# Patient Record
Sex: Female | Born: 1960 | Race: Black or African American | Hispanic: No | Marital: Married
Health system: Midwestern US, Academic
[De-identification: ages and names within clinical notes are randomized; demographics above are authoritative.]

## PROBLEM LIST (undated history)

## (undated) DIAGNOSIS — F32A Depression, unspecified: Secondary | ICD-10-CM

## (undated) DIAGNOSIS — F329 Major depressive disorder, single episode, unspecified: Secondary | ICD-10-CM

## (undated) DIAGNOSIS — J189 Pneumonia, unspecified organism: Secondary | ICD-10-CM

## (undated) DIAGNOSIS — F319 Bipolar disorder, unspecified: Secondary | ICD-10-CM

## (undated) DIAGNOSIS — E119 Type 2 diabetes mellitus without complications: Secondary | ICD-10-CM

## (undated) DIAGNOSIS — I959 Hypotension, unspecified: Secondary | ICD-10-CM

## (undated) DIAGNOSIS — D689 Coagulation defect, unspecified: Secondary | ICD-10-CM

## (undated) DIAGNOSIS — C801 Malignant (primary) neoplasm, unspecified: Secondary | ICD-10-CM

## (undated) DIAGNOSIS — K219 Gastro-esophageal reflux disease without esophagitis: Secondary | ICD-10-CM

## (undated) DIAGNOSIS — F419 Anxiety disorder, unspecified: Secondary | ICD-10-CM

## (undated) DIAGNOSIS — R0602 Shortness of breath: Secondary | ICD-10-CM

## (undated) DIAGNOSIS — I82409 Acute embolism and thrombosis of unspecified deep veins of unspecified lower extremity: Secondary | ICD-10-CM

## (undated) DIAGNOSIS — M1990 Unspecified osteoarthritis, unspecified site: Secondary | ICD-10-CM

## (undated) DIAGNOSIS — I10 Essential (primary) hypertension: Secondary | ICD-10-CM

## (undated) HISTORY — PX: ABLATION: SHX5711

## (undated) HISTORY — PX: TUBAL LIGATION: SHX77

## (undated) HISTORY — PX: CHOLECYSTECTOMY: SHX55

## (undated) HISTORY — PX: CARPAL TUNNEL RELEASE: SHX101

## (undated) HISTORY — DX: Coagulation defect, unspecified: D68.9

---

## 2009-06-22 NOTE — ED Provider Notes (Unsigned)
PATIENT NAME                  PA #             MR #                  Valerie Thompson, Valerie Thompson               [id number]       [id number]            EMERGENCY ROOM PHYSICIAN                 ADM DATE                     Burr Medico, MD                        06/22/2009                   DATE OF BIRTH    AGE            PATIENT TYPE      RM #               1960/12/17       48             ERK                                        REASON FOR VISIT:  Shortness of breath.     HISTORY OF PRESENT ILLNESS:  The patient is a 48 year old who presents to the  emergency department with shortness of breath and a productive cough going on  for about 3 days.  Denies having nausea, vomiting, diarrhea.  No fevers or  chills.  No headaches.  No back pain.  Denies chest pain.  A little bit of  abdominal cramping that comes and goes, but none currently.  No leg pain,  swelling, or rashes.     PAST MEDICAL HISTORY:  Significant for mental illness, diabetes,  osteoarthritis, hypertension.     MEDICATIONS:  Seroquel and others; she is not sure of them.     ALLERGIES:  PENICILLIN, ASPIRIN, and, IBUPROFEN.     SOCIAL HISTORY:  The patient does smoke and does drink.     FAMILY HISTORY:  Noncontributory.     REVIEW OF SYSTEMS:    CONSITUTIONAL:  No fevers or chills.  HEENT:  No headache, no neck pain.    MUSCULOSKELETAL:  No back pain.  No leg pain, swelling, or rashes.  GU:  No burning or discomfort with urination.     PHYSICAL EXAMINATION:   VITAL SIGNS:  Temperature is 98.9, pulse is 110, respirations 18, blood  pressure 107/78, saturating 96% on room air.  GENERAL:  She is awake, alert, well-developed, well-nourished.  She is in no  obvious distress.  HEENT:  Normocephalic.  Ears, nose, and throat were clear.  NECK:  Supple.  HEART:  Slightly tachycardic.  LUNGS:  Diminished bilaterally, some wheezing throughout.  ABDOMEN:  Soft, nontender, nondistended.  Positive bowel sounds.  EXTREMITIES:  No clubbing.  No cyanosis.  No edema.    NEUROLOGIC:   She is neurologically intact.     LABORATORY DATA:  WBC 17, hemoglobin 13 and hematocrit 42, platelets are 205.   Electrolytes unremarkable.  Amylase and lipase were normal.      EMERGENCY DEPARTMENT COURSE:  Chest x-ray with increased _____ right lower  lobe.     ASSESSMENT:  A 48 year old with right lower lobe pneumonia, likely right  lower lobe infiltrate; will give her Avelox with _____.  She is feeling much  better.  I am going to go ahead and send her home with codeine, an albuterol  inhaler, and _____.  Instructed to follow up.                                 Burr Medico, MD     Ernestina Penna /[id number]  DD: 06/22/2009 21:06  DT: 06/22/2009 23:52  Job #: [id number]

## 2009-06-23 LAB — COMPREHENSIVE METABOLIC PANEL
ALT: 18 U/L (ref 10–40)
AST: 22 U/L (ref 15–37)
Albumin/Globulin Ratio: 1.2 (ref 1.1–2.2)
Albumin: 4.7 g/dL (ref 3.4–5.0)
Alkaline Phosphatase: 47 U/L
BUN: 15 mg/dL (ref 7–18)
CO2: 28 meq/L (ref 21–32)
Calcium: 10.5 mg/dL (ref 8.3–10.6)
Chloride: 99 meq/L (ref 99–110)
Creatinine: 1 mg/dL (ref 0.6–1.1)
GFR Est, African/Amer: >60
GFR, Estimated: >60 (ref >60)
Glucose: 93 mg/dL (ref 70–99)
Potassium: 4.1 meq/L (ref 3.5–5.1)
Sodium: 137 meq/L (ref 136–145)
Total Bilirubin: 0.3 mg/dL (ref 0.0–1.0)
Total Protein: 8.7 g/dL — ABNORMAL HIGH (ref 6.4–8.2)

## 2009-06-23 LAB — CBC WITH AUTO DIFFERENTIAL
Eosinophils %: 2 % (ref 0.0–5.0)
Eosinophils Absolute: 0.3 10*3 (ref 0.0–0.6)
Granulocyte Absolute Count: 10.4 10*3 — ABNORMAL HIGH (ref 1.7–7.7)
Hematocrit: 42 % (ref 36.0–48.0)
Hemoglobin: 13.6 g/dL (ref 12.0–16.0)
Lymphocytes %: 29 % (ref 25.0–40.0)
Lymphocytes Absolute: 5 10*3 (ref 1.0–5.1)
MCH: 24.6 pg — ABNORMAL LOW (ref 26–34)
MCHC: 32.5 g/dL (ref 31–36)
MCV: 75.7 fl — ABNORMAL LOW (ref 80–100)
MPV: 8.2 fl (ref 5.0–10.5)
Monocytes %: 8 % (ref 0.0–8.0)
Monocytes Absolute: 1.4 10*3 — ABNORMAL HIGH (ref 0.0–0.95)
Platelets: 205 10*3 (ref 135–450)
RBC Morphology: NORMAL
RBC: 5.55 10*6 — ABNORMAL HIGH (ref 4.0–5.2)
RDW: 15 % — ABNORMAL HIGH (ref 11.5–14.5)
Segs Relative: 61 % (ref 42.0–63.0)
WBC Morphology: NORMAL
WBC: 17.1 10*3 — ABNORMAL HIGH (ref 4.0–11.0)

## 2009-06-23 LAB — LIPASE: Lipase: 28.47 U/L (ref 5.6–51.3)

## 2009-06-23 LAB — AMYLASE: Amylase: 52 U/L (ref 25–115)

## 2009-11-29 ENCOUNTER — Inpatient Hospital Stay

## 2009-11-30 NOTE — Unmapped (Signed)
THE [facility]                                 [address]                             [place], [place] [address]       PATIENT NAME:   Kathy Camacho, Kathy Camacho                MR #:  [id number]   DATE OF BIRTH:  Jul 09, 1961                        ACCOUNT #:  [id number]   PRIMARY:        Dorian Furnace, M.D.          ROOM #:   REFERRING:      Dorian Furnace, M.D.          NURSING UNIT:   DICTATED BY:    Lynden Oxford, PA-C         FC:  D   VISIT DATE:                                       ADMIT DATE:  11/29/2009                                                     DISCHARGE DATE:                                       CLINIC NOTE     *-*-*   CHIEF COMPLAINT: Right knee pain.     HISTORY OF PRESENT ILLNESS:  The patient is a 48 year old African-American   female who comes to the orthopedic joint clinic today for evaluation of her   right knee pain.  The patient states that her pain has been occurring for   about the past year. She states that it started suddenly while she was   walking up and down the stairs repeatedly. She said initially it felt like a   tearing sensation.  She notes that the pain has been off and on over the past   year and it comes and goes and varies in intensity. The pain is located all   over the front of the knee.  At times it radiates up the knee to the hip and   occasionally down to the lower leg. She currently describes it as a pressure   like sensation, rates it a 5/10 but states she did take some medication this   morning for pain relief. She notes it is difficult to lie on her right side   due to the pain.  Her pain is worse with standing, walking and stair   climbing.  The pain is improved with rest and pain medication.  She was given  a prescription by her primary care physician for Percocet and she has also   tried Vicodin in the past both with relief. She has tried heat and warm  baths   with little relief.  She complains of numbness and tingling of bilateral feet   and arms. She notes that her right leg is weaker and occasionally feels like   it will give out on her. She notes that she also was given a sample of some   kind of liquid that she pours over her knee. She states this decreases the   pain but increases the pressure. The patient was unsure of the name of this   liquid.  She notes she has had no other treatment for her knee pain.  No   physical therapy and no surgical treatment except for one cortisone injection   about 6-7 years ago.  She states at that time she was told she had arthritis   in her knee. She had the injection.  Her pain went away until about a year   ago.  This pain feels similar to that pain in the past.  The patient states   today that she does not want another cortisone injection.     PAST MEDICAL HISTORY:     1. Hypertension.   2. Diabetes.   3. Manic depression.   4. History of DVT.     MEDICATIONS:     1. Actos.   2. Metformin.   3. Seroquel.   4. Aspirin.   5. Lantus injections.     PAST SURGICAL HISTORY:     1. Cholecystectomy.   2. Tubal ligation.     ALLERGIES:     1.  Penicillin (hives, difficulty breathing).     SOCIAL HISTORY: The patient smokes four Black and Milds a day and has been   smoking off and on for several years.  She drinks alcohol occasionally and   smokes marijuana daily.  She does not work. She is on social security   disability for her manic depression.     PHYSICAL EXAMINATION:     GENERAL: The patient is moderately obese, well-developed, appears stated age,   sitting on exam table in no acute distress.   MUSCULOSKELETAL: The patient ambulates with normal gait and balance.  On   inspection of the right knee there is no erythema, edema, cellulitis or   ecchymosis.  There is no joint effusion noted.  The patient is tender to   palpation along the lateral patella and tibial patella tendon.  She has full   range of motion of the right knee  with full extension to flexion of 120   degrees with minimal pain. She has a negative anterior drawer test, negative   Lachman's and McBurney's.  No laxity noted on varus or valgus stressing. She   has 5/5 motor strength with right knee flexion and extension and ankle   dorsiflexion and plantar flexion.  Gross sensation is intact distally and   proximally throughout the right lower extremity to light touch.  She has   palpable posterior tibial pulses.  No abnormal warmth of the skin.     X-RAYS: AP and lateral right knee films show no fractures noted.  Minimally   decreased joint space.  No effusion noted.  Inferior patella osteophytes.     ASSESSMENT:     1. Right knee patellar tendonitis.     PLAN:     1. Discussed the above with the  patient saying that we would like her to     start some formal physical therapy.   2. I offered her a steroid injection today but the patient declined.   3. Explained she should take over-the-counter NSAIDS as needed for her pain.     She explains that she did get a prescription in the past from her primary     care physician which she thinks was Naprosyn.  She states she only took one     and stopped taking them because she was afraid to be on any medication.     She states that she will call her primary care physician to see if she     should restart this medication if it is Naprosyn.   4. Recommended that she try ice and heat as needed.   5. We will have her follow-up in three months and the patient states she may     consider an injection at that time.     Answered all the patient's questions to her satisfaction and understanding.   She is in agreement with plan.     *-*-*                                             _______________________________________   ES/saw                                 _____   D:  11/29/2009 14:42                   Lynden Oxford, PA-C   T:  11/30/2009 15:13   Job #:  [id number]                                           CLINIC NOTE                                                                 PAGE    1 of   1                                                                PAGE    1 of   1

## 2009-12-13 ENCOUNTER — Inpatient Hospital Stay

## 2010-02-28 ENCOUNTER — Inpatient Hospital Stay

## 2012-06-07 NOTE — Telephone Encounter (Addendum)
The pharmacist states that the patients Hydrochlorothiazide and metformin need to be filled. She states to fax the medication refills to [phone].    Valerie Thompson)

## 2012-08-05 NOTE — Telephone Encounter (Signed)
Pt. Needs refill on bd pen needles and lorazepam 0.5mg .

## 2012-10-02 ENCOUNTER — Emergency Department (HOSPITAL_COMMUNITY)
Admission: EM | Admit: 2012-10-02 | Discharge: 2012-10-02 | Disposition: A | Discharge status: Home / Self Care | Payer: Medicaid Other | Attending: Emergency Medicine | Admitting: Emergency Medicine

## 2012-10-02 ENCOUNTER — Encounter (HOSPITAL_COMMUNITY): Payer: Self-pay | Admitting: Physical Medicine and Rehabilitation

## 2012-10-02 DIAGNOSIS — M19039 Primary osteoarthritis, unspecified wrist: Secondary | ICD-10-CM | POA: Insufficient documentation

## 2012-10-02 DIAGNOSIS — Z7982 Long term (current) use of aspirin: Secondary | ICD-10-CM | POA: Insufficient documentation

## 2012-10-02 DIAGNOSIS — Z794 Long term (current) use of insulin: Secondary | ICD-10-CM | POA: Insufficient documentation

## 2012-10-02 DIAGNOSIS — I10 Essential (primary) hypertension: Secondary | ICD-10-CM | POA: Insufficient documentation

## 2012-10-02 DIAGNOSIS — M1990 Unspecified osteoarthritis, unspecified site: Secondary | ICD-10-CM

## 2012-10-02 DIAGNOSIS — Z79899 Other long term (current) drug therapy: Secondary | ICD-10-CM | POA: Insufficient documentation

## 2012-10-02 DIAGNOSIS — E119 Type 2 diabetes mellitus without complications: Secondary | ICD-10-CM | POA: Insufficient documentation

## 2012-10-02 HISTORY — DX: Essential (primary) hypertension: I10

## 2012-10-02 HISTORY — DX: Unspecified osteoarthritis, unspecified site: M19.90

## 2012-10-02 HISTORY — DX: Type 2 diabetes mellitus without complications: E11.9

## 2012-10-02 MED ORDER — OXYCODONE-ACETAMINOPHEN 5-325 MG PO TABS
2.0000 | ORAL_TABLET | Freq: Once | ORAL | Status: AC
  Administered 2012-10-02: 2 via ORAL
  Filled 2012-10-02: qty 2

## 2012-10-02 MED ORDER — OXYCODONE-ACETAMINOPHEN 5-325 MG PO TABS: 2.0000 | ORAL_TABLET | ORAL | Status: DC | PRN

## 2012-10-02 MED ORDER — PREDNISONE 10 MG PO TABS: 20.0000 mg | ORAL_TABLET | Freq: Every day | ORAL | Status: DC

## 2012-10-02 MED ORDER — PREDNISONE 20 MG PO TABS
60.0000 mg | ORAL_TABLET | Freq: Once | ORAL | Status: AC
  Administered 2012-10-02: 60 mg via ORAL
  Filled 2012-10-02: qty 3

## 2012-10-02 NOTE — ED Provider Notes (Addendum)
History   This chart was scribed for Toy Baker, MD by Charolett Bumpers . The patient was seen in room TR09C/TR09C. Patient's care was started at 1249.   CSN: [id number] Arrival date & time 10/02/12  1229  First MD Initiated Contact with Patient 10/02/12 1249      Chief Complaint  Patient presents with  . Arm Pain    The history is provided by the patient. No language interpreter was used.   Julianna Vanwagner is a 51 y.o. female who presents to the Emergency Department complaining of right wrist pain that radiates to elbow that started 2 days ago. She reports associated swelling. She reports her symptoms are worsening. She denies any known injuries. 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    Past Medical History  Diagnosis Date  . Hypertension   . Arthritis   . Diabetes mellitus without complication     No past surgical history on file.  No family history on file.  History  Substance Use Topics  . Smoking status: Never Smoker   . Smokeless tobacco: Not on file  . Alcohol Use: No    OB History    Grav Para Term Preterm Abortions TAB SAB Ect Mult Living                  Review of Systems  Constitutional: Negative for fever and chills.  Respiratory: Negative for shortness of breath.   Gastrointestinal: Negative for nausea and vomiting.  Musculoskeletal: Positive for joint swelling and arthralgias.       Right wrist pain and swelling.  Neurological: Negative for weakness.  All other systems reviewed and are negative.    Allergies  Aspirin; Ibuprofen; and Penicillins  Home Medications   Current Outpatient Rx  Name Route Sig Dispense Refill  . ASPIRIN EC 81 MG PO TBEC Oral Take 81 mg by mouth daily.    Marland Kitchen GLIPIZIDE 5 MG PO TABS Oral Take 10 mg by mouth 2 (two) times daily before a meal.    . HYDROCHLOROTHIAZIDE 25 MG PO TABS Oral Take 25 mg by mouth daily.    . INDOMETHACIN 50 MG PO CAPS  Oral Take 50 mg by mouth 2 (two) times daily with a meal.    . INSULIN GLARGINE 100 UNIT/ML [place] SOLN Subcutaneous Inject 20 Units into the skin at bedtime.    Marland Kitchen LISINOPRIL 40 MG PO TABS Oral Take 40 mg by mouth daily.    Marland Kitchen LORAZEPAM 0.5 MG PO TABS Oral Take 0.5 mg by mouth 2 (two) times daily as needed. For nerves    . METFORMIN HCL 500 MG PO TABS Oral Take 1,000 mg by mouth 2 (two) times daily with a meal.    . PIOGLITAZONE HCL 30 MG PO TABS Oral Take 30 mg by mouth daily.      BP 186/82  Pulse 76  Temp 97.7 F (36.5 C) (Oral)  Resp 18  SpO2 95%  Physical Exam  Nursing note and vitals reviewed. Constitutional: She is oriented to person, place, and time. She appears well-developed and well-nourished.  Non-toxic appearance.  HENT:  Head: Normocephalic and atraumatic.  Eyes: Conjunctivae normal are normal. Pupils are equal, round, and reactive to light.  Neck: Normal range of motion.  Cardiovascular: Normal rate.   Pulmonary/Chest: Effort normal.  Musculoskeletal: She exhibits edema and tenderness.       Right wrist with edema and tenderness with  ROM. Skin intact. No erythema. Neurovascularly intact in right hand.   Neurological: She is alert and oriented to person, place, and time.  Skin: Skin is warm and dry.  Psychiatric: She has a normal mood and affect.    ED Course  Procedures (including critical care time)  DIAGNOSTIC STUDIES: Oxygen Saturation is 95% on room air, adequate by my interpretation.    COORDINATION OF CARE:  12:59-Discussed planned course of treatment with the patient including treatment with pain medication and prednisone, who is agreeable at this time.   13:15-Medication Orders: Oxycodone-acetaminophen (Percocet/Roxicet) 5-325 mg per tablet 2 tablet-once; Prednisone (Deltasone) tablet 60 mg-once.    Labs Reviewed - No data to display No results found.   No diagnosis found.    MDM  As above  I personally performed the services described in this  documentation, which was scribed in my presence. The recorded information has been reviewed and is accurate.       Toy Baker, MD 10/18/12 1610  Toy Baker, MD 12/11/12 [phone]

## 2012-10-02 NOTE — ED Notes (Signed)
Pt presents to department for evaluation of R arm pain. Ongoing x2 days. Pt reports swelling and increased pain. 10/10 upon arrival. Denies recent injury.

## 2013-07-29 NOTE — Telephone Encounter (Signed)
Mailed health maintaince letter to pt that she is due for mammagram and colonscopy   The letter came back we have wrong address tried to reach pt to find new address her phone keeps sayin not avalable at this time  Unable to reach pt

## 2013-09-22 ENCOUNTER — Encounter: Payer: Self-pay | Admitting: Obstetrics

## 2013-10-21 ENCOUNTER — Ambulatory Visit: Payer: Self-pay | Admitting: Advanced Practice Midwife

## 2014-03-25 ENCOUNTER — Encounter: Payer: Self-pay | Admitting: Advanced Practice Midwife

## 2014-04-21 ENCOUNTER — Ambulatory Visit: Payer: Medicaid Other | Admitting: Advanced Practice Midwife

## 2014-08-12 ENCOUNTER — Inpatient Hospital Stay (HOSPITAL_COMMUNITY)
Admission: EM | Admit: 2014-08-12 | Discharge: 2014-08-13 | DRG: 181 | Disposition: A | Discharge status: Home / Self Care | Payer: Medicaid Other | Attending: Internal Medicine | Admitting: Internal Medicine

## 2014-08-12 ENCOUNTER — Emergency Department (HOSPITAL_COMMUNITY): Payer: Medicaid Other

## 2014-08-12 ENCOUNTER — Encounter (HOSPITAL_COMMUNITY): Payer: Self-pay | Admitting: Emergency Medicine

## 2014-08-12 DIAGNOSIS — Z7982 Long term (current) use of aspirin: Secondary | ICD-10-CM

## 2014-08-12 DIAGNOSIS — D3502 Benign neoplasm of left adrenal gland: Secondary | ICD-10-CM

## 2014-08-12 DIAGNOSIS — R222 Localized swelling, mass and lump, trunk: Secondary | ICD-10-CM

## 2014-08-12 DIAGNOSIS — E119 Type 2 diabetes mellitus without complications: Secondary | ICD-10-CM | POA: Diagnosis present

## 2014-08-12 DIAGNOSIS — C342 Malignant neoplasm of middle lobe, bronchus or lung: Principal | ICD-10-CM | POA: Diagnosis present

## 2014-08-12 DIAGNOSIS — R918 Other nonspecific abnormal finding of lung field: Secondary | ICD-10-CM

## 2014-08-12 DIAGNOSIS — Z794 Long term (current) use of insulin: Secondary | ICD-10-CM

## 2014-08-12 DIAGNOSIS — M129 Arthropathy, unspecified: Secondary | ICD-10-CM | POA: Diagnosis present

## 2014-08-12 DIAGNOSIS — M542 Cervicalgia: Secondary | ICD-10-CM | POA: Diagnosis present

## 2014-08-12 DIAGNOSIS — Z6841 Body Mass Index (BMI) 40.0 and over, adult: Secondary | ICD-10-CM

## 2014-08-12 DIAGNOSIS — J449 Chronic obstructive pulmonary disease, unspecified: Secondary | ICD-10-CM | POA: Diagnosis present

## 2014-08-12 DIAGNOSIS — M549 Dorsalgia, unspecified: Secondary | ICD-10-CM | POA: Diagnosis present

## 2014-08-12 DIAGNOSIS — R911 Solitary pulmonary nodule: Secondary | ICD-10-CM | POA: Diagnosis present

## 2014-08-12 DIAGNOSIS — J4489 Other specified chronic obstructive pulmonary disease: Secondary | ICD-10-CM | POA: Diagnosis present

## 2014-08-12 DIAGNOSIS — R599 Enlarged lymph nodes, unspecified: Secondary | ICD-10-CM | POA: Diagnosis present

## 2014-08-12 DIAGNOSIS — G609 Hereditary and idiopathic neuropathy, unspecified: Secondary | ICD-10-CM | POA: Diagnosis present

## 2014-08-12 DIAGNOSIS — C3490 Malignant neoplasm of unspecified part of unspecified bronchus or lung: Secondary | ICD-10-CM | POA: Diagnosis present

## 2014-08-12 DIAGNOSIS — I10 Essential (primary) hypertension: Secondary | ICD-10-CM

## 2014-08-12 DIAGNOSIS — IMO0001 Reserved for inherently not codable concepts without codable children: Secondary | ICD-10-CM

## 2014-08-12 DIAGNOSIS — D3501 Benign neoplasm of right adrenal gland: Secondary | ICD-10-CM

## 2014-08-12 DIAGNOSIS — F17200 Nicotine dependence, unspecified, uncomplicated: Secondary | ICD-10-CM | POA: Diagnosis present

## 2014-08-12 DIAGNOSIS — E669 Obesity, unspecified: Secondary | ICD-10-CM | POA: Diagnosis present

## 2014-08-12 LAB — CBC WITH DIFFERENTIAL/PLATELET
BASOS ABS: 0 10*3/uL (ref 0.0–0.1)
Basophils Relative: 0 % (ref 0–1)
Eosinophils Absolute: 0.2 10*3/uL (ref 0.0–0.7)
Eosinophils Relative: 1 % (ref 0–5)
HEMATOCRIT: 39.8 % (ref 36.0–46.0)
Hemoglobin: 13.3 g/dL (ref 12.0–15.0)
LYMPHS PCT: 32 % (ref 12–46)
Lymphs Abs: 4.2 10*3/uL — ABNORMAL HIGH (ref 0.7–4.0)
MCH: 24.2 pg — ABNORMAL LOW (ref 26.0–34.0)
MCHC: 33.4 g/dL (ref 30.0–36.0)
MCV: 72.5 fL — ABNORMAL LOW (ref 78.0–100.0)
Monocytes Absolute: 0.6 10*3/uL (ref 0.1–1.0)
Monocytes Relative: 5 % (ref 3–12)
NEUTROS ABS: 8 10*3/uL — AB (ref 1.7–7.7)
Neutrophils Relative %: 62 % (ref 43–77)
PLATELETS: 219 10*3/uL (ref 150–400)
RBC: 5.49 MIL/uL — ABNORMAL HIGH (ref 3.87–5.11)
RDW: 14.2 % (ref 11.5–15.5)
WBC: 12.9 10*3/uL — AB (ref 4.0–10.5)

## 2014-08-12 LAB — BASIC METABOLIC PANEL
ANION GAP: 13 (ref 5–15)
BUN: 14 mg/dL (ref 6–23)
CHLORIDE: 95 meq/L — AB (ref 96–112)
CO2: 26 meq/L (ref 19–32)
Calcium: 9.5 mg/dL (ref 8.4–10.5)
Creatinine, Ser: 0.78 mg/dL (ref 0.50–1.10)
GFR calc Af Amer: >90 mL/min (ref >90)
GFR calc non Af Amer: >90 mL/min (ref >90)
Glucose, Bld: 334 mg/dL — ABNORMAL HIGH (ref 70–99)
Potassium: 3.6 mEq/L — ABNORMAL LOW (ref 3.7–5.3)
SODIUM: 134 meq/L — AB (ref 137–147)

## 2014-08-12 LAB — I-STAT TROPONIN, ED: TROPONIN I, POC: 0 ng/mL (ref 0.00–0.08)

## 2014-08-12 MED ORDER — INSULIN GLARGINE 100 UNIT/ML ~~LOC~~ SOLN
35.0000 [IU] | Freq: Every day | SUBCUTANEOUS | Status: DC
  Administered 2014-08-12: 35 [IU] via SUBCUTANEOUS
  Filled 2014-08-12 (×2): qty 0.35

## 2014-08-12 MED ORDER — LISINOPRIL 40 MG PO TABS
40.0000 mg | ORAL_TABLET | Freq: Every day | ORAL | Status: DC
  Administered 2014-08-12 – 2014-08-13 (×2): 40 mg via ORAL
  Filled 2014-08-12: qty 1
  Filled 2014-08-12: qty 2

## 2014-08-12 MED ORDER — OXYCODONE-ACETAMINOPHEN 5-325 MG PO TABS
1.0000 | ORAL_TABLET | Freq: Once | ORAL | Status: AC
  Administered 2014-08-12: 1 via ORAL
  Filled 2014-08-12: qty 1

## 2014-08-12 MED ORDER — GLIPIZIDE 10 MG PO TABS
10.0000 mg | ORAL_TABLET | Freq: Two times a day (BID) | ORAL | Status: DC
  Administered 2014-08-13 (×2): 10 mg via ORAL
  Filled 2014-08-12 (×3): qty 1

## 2014-08-12 MED ORDER — LORAZEPAM 0.5 MG PO TABS
0.5000 mg | ORAL_TABLET | Freq: Two times a day (BID) | ORAL | Status: DC | PRN
  Administered 2014-08-12: 0.5 mg via ORAL
  Filled 2014-08-12: qty 1

## 2014-08-12 MED ORDER — PIOGLITAZONE HCL 30 MG PO TABS
30.0000 mg | ORAL_TABLET | Freq: Every day | ORAL | Status: DC
  Administered 2014-08-13: 30 mg via ORAL
  Filled 2014-08-12: qty 1

## 2014-08-12 MED ORDER — INDOMETHACIN 50 MG PO CAPS
50.0000 mg | ORAL_CAPSULE | Freq: Two times a day (BID) | ORAL | Status: DC
  Administered 2014-08-13: 50 mg via ORAL
  Filled 2014-08-12 (×3): qty 1

## 2014-08-12 MED ORDER — METFORMIN HCL 500 MG PO TABS
1000.0000 mg | ORAL_TABLET | Freq: Two times a day (BID) | ORAL | Status: DC
  Administered 2014-08-13: 1000 mg via ORAL
  Filled 2014-08-12 (×4): qty 2

## 2014-08-12 MED ORDER — LORAZEPAM 1 MG PO TABS
1.0000 mg | ORAL_TABLET | Freq: Once | ORAL | Status: AC
  Administered 2014-08-12: 1 mg via ORAL
  Filled 2014-08-12: qty 1

## 2014-08-12 MED ORDER — ASPIRIN EC 81 MG PO TBEC
81.0000 mg | DELAYED_RELEASE_TABLET | Freq: Every day | ORAL | Status: DC
  Administered 2014-08-12 – 2014-08-13 (×2): 81 mg via ORAL
  Filled 2014-08-12 (×2): qty 1

## 2014-08-12 MED ORDER — GABAPENTIN 100 MG PO CAPS
100.0000 mg | ORAL_CAPSULE | Freq: Three times a day (TID) | ORAL | Status: DC
  Administered 2014-08-12 – 2014-08-13 (×3): 100 mg via ORAL
  Filled 2014-08-12 (×4): qty 1

## 2014-08-12 MED ORDER — QUETIAPINE FUMARATE 50 MG PO TABS
50.0000 mg | ORAL_TABLET | Freq: Every day | ORAL | Status: DC
  Administered 2014-08-12: 50 mg via ORAL
  Filled 2014-08-12 (×2): qty 1

## 2014-08-12 MED ORDER — HEPARIN SODIUM (PORCINE) 5000 UNIT/ML IJ SOLN
5000.0000 [IU] | Freq: Three times a day (TID) | INTRAMUSCULAR | Status: DC
  Administered 2014-08-12 – 2014-08-13 (×3): 5000 [IU] via SUBCUTANEOUS
  Filled 2014-08-12 (×6): qty 1

## 2014-08-12 MED ORDER — CYCLOBENZAPRINE HCL 10 MG PO TABS
10.0000 mg | ORAL_TABLET | Freq: Once | ORAL | Status: AC
  Administered 2014-08-12: 10 mg via ORAL
  Filled 2014-08-12: qty 1

## 2014-08-12 MED ORDER — ADULT MULTIVITAMIN W/MINERALS CH
1.0000 | ORAL_TABLET | Freq: Every day | ORAL | Status: DC
  Administered 2014-08-12 – 2014-08-13 (×2): 1 via ORAL
  Filled 2014-08-12 (×2): qty 1

## 2014-08-12 MED ORDER — HYDROCHLOROTHIAZIDE 25 MG PO TABS
25.0000 mg | ORAL_TABLET | Freq: Every day | ORAL | Status: DC
  Administered 2014-08-12 – 2014-08-13 (×2): 25 mg via ORAL
  Filled 2014-08-12 (×2): qty 1

## 2014-08-12 NOTE — H&P (Signed)
[facility] History and Physical  Sabrina Adkins PYK:[id number] DOB: 1961/10/12 DOA: 08/12/2014  Referring physician: EDP PCP: Default, Provider, MD   Chief Complaint: Back pain   HPI: Sabrina Adkins is a 52 y.o. female who presents to the ED for musculoskeletal complaints including back, neck and chest pain.  While here she had a routine CXR which demonstrated a mass in her chest.  Follow up CT demonstrates what appears to be bronchogenic carcinoma with lymph node spread at least.  Patient has a smoking history.  Review of Systems: Systems reviewed.  As above, otherwise negative  Past Medical History  Diagnosis Date  . Hypertension   . Arthritis   . Diabetes mellitus without complication    History reviewed. No pertinent past surgical history. Social History:  reports that she has never smoked. She does not have any smokeless tobacco history on file. She reports that she does not drink alcohol or use illicit drugs.  Allergies  Allergen Reactions  . Aspirin Hives, Shortness Of Breath, Itching and Rash    Okay to tolerate coated aspirin-MUST BE E.C.  . Ibuprofen Hives, Shortness Of Breath, Itching and Rash  . Penicillins Anaphylaxis and Hives    THROAT CLOSES  . Iodine Hives, Itching and Rash  . Latex Hives, Itching and Rash    No family history on file.   Prior to Admission medications   Medication Sig Start Date End Date Taking? Authorizing Provider  aspirin EC 81 MG tablet Take 81 mg by mouth daily.   Yes Historical Provider, MD  Aspirin-Salicylamide-Caffeine (BC HEADACHE POWDER PO) Take 1 Package by mouth daily as needed (for pain).   Yes Historical Provider, MD  gabapentin (NEURONTIN) 100 MG capsule Take 100 mg by mouth 3 (three) times daily.   Yes Historical Provider, MD  glipiZIDE (GLUCOTROL) 5 MG tablet Take 10 mg by mouth 2 (two) times daily before a meal.   Yes Historical Provider, MD  hydrochlorothiazide (HYDRODIURIL) 25 MG tablet Take 25 mg by mouth daily.    Yes Historical Provider, MD  indomethacin (INDOCIN) 50 MG capsule Take 50 mg by mouth 2 (two) times daily with a meal.   Yes Historical Provider, MD  insulin glargine (LANTUS) 100 UNIT/ML injection Inject 35 Units into the skin at bedtime.    Yes Historical Provider, MD  lisinopril (PRINIVIL,ZESTRIL) 40 MG tablet Take 40 mg by mouth daily.   Yes Historical Provider, MD  LORazepam (ATIVAN) 0.5 MG tablet Take 0.5 mg by mouth 2 (two) times daily as needed. For nerves   Yes Historical Provider, MD  metFORMIN (GLUCOPHAGE) 500 MG tablet Take 1,000 mg by mouth 2 (two) times daily with a meal.   Yes Historical Provider, MD  Multiple Vitamin (MULTIVITAMIN WITH MINERALS) TABS tablet Take 1 tablet by mouth daily.   Yes Historical Provider, MD  pioglitazone (ACTOS) 30 MG tablet Take 30 mg by mouth daily.   Yes Historical Provider, MD  QUEtiapine (SEROQUEL) 50 MG tablet Take 50 mg by mouth at bedtime.   Yes Historical Provider, MD   Physical Exam: Filed Vitals:   08/12/14 1924  BP: 206/101  Pulse:   Temp:   Resp: 21    BP 206/101  Pulse 65  Temp(Src) 98.9 F (37.2 C) (Oral)  Resp 21  SpO2 99%  General Appearance:    Alert, oriented, no distress, appears stated age  Head:    Normocephalic, atraumatic  Eyes:    PERRL, EOMI, sclera non-icteric  Nose:   Nares without drainage or epistaxis. Mucosa, turbinates normal  Throat:   Moist mucous membranes. Oropharynx without erythema or exudate.  Neck:   Supple. No carotid bruits.  No thyromegaly.  No lymphadenopathy.   Back:     No CVA tenderness, no spinal tenderness  Lungs:     Clear to auscultation bilaterally, without wheezes, rhonchi or rales  Chest wall:    No tenderness to palpitation  Heart:    Regular rate and rhythm without murmurs, gallops, rubs  Abdomen:     Soft, non-tender, nondistended, normal bowel sounds, no organomegaly  Genitalia:    deferred  Rectal:    deferred  Extremities:   No clubbing, cyanosis or edema.  Pulses:   2+  and symmetric all extremities  Skin:   Skin color, texture, turgor normal, no rashes or lesions  Lymph nodes:   Cervical, supraclavicular, and axillary nodes normal  Neurologic:   CNII-XII intact. Normal strength, sensation and reflexes      throughout    Labs on Admission:  Basic Metabolic Panel:  Recent Labs Lab 08/12/14 1524  NA 134*  K 3.6*  CL 95*  CO2 26  GLUCOSE 334*  BUN 14  CREATININE 0.78  CALCIUM 9.5   Liver Function Tests: No results found for this basename: AST, ALT, ALKPHOS, BILITOT, PROT, ALBUMIN,  in the last 168 hours No results found for this basename: LIPASE, AMYLASE,  in the last 168 hours No results found for this basename: AMMONIA,  in the last 168 hours CBC:  Recent Labs Lab 08/12/14 1524  WBC 12.9*  NEUTROABS 8.0*  HGB 13.3  HCT 39.8  MCV 72.5*  PLT 219   Cardiac Enzymes: No results found for this basename: CKTOTAL, CKMB, CKMBINDEX, TROPONINI,  in the last 168 hours  BNP (last 3 results) No results found for this basename: PROBNP,  in the last 8760 hours CBG: No results found for this basename: GLUCAP,  in the last 168 hours  Radiological Exams on Admission: Dg Chest 2 View  08/12/2014   CLINICAL DATA:  Chest pain  EXAM: CHEST  2 VIEW  COMPARISON:  None.  FINDINGS: Cardiac shadow is within normal limits. The lungs are clear bilaterally. In the right mid lung projecting in the right middle lobe there is a 2.4 cm rounded nodule identified. No focal infiltrate or sizable effusion is seen. No definitive hilar adenopathy is seen. No bony abnormality is noted.  IMPRESSION: 2.4 cm nodule in the right middle lobe. CT of the chest is recommended for further evaluation.  These results were called by telephone at the time of interpretation on 08/12/2014 at 5:00 pm to [facility], [place], who verbally acknowledged these results.   Electronically Signed   By: Inez Catalina M.D.   On: 08/12/2014 17:03   Ct Chest Wo Contrast  08/12/2014   CLINICAL DATA:  Neck  and back pain.  Pulmonary nodule.  EXAM: CT CHEST WITHOUT CONTRAST  TECHNIQUE: Multidetector CT imaging of the chest was performed following the standard protocol without IV contrast.  COMPARISON:  None.  FINDINGS: THORACIC INLET/BODY WALL:  No acute abnormality.  MEDIASTINUM:  Normal heart size. No pericardial effusion. There is atherosclerosis of the aorta great vessels but no acute vascular findings. Proximal LAD atherosclerotic calcification. Subcarinal lymphadenopathy measuring 24 mm in diameter. Prevascular lymphadenopathy measuring 26 mm short axis. No visible supraclavicular adenopathy.  LUNG WINDOWS:  2.2 cm pulmonary nodule in the right middle lobe with lobulated and spiculated margins.  The mass has broad contact with the mildly deformed minor fissure. More inferiorly within the right middle lobe is ground-glass ill-defined nodule measuring 13 mm. There is 16 mm ground-glass nodule in the apical right upper lobe2 mm nodule in the superior segment right upper lobe, along the major fissure.  No acute findings such as pneumonia, edema, effusion, or pneumothorax.  UPPER ABDOMEN:  2.7 cm low-density nodule in the right adrenal gland consistent with adenoma. Two left adrenal nodules, also low-density, measuring up to 19 mm.  OSSEOUS:  No acute fracture.  No suspicious lytic or blastic lesions.  IMPRESSION: 1. 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 The nodule has broad contact with the minor fissure. There is also a 6 mm ground-glass nodule also in the right middle lobe. 2. 16 mm ground-glass nodule in the right upper lobe, suspicious for low-grade adenocarcinoma given #1. 3. 4 mm nodule along the upper right major fissure. 4. Atherosclerosis, including the coronary arteries. 5. Bilateral adrenal adenomas.   Electronically Signed   By: Jorje Guild M.D.   On: 08/12/2014 18:38    EKG: Independently  reviewed.  Assessment/Plan Principal Problem:   Pulmonary mass Active Problems:   Pulmonary nodule   1. 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   Code Status: Full Code  Family Communication: Spoke with daughter on phone, number is in chart. Disposition Plan: Admit to inpatient   Time spent: 70 min  GARDNER, JARED M. [facility] Pager [phone]  If 7AM-7PM, please contact the day team taking care of the patient [email or web address] Password [facility] 08/12/2014, 8:24 PM

## 2014-08-12 NOTE — ED Notes (Signed)
Patient consumed a diabetic meal bag.

## 2014-08-12 NOTE — ED Provider Notes (Signed)
CSN: [id number]     Arrival date & time 08/12/14  1420 History   First MD Initiated Contact with Patient 08/12/14 1500     Chief Complaint  Patient presents with  . Neck Pain  . Back Pain  . Chest Pain     (Consider location/radiation/quality/duration/timing/severity/associated sxs/prior Treatment) HPI  Sabrina Adkins is a(n) 53 y.o. female who presents with cc of back and neck pain. The patient has a history of chronic upper back and neck pain as well as a history of a "pinched nerve" in her neck. She states that she frequently has upper back and neck pain however last night it became acutely worse, she complains of pain in the occiput, and upper trapezius. She describes it as severe, tight, nonradiating. She did not take anything at home for the pain. She states she did take Tylenol on a week ago it was bothering her and it did not help. She states that normally she can go to bed and her pain will resolve however it has been constant for the past 12 hours. Denies fevers, chills, myalgias, arthralgias. Denies DOE, SOB, chest tightness or pressure, radiation to left arm, jaw or back, or diaphoresis. Denies dysuria, flank pain, suprapubic pain, frequency, urgency, or hematuria. Denies headaches, light headedness, weakness, visual disturbances. Denies abdominal pain, nausea, vomiting, diarrhea or constipation.     Past Medical History  Diagnosis Date  . Hypertension   . Arthritis   . Diabetes mellitus without complication    History reviewed. No pertinent past surgical history. No family history on file. History  Substance Use Topics  . Smoking status: Never Smoker   . Smokeless tobacco: Not on file  . Alcohol Use: No   OB History   Grav Para Term Preterm Abortions TAB SAB Ect Mult Living                 Review of Systems  Ten systems reviewed and are negative for acute change, except as noted in the HPI.    Allergies  Aspirin; Ibuprofen; Penicillins; Iodine; and  Latex  Home Medications   Prior to Admission medications   Medication Sig Start Date End Date Taking? Authorizing Provider  aspirin EC 81 MG tablet Take 81 mg by mouth daily.   Yes Historical Provider, MD  Aspirin-Salicylamide-Caffeine (BC HEADACHE POWDER PO) Take 1 Package by mouth daily as needed (for pain).   Yes Historical Provider, MD  gabapentin (NEURONTIN) 100 MG capsule Take 100 mg by mouth 3 (three) times daily.   Yes Historical Provider, MD  glipiZIDE (GLUCOTROL) 5 MG tablet Take 10 mg by mouth 2 (two) times daily before a meal.   Yes Historical Provider, MD  hydrochlorothiazide (HYDRODIURIL) 25 MG tablet Take 25 mg by mouth daily.   Yes Historical Provider, MD  indomethacin (INDOCIN) 50 MG capsule Take 50 mg by mouth 2 (two) times daily with a meal.   Yes Historical Provider, MD  insulin glargine (LANTUS) 100 UNIT/ML injection Inject 35 Units into the skin at bedtime.    Yes Historical Provider, MD  lisinopril (PRINIVIL,ZESTRIL) 40 MG tablet Take 40 mg by mouth daily.   Yes Historical Provider, MD  LORazepam (ATIVAN) 0.5 MG tablet Take 0.5 mg by mouth 2 (two) times daily as needed. For nerves   Yes Historical Provider, MD  metFORMIN (GLUCOPHAGE) 500 MG tablet Take 1,000 mg by mouth 2 (two) times daily with a meal.   Yes Historical Provider, MD  Multiple Vitamin (MULTIVITAMIN WITH MINERALS) TABS tablet  Take 1 tablet by mouth daily.   Yes Historical Provider, MD  pioglitazone (ACTOS) 30 MG tablet Take 30 mg by mouth daily.   Yes Historical Provider, MD  QUEtiapine (SEROQUEL) 50 MG tablet Take 50 mg by mouth at bedtime.   Yes Historical Provider, MD   BP 173/89  Pulse 75  Temp(Src) 98.9 F (37.2 C) (Oral)  Resp 15  SpO2 100% Physical Exam  Nursing note and vitals reviewed. Constitutional: She is oriented to person, place, and time. She appears well-developed and well-nourished. No distress.  HENT:  Head: Normocephalic and atraumatic.  Eyes: Conjunctivae are normal. No scleral  icterus.  Neck: Normal range of motion.  Cardiovascular: Normal rate, regular rhythm and normal heart sounds.  Exam reveals no gallop and no friction rub.   No murmur heard. Pulmonary/Chest: Effort normal and breath sounds normal. No respiratory distress. She exhibits tenderness.  Tenderness to palpation along the precordium. Chest anterior rounding of the shoulders. Patient is supporting extremely large pendulous breasts with the last on her thighs when sitting.  Abdominal: Soft. Bowel sounds are normal. She exhibits no distension and no mass. There is no tenderness. There is no guarding.  Musculoskeletal:  Reproducible pain with palpation of the suboccipital muscles and trapezius muscles. No upper extremity weakness.  Neurological: She is alert and oriented to person, place, and time.  Skin: Skin is warm and dry. She is not diaphoretic.  Psychiatric: Her behavior is normal.    ED Course  Procedures (including critical care time) Labs Review Labs Reviewed  CBC WITH DIFFERENTIAL - Abnormal; Notable for the following:    WBC 12.9 (*)    RBC 5.49 (*)    MCV 72.5 (*)    MCH 24.2 (*)    Neutro Abs 8.0 (*)    Lymphs Abs 4.2 (*)    All other components within normal limits  BASIC METABOLIC PANEL - Abnormal; Notable for the following:    Sodium 134 (*)    Potassium 3.6 (*)    Chloride 95 (*)    Glucose, Bld 334 (*)    All other components within normal limits  I-STAT TROPOININ, ED    Imaging Review Dg Chest 2 View  08/12/2014   CLINICAL DATA:  Chest pain  EXAM: CHEST  2 VIEW  COMPARISON:  None.  FINDINGS: Cardiac shadow is within normal limits. The lungs are clear bilaterally. In the right mid lung projecting in the right middle lobe there is a 2.4 cm rounded nodule identified. No focal infiltrate or sizable effusion is seen. No definitive hilar adenopathy is seen. No bony abnormality is noted.  IMPRESSION: 2.4 cm nodule in the right middle lobe. CT of the chest is recommended for  further evaluation.  These results were called by telephone at the time of interpretation on 08/12/2014 at 5:00 pm to [facility], [place], who verbally acknowledged these results.   Electronically Signed   By: Inez Catalina M.D.   On: 08/12/2014 17:03   Ct Chest Wo Contrast  08/12/2014   CLINICAL DATA:  Neck and back pain.  Pulmonary nodule.  EXAM: CT CHEST WITHOUT CONTRAST  TECHNIQUE: Multidetector CT imaging of the chest was performed following the standard protocol without IV contrast.  COMPARISON:  None.  FINDINGS: THORACIC INLET/BODY WALL:  No acute abnormality.  MEDIASTINUM:  Normal heart size. No pericardial effusion. There is atherosclerosis of the aorta great vessels but no acute vascular findings. Proximal LAD atherosclerotic calcification. Subcarinal lymphadenopathy measuring 24 mm in diameter. Prevascular  lymphadenopathy measuring 26 mm short axis. No visible supraclavicular adenopathy.  LUNG WINDOWS:  2.2 cm pulmonary nodule in the right middle lobe with lobulated and spiculated margins. The mass has broad contact with the mildly deformed minor fissure. More inferiorly within the right middle lobe is ground-glass ill-defined nodule measuring 13 mm. There is 16 mm ground-glass nodule in the apical right upper lobe2 mm nodule in the superior segment right upper lobe, along the major fissure.  No acute findings such as pneumonia, edema, effusion, or pneumothorax.  UPPER ABDOMEN:  2.7 cm low-density nodule in the right adrenal gland consistent with adenoma. Two left adrenal nodules, also low-density, measuring up to 19 mm.  OSSEOUS:  No acute fracture.  No suspicious lytic or blastic lesions.  IMPRESSION: 1. 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 The nodule has broad contact with the minor fissure. There is also a 6 mm ground-glass nodule also in the right middle lobe. 2. 16 mm ground-glass nodule in the right upper lobe, suspicious  for low-grade adenocarcinoma given #1. 3. 4 mm nodule along the upper right major fissure. 4. Atherosclerosis, including the coronary arteries. 5. Bilateral adrenal adenomas.   Electronically Signed   By: Jorje Guild M.D.   On: 08/12/2014 18:38     EKG Interpretation   Date/Time:  Wednesday August 12 2014 15:32:35 EDT Ventricular Rate:  80 PR Interval:  146 QRS Duration: 88 QT Interval:  392 QTC Calculation: 452 R Axis:   86 Text Interpretation:  Normal sinus rhythm Normal ECG No old tracing to  compare Confirmed by [facility]  MD, Nunzio Cory [phone]) on 08/12/2014 4:21:58 PM      MDM   Final diagnoses:  Pulmonary nodule  Opacity of lung on imaging study  Adrenal adenoma, left  Adrenal adenoma, right   Patient with what appears to be musculoskeletal pain. This is likely secondary to the weight of her breasts. She has a long-standing history of back and neck pain. Patient denies any current chest pain noted she does endorse muscular chest pain. Cardiac workup ordered at triage. Plan to give the patient 1 mg Ativan for muscle relaxation as well as 1 oral Percocet and reevaluate.   7:13 PM BP 173/89  Pulse 75  Temp(Src) 98.9 F (37.2 C) (Oral)  Resp 15  SpO2 100% Patient has a 2.4 cm mass in the R middle lobe. Radiology requests CT chest. Denies fevers, chills, fatigue, unexplained weight loss. She does admit to nightly sweats. Is a pack a day smoker for many years but is unable to specify.    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  with surrounding ground glass opacity . She also has apparent BL adenomas of the adrenal glands. I have discussed the findigs with the patient who opts for admission and further work up.     I have spoken with Dr. Alcario Drought who will admit the patient for further work up.  I doubt that the patient's neck and back pain are  related to findings on the scan and feel it is musculoskeletal in nature.  I personally reviewed the imaging tests through PACS system. I have reviewed and interpreted Lab values. I reviewed available ER/hospitalization records through the EMR   Patient / Family / Caregiver informed of clinical course, understand medical decision-making process, and agree with plan.  Margarita Mail, PA-C 08/15/14 2215

## 2014-08-12 NOTE — ED Provider Notes (Signed)
Medical screening examination/treatment/procedure(s) were performed by non-physician practitioner and as supervising physician I was immediately available for consultation/collaboration.  Anes Rigel, MD 08/12/14 1640 

## 2014-08-12 NOTE — ED Provider Notes (Signed)
CSN: [id number]     Arrival date & time 08/12/14  1420 History  This chart was scribed for non-physician practitioner, Renold Genta, PA-C, working with Ernestina Patches, MD by Ladene Artist, ED Scribe. This patient was seen in room TR05C/TR05C and the patient's care was started at 3:15 PM.   The history is provided by the patient. No language interpreter was used.   3:24 PM MSE initiated  HPI Comments: Sabrina Adkins is a 53 y.o. female, with a h/o arthritis, who presents to the Emergency Department complaining of constant upper back pain, neck pain, chest pain. She reports associated dizziness. She denies SOB. Pt reports h/o pinched nerve. Pain is exertional and improved with resting. She reports h/o irregular heart rate and family h/o cardiac complications. Taking indomethacin with no relief.   Given pt is having chest pain, will need further evaluation on main side. Will get labs, ECG, chest xray. Pain medication ordered.   Although pt's chest pain is most likely caused by cervical radiculopathy, she does have several risk factors for CAD including diabetes, htn, family hx of disease, elevated cholesterol.   Will move to the main side.     Renold Genta, PA-C 08/12/14 1527

## 2014-08-12 NOTE — ED Notes (Signed)
Pt presents to department for evaluation of upper back pain radiating to neck. Pt states chronic issues with same, reports pinched nerve. 10/10 pain at the time, increases with movement. Pt is alert and oriented x4.

## 2014-08-12 NOTE — ED Notes (Signed)
Chaplain at bedside

## 2014-08-12 NOTE — ED Notes (Signed)
Report given to Ronny Bacon, RN Unit [address].

## 2014-08-12 NOTE — ED Notes (Addendum)
Patients daughter contact number [phone], she lives in [place]. Admitting physician spoke with the daughter to update her on patients status.

## 2014-08-12 NOTE — ED Notes (Signed)
Pt monitored by pulse ox, bp cuff, and 5-lead. 

## 2014-08-12 NOTE — Progress Notes (Signed)
Chaplain provided emotional support to patient who received an untimely diagnosis from [facility] here at [facility]. She is already emotionally distraught from family situations visiting here from [place]. Chaplain prayed with the patient who will undergo exploratory surgery tomorrow.  Sabrina Adkins

## 2014-08-12 NOTE — ED Notes (Signed)
Patient is upset and crying. She has no family locally but she spoke with her son in [place]. Chaplain called.

## 2014-08-13 ENCOUNTER — Encounter (HOSPITAL_COMMUNITY): Payer: Self-pay | Admitting: Adult Health

## 2014-08-13 DIAGNOSIS — E119 Type 2 diabetes mellitus without complications: Secondary | ICD-10-CM

## 2014-08-13 DIAGNOSIS — R911 Solitary pulmonary nodule: Secondary | ICD-10-CM

## 2014-08-13 DIAGNOSIS — D3500 Benign neoplasm of unspecified adrenal gland: Secondary | ICD-10-CM | POA: Insufficient documentation

## 2014-08-13 DIAGNOSIS — J449 Chronic obstructive pulmonary disease, unspecified: Secondary | ICD-10-CM

## 2014-08-13 DIAGNOSIS — Z794 Long term (current) use of insulin: Secondary | ICD-10-CM

## 2014-08-13 LAB — GLUCOSE, CAPILLARY
GLUCOSE-CAPILLARY: 224 mg/dL — AB (ref 70–99)
Glucose-Capillary: 241 mg/dL — ABNORMAL HIGH (ref 70–99)
Glucose-Capillary: 239 mg/dL — ABNORMAL HIGH (ref 70–99)

## 2014-08-13 LAB — HEMOGLOBIN A1C
Hgb A1c MFr Bld: 9.7 % — ABNORMAL HIGH (ref <5.7)
Mean Plasma Glucose: 232 mg/dL — ABNORMAL HIGH (ref <117)

## 2014-08-13 MED ORDER — INSULIN ASPART 100 UNIT/ML ~~LOC~~ SOLN
0.0000 [IU] | Freq: Three times a day (TID) | SUBCUTANEOUS | Status: DC
  Administered 2014-08-13 (×2): 3 [IU] via SUBCUTANEOUS

## 2014-08-13 MED ORDER — AMLODIPINE BESYLATE 5 MG PO TABS
5.0000 mg | ORAL_TABLET | Freq: Every day | ORAL | Status: DC
  Administered 2014-08-13: 5 mg via ORAL
  Filled 2014-08-13: qty 1

## 2014-08-13 MED ORDER — PIOGLITAZONE HCL 30 MG PO TABS: 30.0000 mg | ORAL_TABLET | Freq: Every day | ORAL | Status: AC

## 2014-08-13 MED ORDER — GABAPENTIN 100 MG PO CAPS: 100.0000 mg | ORAL_CAPSULE | Freq: Three times a day (TID) | ORAL | Status: AC

## 2014-08-13 MED ORDER — LISINOPRIL 40 MG PO TABS: 40.0000 mg | ORAL_TABLET | Freq: Every day | ORAL | Status: DC

## 2014-08-13 MED ORDER — GLIPIZIDE 5 MG PO TABS: 10.0000 mg | ORAL_TABLET | Freq: Two times a day (BID) | ORAL | Status: AC

## 2014-08-13 MED ORDER — HYDROCODONE-ACETAMINOPHEN 5-325 MG PO TABS: 1.0000 | ORAL_TABLET | Freq: Four times a day (QID) | ORAL | Status: DC | PRN

## 2014-08-13 MED ORDER — METFORMIN HCL 500 MG PO TABS: 1000.0000 mg | ORAL_TABLET | Freq: Two times a day (BID) | ORAL | Status: AC

## 2014-08-13 MED ORDER — INSULIN GLARGINE 100 UNIT/ML ~~LOC~~ SOLN: 35.0000 [IU] | Freq: Every day | SUBCUTANEOUS | Status: AC

## 2014-08-13 MED ORDER — LORAZEPAM 0.5 MG PO TABS: 0.5000 mg | ORAL_TABLET | Freq: Two times a day (BID) | ORAL | Status: AC | PRN

## 2014-08-13 MED ORDER — HYDROCODONE-ACETAMINOPHEN 5-325 MG PO TABS
1.0000 | ORAL_TABLET | Freq: Four times a day (QID) | ORAL | Status: DC | PRN
  Administered 2014-08-13 (×3): 2 via ORAL
  Filled 2014-08-13 (×3): qty 2

## 2014-08-13 MED ORDER — AMLODIPINE BESYLATE 5 MG PO TABS: 5.0000 mg | ORAL_TABLET | Freq: Every day | ORAL | Status: DC

## 2014-08-13 MED ORDER — HYDROCHLOROTHIAZIDE 25 MG PO TABS: 25.0000 mg | ORAL_TABLET | Freq: Every day | ORAL | Status: DC

## 2014-08-13 MED ORDER — QUETIAPINE FUMARATE 50 MG PO TABS: 50.0000 mg | ORAL_TABLET | Freq: Every day | ORAL | Status: AC

## 2014-08-13 NOTE — Discharge Summary (Addendum)
Physician Discharge Summary  Sabrina Adkins LGX:[id number] DOB: [birth year] DOA: 08/12/2014  PCP: Default, Provider, MD  Admit date: 08/12/2014 Discharge date: 08/13/2014  Time spent:>35 minutes  Recommendations for Outpatient Follow-up:  F/u with dr. Lamonte Sakai on 08/14/14 at 12..00 F/u with PCP in 1 week  Discharge Diagnoses:  Principal Problem:   Pulmonary mass Active Problems:   Pulmonary nodule   Discharge Condition:  Stable   Diet recommendation:  DM Filed Weights   08/12/14 2200  Weight: 116.3 kg (256 lb 6.3 oz)    History of present illness:  53 y/o female with PMH of IDDM, HTN, DJD, COPD, smoker presented with generalized musculoskeletal complaints including back, neck and chest pain, cough found to have lung mass    Hospital Course: 1. Lung mass, suspected lung CA; CT: right middle lobe bronchogenic carcinoma with ipsilateral and subcarinal lymphadenopathy, 6 mm ground-glass nodule also in the right middle lobe. 16 mm ground-glass odule in the right upper lobe, suspicious for low-grade adenocarcinoma  -consulted pulmonology evaluation for bronch/biopsy; Pulmonology recommended outpatient navigational bronch on 08/14/14 with Dr. Lamonte Sakai;  -defer further management of lung mass, metastatic work up to pulmonology' appreciate the assistance   2. IDDM; uncontrolled; no recent HA1c  -resume insulin, regimen;  check a1c pend; Patient reports non compliance; d/w patient recommended to f/u with PCP in 1 week to f/u on ha1c; and adjustment in management as needed  3. HTN, uncontrolled on admission  -resume home meds; added norvasc; titarte outpatient as needed  4. COPD; no wheezing on exam;  stop smoking  5. Peripheral neuropathy, neck pain; cont gabapentin; no exertional symptoms; no acute chest pain;  outpatient follow up     Procedures:  none (i.e. Studies not automatically included, echos, thoracentesis, etc; not x-rays)  Consultations:  Pulmonology   Discharge Exam: Filed  Vitals:   08/13/14 1712  BP: 184/89  Pulse:   Temp:   Resp:     General: alert Cardiovascular: s1,s2 rrr Respiratory: CTA BL  Discharge Instructions  Discharge Instructions   Diet - low sodium heart healthy    Complete by:  As directed      Discharge instructions    Complete by:  As directed   F/u with Dr. Lamonte Sakai on 08/14/14 at 11.45 at  [address]    [place] [address]     Increase activity slowly    Complete by:  As directed             Medication List         amLODipine 5 MG tablet  Commonly known as:  NORVASC  Take 1 tablet (5 mg total) by mouth daily.     aspirin EC 81 MG tablet  Take 81 mg by mouth daily.     BC HEADACHE POWDER PO  Take 1 Package by mouth daily as needed (for pain).     gabapentin 100 MG capsule  Commonly known as:  NEURONTIN  Take 1 capsule (100 mg total) by mouth 3 (three) times daily.     glipiZIDE 5 MG tablet  Commonly known as:  GLUCOTROL  Take 2 tablets (10 mg total) by mouth 2 (two) times daily before a meal.     hydrochlorothiazide 25 MG tablet  Commonly known as:  HYDRODIURIL  Take 1 tablet (25 mg total) by mouth daily.     HYDROcodone-acetaminophen 5-325 MG per tablet  Commonly known as:  NORCO/VICODIN  Take 1-2 tablets by mouth every 6 (six) hours as needed  for moderate pain.     indomethacin 50 MG capsule  Commonly known as:  INDOCIN  Take 50 mg by mouth 2 (two) times daily with a meal.     insulin glargine 100 UNIT/ML injection  Commonly known as:  LANTUS  Inject 0.35 mLs (35 Units total) into the skin at bedtime.     lisinopril 40 MG tablet  Commonly known as:  PRINIVIL,ZESTRIL  Take 1 tablet (40 mg total) by mouth daily.     LORazepam 0.5 MG tablet  Commonly known as:  ATIVAN  Take 1 tablet (0.5 mg total) by mouth 2 (two) times daily as needed. For nerves     metFORMIN 500 MG tablet  Commonly known as:  GLUCOPHAGE  Take 2 tablets (1,000 mg total) by mouth 2 (two) times daily with a meal.      multivitamin with minerals Tabs tablet  Take 1 tablet by mouth daily.     pioglitazone 30 MG tablet  Commonly known as:  ACTOS  Take 1 tablet (30 mg total) by mouth daily.     QUEtiapine 50 MG tablet  Commonly known as:  SEROQUEL  Take 1 tablet (50 mg total) by mouth at bedtime.       Allergies  Allergen Reactions  . Aspirin Hives, Shortness Of Breath, Itching and Rash    Okay to tolerate coated aspirin-MUST BE E.C.  . Ibuprofen Hives, Shortness Of Breath, Itching and Rash  . Penicillins Anaphylaxis and Hives    THROAT CLOSES  . Iodine Hives, Itching and Rash  . Latex Hives, Itching and Rash       Follow-up Information   Follow up with Collene Gobble., MD On 08/14/2014. (12:00 noon)    Specialty:  Pulmonary Disease   Contact information:   [address] [address] [phone]       Follow up with [facility]    . Schedule an appointment as soon as possible for a visit in 1 week.   Contact information:   [place] [address] [phone]       The results of significant diagnostics from this hospitalization (including imaging, microbiology, ancillary and laboratory) are listed below for reference.    Significant Diagnostic Studies: Dg Chest 2 View  08/12/2014   CLINICAL DATA:  Chest pain  EXAM: CHEST  2 VIEW  COMPARISON:  None.  FINDINGS: Cardiac shadow is within normal limits. The lungs are clear bilaterally. In the right mid lung projecting in the right middle lobe there is a 2.4 cm rounded nodule identified. No focal infiltrate or sizable effusion is seen. No definitive hilar adenopathy is seen. No bony abnormality is noted.  IMPRESSION: 2.4 cm nodule in the right middle lobe. CT of the chest is recommended for further evaluation.  These results were called by telephone at the time of interpretation on 08/12/2014 at 5:00 pm to [facility], [place], who verbally acknowledged these results.   Electronically  Signed   By: Inez Catalina M.D.   On: 08/12/2014 17:03   Ct Chest Wo Contrast  08/12/2014   CLINICAL DATA:  Neck and back pain.  Pulmonary nodule.  EXAM: CT CHEST WITHOUT CONTRAST  TECHNIQUE: Multidetector CT imaging of the chest was performed following the standard protocol without IV contrast.  COMPARISON:  None.  FINDINGS: THORACIC INLET/BODY WALL:  No acute abnormality.  MEDIASTINUM:  Normal heart size. No pericardial effusion. There is atherosclerosis of the aorta great vessels  but no acute vascular findings. Proximal LAD atherosclerotic calcification. Subcarinal lymphadenopathy measuring 24 mm in diameter. Prevascular lymphadenopathy measuring 26 mm short axis. No visible supraclavicular adenopathy.  LUNG WINDOWS:  2.2 cm pulmonary nodule in the right middle lobe with lobulated and spiculated margins. The mass has broad contact with the mildly deformed minor fissure. More inferiorly within the right middle lobe is ground-glass ill-defined nodule measuring 13 mm. There is 16 mm ground-glass nodule in the apical right upper lobe2 mm nodule in the superior segment right upper lobe, along the major fissure.  No acute findings such as pneumonia, edema, effusion, or pneumothorax.  UPPER ABDOMEN:  2.7 cm low-density nodule in the right adrenal gland consistent with adenoma. Two left adrenal nodules, also low-density, measuring up to 19 mm.  OSSEOUS:  No acute fracture.  No suspicious lytic or blastic lesions.  IMPRESSION: 1. 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 The nodule has broad contact with the minor fissure. There is also a 6 mm ground-glass nodule also in the right middle lobe. 2. 16 mm ground-glass nodule in the right upper lobe, suspicious for low-grade adenocarcinoma given #1. 3. 4 mm nodule along the upper right major fissure. 4. Atherosclerosis, including the coronary arteries. 5. Bilateral adrenal adenomas.   Electronically  Signed   By: Jorje Guild M.D.   On: 08/12/2014 18:38    Microbiology: No results found for this or any previous visit (from the past 240 hour(s)).   Labs: Basic Metabolic Panel:  Recent Labs Lab 08/12/14 1524  NA 134*  K 3.6*  CL 95*  CO2 26  GLUCOSE 334*  BUN 14  CREATININE 0.78  CALCIUM 9.5   Liver Function Tests: No results found for this basename: AST, ALT, ALKPHOS, BILITOT, PROT, ALBUMIN,  in the last 168 hours No results found for this basename: LIPASE, AMYLASE,  in the last 168 hours No results found for this basename: AMMONIA,  in the last 168 hours CBC:  Recent Labs Lab 08/12/14 1524  WBC 12.9*  NEUTROABS 8.0*  HGB 13.3  HCT 39.8  MCV 72.5*  PLT 219   Cardiac Enzymes: No results found for this basename: CKTOTAL, CKMB, CKMBINDEX, TROPONINI,  in the last 168 hours BNP: BNP (last 3 results) No results found for this basename: PROBNP,  in the last 8760 hours CBG:  Recent Labs Lab 08/13/14 0819 08/13/14 1133  GLUCAP 241* 239*       Signed:  Rowe Clack N  [facility] 08/13/2014, 5:39 PM

## 2014-08-13 NOTE — Consult Note (Addendum)
Name: Sabrina Adkins MRN: [id number] DOB: [birth year]    ADMISSION DATE:  08/12/2014 CONSULTATION DATE:  08/13/14  REFERRING MD :  Daleen Bo  PRIMARY SERVICE:  [facility]   CHIEF COMPLAINT:  Lung mass   BRIEF PATIENT DESCRIPTION: 53yo female active smoker with hx DM, HTN, COPD initially presented 9/2 with cough as well as back, neck and chest pain.  Workup revealed RML 2.2cm nodule as well as mult smaller nodules.  PCCM consulted.   SIGNIFICANT EVENTS / STUDIES:  CT chest 9/2>>> 2.2cm RML nodule, additional 57mm RML nodule, 29mm RUL nodule  LINES / TUBES:   CULTURES:   ANTIBIOTICS:   HISTORY OF PRESENT ILLNESS:  53yo female active smoker with hx DM, HTN, COPD initially presented 9/2 with cough as well as back, neck and chest pain.  Workup revealed RML 2.2cm nodule as well as mult smaller nodules.  PCCM consulted.   Denies cough, hemoptysis, weight loss, night sweats, fevers/chills, SOB.  15 pack year smoking hx.  Worked as [employer]arry manager.   PAST MEDICAL HISTORY :  Past Medical History  Diagnosis Date  . Hypertension   . Arthritis   . Diabetes mellitus without complication    History reviewed. No pertinent past surgical history. Prior to Admission medications   Medication Sig Start Date End Date Taking? Authorizing Provider  aspirin EC 81 MG tablet Take 81 mg by mouth daily.   Yes Historical Provider, MD  Aspirin-Salicylamide-Caffeine (BC HEADACHE POWDER PO) Take 1 Package by mouth daily as needed (for pain).   Yes Historical Provider, MD  gabapentin (NEURONTIN) 100 MG capsule Take 100 mg by mouth 3 (three) times daily.   Yes Historical Provider, MD  glipiZIDE (GLUCOTROL) 5 MG tablet Take 10 mg by mouth 2 (two) times daily before a meal.   Yes Historical Provider, MD  hydrochlorothiazide (HYDRODIURIL) 25 MG tablet Take 25 mg by mouth daily.   Yes Historical Provider, MD  indomethacin (INDOCIN) 50 MG capsule Take 50 mg by mouth 2 (two) times daily with a meal.   Yes Historical Provider, MD    insulin glargine (LANTUS) 100 UNIT/ML injection Inject 35 Units into the skin at bedtime.    Yes Historical Provider, MD  lisinopril (PRINIVIL,ZESTRIL) 40 MG tablet Take 40 mg by mouth daily.   Yes Historical Provider, MD  LORazepam (ATIVAN) 0.5 MG tablet Take 0.5 mg by mouth 2 (two) times daily as needed. For nerves   Yes Historical Provider, MD  metFORMIN (GLUCOPHAGE) 500 MG tablet Take 1,000 mg by mouth 2 (two) times daily with a meal.   Yes Historical Provider, MD  Multiple Vitamin (MULTIVITAMIN WITH MINERALS) TABS tablet Take 1 tablet by mouth daily.   Yes Historical Provider, MD  pioglitazone (ACTOS) 30 MG tablet Take 30 mg by mouth daily.   Yes Historical Provider, MD  QUEtiapine (SEROQUEL) 50 MG tablet Take 50 mg by mouth at bedtime.   Yes Historical Provider, MD   Allergies  Allergen Reactions  . Aspirin Hives, Shortness Of Breath, Itching and Rash    Okay to tolerate coated aspirin-MUST BE E.C.  . Ibuprofen Hives, Shortness Of Breath, Itching and Rash  . Penicillins Anaphylaxis and Hives    THROAT CLOSES  . Iodine Hives, Itching and Rash  . Latex Hives, Itching and Rash    FAMILY HISTORY:  No family history on file. SOCIAL HISTORY:  reports that she has been smoking.  She does not have any smokeless tobacco history on file. She reports that she does not drink  alcohol or use illicit drugs.  REVIEW OF SYSTEMS:   As per HPI - All other systems reviewed and were neg.   SUBJECTIVE:   VITAL SIGNS: Temp:  [98 F (36.7 C)-98.2 F (36.8 C)] 98.2 F (36.8 C) (09/03 1349) Pulse Rate:  [65-84] 73 (09/03 1349) Resp:  [10-28] 18 (09/03 1349) BP: (150-206)/(68-101) 184/89 mmHg (09/03 1349) SpO2:  [95 %-100 %] 100 % (09/03 1349) Weight:  [256 lb 6.3 oz (116.3 kg)] 256 lb 6.3 oz (116.3 kg) (09/02 2200)  PHYSICAL EXAMINATION: General:  Pleasant female, NAD  Neuro:  Awake, alert, appropriate, MAE HEENT:  Mm moist, no JVD, no LA Cardiovascular:  s1s2 distant Lungs:  resps even  non labored, diminished, otherwise clear  Abdomen:  Obese, soft, +bs Musculoskeletal:  Warm and dry, no sig edema     Recent Labs Lab 08/12/14 1524  NA 134*  K 3.6*  CL 95*  CO2 26  BUN 14  CREATININE 0.78  GLUCOSE 334*    Recent Labs Lab 08/12/14 1524  HGB 13.3  HCT 39.8  WBC 12.9*  PLT 219   Dg Chest 2 View  08/12/2014   CLINICAL DATA:  Chest pain  EXAM: CHEST  2 VIEW  COMPARISON:  None.  FINDINGS: Cardiac shadow is within normal limits. The lungs are clear bilaterally. In the right mid lung projecting in the right middle lobe there is a 2.4 cm rounded nodule identified. No focal infiltrate or sizable effusion is seen. No definitive hilar adenopathy is seen. No bony abnormality is noted.  IMPRESSION: 2.4 cm nodule in the right middle lobe. CT of the chest is recommended for further evaluation.  These results were called by telephone at the time of interpretation on 08/12/2014 at 5:00 pm to [facility], [place], who verbally acknowledged these results.   Electronically Signed   By: Inez Catalina M.D.   On: 08/12/2014 17:03   Ct Chest Wo Contrast  08/12/2014   CLINICAL DATA:  Neck and back pain.  Pulmonary nodule.  EXAM: CT CHEST WITHOUT CONTRAST  TECHNIQUE: Multidetector CT imaging of the chest was performed following the standard protocol without IV contrast.  COMPARISON:  None.  FINDINGS: THORACIC INLET/BODY WALL:  No acute abnormality.  MEDIASTINUM:  Normal heart size. No pericardial effusion. There is atherosclerosis of the aorta great vessels but no acute vascular findings. Proximal LAD atherosclerotic calcification. Subcarinal lymphadenopathy measuring 24 mm in diameter. Prevascular lymphadenopathy measuring 26 mm short axis. No visible supraclavicular adenopathy.  LUNG WINDOWS:  2.2 cm pulmonary nodule in the right middle lobe with lobulated and spiculated margins. The mass has broad contact with the mildly deformed minor fissure. More inferiorly within the right middle lobe is  ground-glass ill-defined nodule measuring 13 mm. There is 16 mm ground-glass nodule in the apical right upper lobe2 mm nodule in the superior segment right upper lobe, along the major fissure.  No acute findings such as pneumonia, edema, effusion, or pneumothorax.  UPPER ABDOMEN:  2.7 cm low-density nodule in the right adrenal gland consistent with adenoma. Two left adrenal nodules, also low-density, measuring up to 19 mm.  OSSEOUS:  No acute fracture.  No suspicious lytic or blastic lesions.  IMPRESSION: 1. 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 The nodule has broad contact with the minor fissure. There is also a 6 mm ground-glass nodule also in the right middle lobe. 2. 16 mm ground-glass nodule in the right upper lobe, suspicious for low-grade adenocarcinoma given #  1. 3. 4 mm nodule along the upper right major fissure. 4. Atherosclerosis, including the coronary arteries. 5. Bilateral adrenal adenomas.   Electronically Signed   By: Jorje Guild M.D.   On: 08/12/2014 18:38    ASSESSMENT / PLAN:  RML pulmonary nodule - concerning for primary lung ca.  Also mult smaller nodules.  Hx COPD   PLAN -  Suspect needs ENB v needle bx - although likely not quite peripheral enough for needle bx  Have d/w Dr Lamonte Sakai, interventional plan is outpt Navigational bronch Can have this as outpt, our office will arrange this  [place] would be Bx path--> favorable then resection  Will need pet likely Cont w/u for further mets ie CT head, etc per primary  Smoking cessation  PRN BD Obtain coags  Nickolas Madrid, NP 08/13/2014  3:36 PM Pager: [phone] or [phone]  *Care during the described time interval was provided by me and/or other providers on the critical care team. I have reviewed this patient's available data, including medical history, events of note, physical examination and test results as part of my  evaluation.   Lavon Paganini. Titus Mould, MD, [place] Pgr: [facility]

## 2014-08-13 NOTE — Progress Notes (Signed)
Admitted with pulmonary mass. Patient has history of DM.  Current orders are Lantus 35 units every HS, Metformin, Actos, and Glucotrol.  Recommend adding Novolog MODERATE correction scale TID & HS while in the hospital.  Will continue to follow.  Harvel Ricks RN BSN CDE

## 2014-08-13 NOTE — Discharge Instructions (Signed)
Follow up with Dr. Lamonte Sakai on 08/14/14 at 11.45 at  [address] [place] [address]

## 2014-08-13 NOTE — Progress Notes (Signed)
[facility] PROGRESS NOTE  Sabrina Adkins OJJ:[id number] DOB: 1961-01-29 DOA: 08/12/2014 PCP: Default, Provider, MD  Assessment/Plan: 53 y/o female with PMH of IDDM, HTN, DJD, COPD, smoker presented with generalized musculoskeletal complaints including back, neck and chest pain, cough found to have lung mass  1. Lung mass, suspected lung CA; CT: right middle lobe bronchogenic carcinoma with ipsilateral and subcarinal lymphadenopathy, 6 mm ground-glass nodule also in the right middle lobe. 16 mm ground-glass odule in the right upper lobe, suspicious for low-grade adenocarcinoma  -consulted pulmonology evaluation for bronch/biopsy; need work up for mets   2. IDDM; uncontrolled; no recent HA1c -resume insulin+ISS; check a1c 3. HTN, uncontrolled on admission  -resume home meds; titrate as needed  4. COPD; no wheezing on exam; cont bronchodilators, stop smoking    Code Status: full Family Communication: d/w patient (indicate person spoken with, relationship, and if by phone, the number) Disposition Plan: home pend clinical improvement    Consultants:  pccm  Procedures:  none  Antibiotics:  none (indicate start date, and stop date if known)  HPI/Subjective: alert  Objective: Filed Vitals:   08/13/14 0529  BP: 150/68  Pulse: 70  Temp: 98 F (36.7 C)  Resp: 17    Intake/Output Summary (Last 24 hours) at 08/13/14 1116 Last data filed at 08/13/14 0900  Gross per 24 hour  Intake    720 ml  Output      0 ml  Net    720 ml   Filed Weights   08/12/14 2200  Weight: 116.3 kg (256 lb 6.3 oz)    Exam:   General:  alert  Cardiovascular: s1,s2 rrr  Respiratory: CTA BL  Abdomen: soft, nt,nd   Musculoskeletal: no LE edema   Data Reviewed: Basic Metabolic Panel:  Recent Labs Lab 08/12/14 1524  NA 134*  K 3.6*  CL 95*  CO2 26  GLUCOSE 334*  BUN 14  CREATININE 0.78  CALCIUM 9.5   Liver Function Tests: No results found for this basename: AST, ALT,  ALKPHOS, BILITOT, PROT, ALBUMIN,  in the last 168 hours No results found for this basename: LIPASE, AMYLASE,  in the last 168 hours No results found for this basename: AMMONIA,  in the last 168 hours CBC:  Recent Labs Lab 08/12/14 1524  WBC 12.9*  NEUTROABS 8.0*  HGB 13.3  HCT 39.8  MCV 72.5*  PLT 219   Cardiac Enzymes: No results found for this basename: CKTOTAL, CKMB, CKMBINDEX, TROPONINI,  in the last 168 hours BNP (last 3 results) No results found for this basename: PROBNP,  in the last 8760 hours CBG:  Recent Labs Lab 08/13/14 0819  GLUCAP 241*    No results found for this or any previous visit (from the past 240 hour(s)).   Studies: Dg Chest 2 View  08/12/2014   CLINICAL DATA:  Chest pain  EXAM: CHEST  2 VIEW  COMPARISON:  None.  FINDINGS: Cardiac shadow is within normal limits. The lungs are clear bilaterally. In the right mid lung projecting in the right middle lobe there is a 2.4 cm rounded nodule identified. No focal infiltrate or sizable effusion is seen. No definitive hilar adenopathy is seen. No bony abnormality is noted.  IMPRESSION: 2.4 cm nodule in the right middle lobe. CT of the chest is recommended for further evaluation.  These results were called by telephone at the time of interpretation on 08/12/2014 at 5:00 pm to [facility], [place], who verbally acknowledged these results.   Electronically Signed  By: Inez Catalina M.D.   On: 08/12/2014 17:03   Ct Chest Wo Contrast  08/12/2014   CLINICAL DATA:  Neck and back pain.  Pulmonary nodule.  EXAM: CT CHEST WITHOUT CONTRAST  TECHNIQUE: Multidetector CT imaging of the chest was performed following the standard protocol without IV contrast.  COMPARISON:  None.  FINDINGS: THORACIC INLET/BODY WALL:  No acute abnormality.  MEDIASTINUM:  Normal heart size. No pericardial effusion. There is atherosclerosis of the aorta great vessels but no acute vascular findings. Proximal LAD atherosclerotic calcification. Subcarinal  lymphadenopathy measuring 24 mm in diameter. Prevascular lymphadenopathy measuring 26 mm short axis. No visible supraclavicular adenopathy.  LUNG WINDOWS:  2.2 cm pulmonary nodule in the right middle lobe with lobulated and spiculated margins. The mass has broad contact with the mildly deformed minor fissure. More inferiorly within the right middle lobe is ground-glass ill-defined nodule measuring 13 mm. There is 16 mm ground-glass nodule in the apical right upper lobe2 mm nodule in the superior segment right upper lobe, along the major fissure.  No acute findings such as pneumonia, edema, effusion, or pneumothorax.  UPPER ABDOMEN:  2.7 cm low-density nodule in the right adrenal gland consistent with adenoma. Two left adrenal nodules, also low-density, measuring up to 19 mm.  OSSEOUS:  No acute fracture.  No suspicious lytic or blastic lesions.  IMPRESSION: 1. 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 The nodule has broad contact with the minor fissure. There is also a 6 mm ground-glass nodule also in the right middle lobe. 2. 16 mm ground-glass nodule in the right upper lobe, suspicious for low-grade adenocarcinoma given #1. 3. 4 mm nodule along the upper right major fissure. 4. Atherosclerosis, including the coronary arteries. 5. Bilateral adrenal adenomas.   Electronically Signed   By: Jorje Guild M.D.   On: 08/12/2014 18:38    Scheduled Meds: . aspirin EC  81 mg Oral Daily  . gabapentin  100 mg Oral TID  . glipiZIDE  10 mg Oral BID AC  . heparin  5,000 Units Subcutaneous 3 times per day  . hydrochlorothiazide  25 mg Oral Daily  . indomethacin  50 mg Oral BID WC  . insulin glargine  35 Units Subcutaneous QHS  . lisinopril  40 mg Oral Daily  . metFORMIN  1,000 mg Oral BID WC  . multivitamin with minerals  1 tablet Oral Daily  . pioglitazone  30 mg Oral Daily  . QUEtiapine  50 mg Oral QHS   Continuous Infusions:    Principal Problem:   Pulmonary mass Active Problems:   Pulmonary nodule    Time spent: >35 minutes     Kinnie Feil  [facility] Pager [phone]. If 7PM-7AM, please contact night-coverage at [email or web address], password [facility] 08/13/2014, 11:16 AM  LOS: 1 day

## 2014-08-13 NOTE — Progress Notes (Signed)
Pt. Discharge to home.  Discharge instructions given to patient. No question verbalized.  Provided bus ticket.

## 2014-08-14 ENCOUNTER — Encounter: Payer: Self-pay | Admitting: Emergency Medicine

## 2014-08-14 ENCOUNTER — Ambulatory Visit (INDEPENDENT_AMBULATORY_CARE_PROVIDER_SITE_OTHER): Payer: Medicaid Other | Admitting: Emergency Medicine

## 2014-08-14 VITALS — BP 134/76 | HR 90 | Ht 63.5 in | Wt 258.0 lb

## 2014-08-14 DIAGNOSIS — R911 Solitary pulmonary nodule: Secondary | ICD-10-CM

## 2014-08-14 NOTE — Assessment & Plan Note (Signed)
Lobulate RML nodule in a smoker, also 2 other distinct GG nodules and a large station 7 node. Agree that we should bx. Will screen her for anesthesia with spirometry. Plan for ENB + EBUS.

## 2014-08-14 NOTE — Progress Notes (Signed)
Subjective:    Patient ID: Sabrina Adkins, female    DOB: 1961/02/07, 53 y.o.   MRN: [id number]  HPI 53 yo smoker (15 pk-yrs), hx HTN, DM. She was seen in the ED on 08/12/14 for neck pain. A CXR showed a RML nodule. CT scan confirmed this and 2 other GG nodules. She presents today to discuss bx.  She denies cough or dyspnea.    Review of Systems  Constitutional: Negative for fever and unexpected weight change.  HENT: Negative for congestion, dental problem, ear pain, nosebleeds, postnasal drip, rhinorrhea, sinus pressure, sneezing, sore throat and trouble swallowing.   Eyes: Negative for redness and itching.  Respiratory: Negative for cough, chest tightness, shortness of breath and wheezing.   Cardiovascular: Negative for palpitations and leg swelling.  Gastrointestinal: Negative for nausea and vomiting.  Genitourinary: Negative for dysuria.  Musculoskeletal: Positive for back pain. Negative for joint swelling.  Skin: Negative for rash.  Neurological: Positive for headaches.  Hematological: Does not bruise/bleed easily.  Psychiatric/Behavioral: Negative for dysphoric mood. The patient is not nervous/anxious.     Past Medical History  Diagnosis Date  . Hypertension   . Arthritis   . Diabetes mellitus without complication      Family History  Problem Relation Age of Onset  . Heart disease Father   . Clotting disorder Mother   . Rheum arthritis Mother      History   Social History  . Marital Status: Single    Spouse Name: N/A    Number of Children: N/A  . Years of Education: N/A   Occupational History  . Not on file.   Social History Main Topics  . Smoking status: Current Every Day Smoker -- 0.50 packs/day for 30 years    Types: Cigarettes  . Smokeless tobacco: Never Used  . Alcohol Use: No  . Drug Use: No  . Sexual Activity: Not on file   Other Topics Concern  . Not on file   Social History Narrative  . No narrative on file  Has worked in a SNF,  janitorial No known TB exposure > PPD has been negative 2011 or 12 There is a mold exposure at home.   Allergies  Allergen Reactions  . Aspirin Hives, Shortness Of Breath, Itching and Rash    Okay to tolerate coated aspirin-MUST BE E.C.  . Ibuprofen Hives, Shortness Of Breath, Itching and Rash  . Penicillins Anaphylaxis and Hives    THROAT CLOSES  . Orange Juice [Orange Oil]     hives  . Iodine Hives, Itching and Rash  . Latex Hives, Itching and Rash     Outpatient Prescriptions Prior to Visit  Medication Sig Dispense Refill  . amLODipine (NORVASC) 5 MG tablet Take 1 tablet (5 mg total) by mouth daily.  30 tablet  1  . aspirin EC 81 MG tablet Take 81 mg by mouth daily.      . Aspirin-Salicylamide-Caffeine (BC HEADACHE POWDER PO) Take 1 Package by mouth daily as needed (for pain).      Marland Kitchen gabapentin (NEURONTIN) 100 MG capsule Take 1 capsule (100 mg total) by mouth 3 (three) times daily.  90 capsule  0  . glipiZIDE (GLUCOTROL) 5 MG tablet Take 2 tablets (10 mg total) by mouth 2 (two) times daily before a meal.  60 tablet  1  . hydrochlorothiazide (HYDRODIURIL) 25 MG tablet Take 1 tablet (25 mg total) by mouth daily.  30 tablet  1  . HYDROcodone-acetaminophen (NORCO/VICODIN) 5-325 MG per  tablet Take 1-2 tablets by mouth every 6 (six) hours as needed for moderate pain.  30 tablet  0  . indomethacin (INDOCIN) 50 MG capsule Take 50 mg by mouth 2 (two) times daily with a meal.      . insulin glargine (LANTUS) 100 UNIT/ML injection Inject 0.35 mLs (35 Units total) into the skin at bedtime.  10 mL  11  . lisinopril (PRINIVIL,ZESTRIL) 40 MG tablet Take 1 tablet (40 mg total) by mouth daily.  30 tablet  1  . LORazepam (ATIVAN) 0.5 MG tablet Take 1 tablet (0.5 mg total) by mouth 2 (two) times daily as needed. For nerves  30 tablet  0  . metFORMIN (GLUCOPHAGE) 500 MG tablet Take 2 tablets (1,000 mg total) by mouth 2 (two) times daily with a meal.  60 tablet  0  . Multiple Vitamin (MULTIVITAMIN WITH  MINERALS) TABS tablet Take 1 tablet by mouth daily.      . pioglitazone (ACTOS) 30 MG tablet Take 1 tablet (30 mg total) by mouth daily.  30 tablet  0  . QUEtiapine (SEROQUEL) 50 MG tablet Take 1 tablet (50 mg total) by mouth at bedtime.  30 tablet  0   No facility-administered medications prior to visit.       Objective:   Physical Exam  Filed Vitals:   08/14/14 1140  BP: 134/76  Pulse: 90  Height: 5' 3.5" (1.613 m)  Weight: 258 lb (117.028 kg)  SpO2: 98%   Gen: Pleasant, obese, in no distress,  normal affect  ENT: No lesions,  mouth clear,  oropharynx clear, no postnasal drip  Neck: No JVD, no TMG, no carotid bruits  Lungs: No use of accessory muscles, clear without rales or rhonchi  Cardiovascular: RRR, heart sounds normal, no murmur or gallops, no peripheral edema  Musculoskeletal: No deformities, no cyanosis or clubbing  Neuro: alert, non focal  Skin: Warm, no lesions or rashes   CT 08/12/14 --  COMPARISON: None.  FINDINGS:  THORACIC INLET/BODY WALL:  No acute abnormality.  MEDIASTINUM:  Normal heart size. No pericardial effusion. There is atherosclerosis  of the aorta great vessels but no acute vascular findings. Proximal  LAD atherosclerotic calcification. Subcarinal lymphadenopathy  measuring 24 mm in diameter. Prevascular lymphadenopathy measuring  26 mm short axis. No visible supraclavicular adenopathy.  LUNG WINDOWS:  2.2 cm pulmonary nodule in the right middle lobe with lobulated and  spiculated margins. The mass has broad contact with the mildly  deformed minor fissure. More inferiorly within the right middle lobe  is ground-glass ill-defined nodule measuring 13 mm. There is 16 mm  ground-glass nodule in the apical right upper lobe2 mm nodule in the  superior segment right upper lobe, along the major fissure.  No acute findings such as pneumonia, edema, effusion, or  pneumothorax.  UPPER ABDOMEN:  2.7 cm low-density nodule in the right adrenal gland  consistent with  adenoma. Two left adrenal nodules, also low-density, measuring up to  19 mm.  OSSEOUS:  No acute fracture. No suspicious lytic or blastic lesions.  IMPRESSION:  1. 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 The nodule has  broad contact with the minor fissure. There is also a 6 mm  ground-glass nodule also in the right middle lobe.  2. 16 mm ground-glass nodule in the right upper lobe, suspicious for  low-grade adenocarcinoma given #1.  3. 4 mm nodule along the upper right major fissure.  4. Atherosclerosis, including  the coronary arteries.  5. Bilateral adrenal adenomas.        Assessment & Plan:  Pulmonary nodule Lobulate RML nodule in a smoker, also 2 other distinct GG nodules and a large station 7 node. Agree that we should bx. Will screen her for anesthesia with spirometry. Plan for ENB + EBUS.

## 2014-08-14 NOTE — Patient Instructions (Signed)
We will arrange for a bronchoscopy with ultrasound and electromagnetic navigation to perform biopsies on your lung We will perform spirometry today to check lung function Work on stopping or decreasing your cigarettes Follow with Dr Lamonte Sakai in 1 month

## 2014-08-16 NOTE — ED Provider Notes (Signed)
Medical screening examination/treatment/procedure(s) were performed by non-physician practitioner and as supervising physician I was immediately available for consultation/collaboration.   EKG Interpretation   Date/Time:  Wednesday August 12 2014 15:32:35 EDT Ventricular Rate:  80 PR Interval:  146 QRS Duration: 88 QT Interval:  392 QTC Calculation: 452 R Axis:   86 Text Interpretation:  Normal sinus rhythm Normal ECG No old tracing to  compare Confirmed by [facility]  MD, Nunzio Cory [phone]) on 08/12/2014 4:21:58 PM        Francine Graven, DO 08/16/14 1625

## 2014-08-24 NOTE — Pre-Procedure Instructions (Signed)
Jasmyn Picha  08/24/2014   Your procedure is scheduled on:  Wednesday, September 16th  Report to [facility] Admitting at 700 AM.  Call this number if you have problems the morning of surgery: [phone]   Remember:   Do not eat food or drink liquids after midnight.   Take these medicines the morning of surgery with A SIP OF WATER: norvasc, neurontin, ativan if needed, hydrocodone if needed   Do not wear jewelry, make-up or nail polish.  Do not wear lotions, powders, or perfumes. You may wear deodorant.  Do not shave 48 hours prior to surgery. Men may shave face and neck.  Do not bring valuables to the hospital.  [facility] is not responsible for any belongings or valuables.               Contacts, dentures or bridgework may not be worn into surgery.  Leave suitcase in the car. After surgery it may be brought to your room.  For patients admitted to the hospital, discharge time is determined by your treatment team.               Patients discharged the day of surgery will not be allowed to drive home.  Please read over the following fact sheets that you were given: Pain Booklet, Coughing and Deep Breathing and Surgical Site Infection Prevention [place] - Preparing for Surgery  Before surgery, you can play an important role.  Because skin is not sterile, your skin needs to be as free of germs as possible.  You can reduce the number of germs on you skin by washing with CHG (chlorahexidine gluconate) soap before surgery.  CHG is an antiseptic cleaner which kills germs and bonds with the skin to continue killing germs even after washing.  Please DO NOT use if you have an allergy to CHG or antibacterial soaps.  If your skin becomes reddened/irritated stop using the CHG and inform your nurse when you arrive at Short Stay.  Do not shave (including legs and underarms) for at least 48 hours prior to the first CHG shower.  You may shave your face.  Please follow these  instructions carefully:   1.  Shower with CHG Soap the night before surgery and the morning of Surgery.  2.  If you choose to wash your hair, wash your hair first as usual with your normal shampoo.  3.  After you shampoo, rinse your hair and body thoroughly to remove the shampoo.  4.  Use CHG as you would any other liquid soap.  You can apply CHG directly to the skin and wash gently with scrungie or a clean washcloth.  5.  Apply the CHG Soap to your body ONLY FROM THE NECK DOWN.  Do not use on open wounds or open sores.  Avoid contact with your eyes, ears, mouth and genitals (private parts).  Wash genitals (private parts) with your normal soap.  6.  Wash thoroughly, paying special attention to the area where your surgery will be performed.  7.  Thoroughly rinse your body with warm water from the neck down.  8.  DO NOT shower/wash with your normal soap after using and rinsing off the CHG Soap.  9.  Pat yourself dry with a clean towel.            10.  Wear clean pajamas.            11.  Place clean sheets on your bed the night  of your first shower and do not sleep with pets.  Day of Surgery  Do not apply any lotions/deoderants the morning of surgery.  Please wear clean clothes to the hospital/surgery center.

## 2014-08-25 ENCOUNTER — Telehealth: Payer: Self-pay | Admitting: Emergency Medicine

## 2014-08-25 ENCOUNTER — Encounter (HOSPITAL_COMMUNITY)
Admission: RE | Admit: 2014-08-25 | Discharge: 2014-08-25 | Disposition: A | Discharge status: Home / Self Care | Payer: Medicaid Other | Source: Ambulatory Visit | Attending: Emergency Medicine | Admitting: Emergency Medicine

## 2014-08-25 ENCOUNTER — Encounter (HOSPITAL_COMMUNITY): Payer: Self-pay | Admitting: Vascular Surgery

## 2014-08-25 ENCOUNTER — Encounter (HOSPITAL_COMMUNITY): Payer: Self-pay

## 2014-08-25 DIAGNOSIS — Z01812 Encounter for preprocedural laboratory examination: Secondary | ICD-10-CM | POA: Diagnosis present

## 2014-08-25 DIAGNOSIS — R911 Solitary pulmonary nodule: Secondary | ICD-10-CM | POA: Insufficient documentation

## 2014-08-25 HISTORY — DX: Anxiety disorder, unspecified: F41.9

## 2014-08-25 LAB — HEPATIC FUNCTION PANEL
ALT: 11 U/L (ref 0–35)
AST: 16 U/L (ref 0–37)
Albumin: 3.6 g/dL (ref 3.5–5.2)
Alkaline Phosphatase: 39 U/L (ref 39–117)
Bilirubin, Direct: <0.2 mg/dL (ref 0.0–0.3)
Total Bilirubin: 0.3 mg/dL (ref 0.3–1.2)
Total Protein: 7.3 g/dL (ref 6.0–8.3)

## 2014-08-25 LAB — CBC
HEMATOCRIT: 38.4 % (ref 36.0–46.0)
Hemoglobin: 12.7 g/dL (ref 12.0–15.0)
MCH: 23.7 pg — ABNORMAL LOW (ref 26.0–34.0)
MCHC: 33.1 g/dL (ref 30.0–36.0)
MCV: 71.6 fL — AB (ref 78.0–100.0)
Platelets: 194 10*3/uL (ref 150–400)
RBC: 5.36 MIL/uL — ABNORMAL HIGH (ref 3.87–5.11)
RDW: 14.1 % (ref 11.5–15.5)
WBC: 11 10*3/uL — ABNORMAL HIGH (ref 4.0–10.5)

## 2014-08-25 LAB — BASIC METABOLIC PANEL WITH GFR
Anion gap: 17 — ABNORMAL HIGH (ref 5–15)
BUN: 16 mg/dL (ref 6–23)
CO2: 22 meq/L (ref 19–32)
Calcium: 9.1 mg/dL (ref 8.4–10.5)
Chloride: 98 meq/L (ref 96–112)
Creatinine, Ser: 0.62 mg/dL (ref 0.50–1.10)
GFR calc Af Amer: >90 mL/min
GFR calc non Af Amer: >90 mL/min
Glucose, Bld: 227 mg/dL — ABNORMAL HIGH (ref 70–99)
Potassium: 3.6 meq/L — ABNORMAL LOW (ref 3.7–5.3)
Sodium: 137 meq/L (ref 137–147)

## 2014-08-25 LAB — APTT: aPTT: >200 s (ref 24–37)

## 2014-08-25 LAB — PROTIME-INR
INR: 2.4 — ABNORMAL HIGH (ref 0.00–1.49)
Prothrombin Time: 26.2 s — ABNORMAL HIGH (ref 11.6–15.2)

## 2014-08-25 NOTE — Telephone Encounter (Signed)
Called spoke with Ebony Hail in short stay and made aware per leslie she spoke with Suanne Marker in [place] earlier and made them aware this is cancelled. Nothing further needed

## 2014-08-25 NOTE — Progress Notes (Signed)
Anesthesia Chart Review:  Patient is a 53 year old female scheduled for video bronchoscopy with endobronchial navigation tomorrow by Dr. Lamonte Sakai. PAT was on 08/25/14.  She was admitted to [facility] on 08/12/14 with finding of RUL lung lesion with ipsilateral and subcarinal lymphadenopathy when she presented to the ED with back and neck pain.  Other history includes HTN, DM2, nephrolithiasis, anxiety, arthritis, smoking. BMI is consistent with morbid obesity. No PCP is listed.   EKG on 08/12/14 showed NSR.  Meds include Ativan, MVI, Seroquel, amlodipine, ASA 81 mg, Neurontin, glipizide, HCTZ, indomethacin, Lantus, lisinopril, metformin, pioglitazone.    PFTs on 08/14/14 showed: FVC 2.49 (91%), FEV1 1.89 (84%), FEF 25-75% 1.52 (54%).   08/12/14 CXR showed: 2.4 cm RML nodule.  Preoperative labs noted.  PT/INR 26.2/2.40. PTT > 200.  Her PAT already called Dr. Agustina Caroli office regarding abnormal labs.  I also sent him a high priority staff message.  It's unclear if results are due to lab error or note. I added a HFP which was WNL. I also entered a STAT PT/PTT for tomorrow (in hopes to evaluate if results were due to lab error); however, patient's PAT RN tells me that Dr. Agustina Caroli office called and said that he is planning to reschedule her procedure.    George Hugh [facility] Short Stay Center/Anesthesiology Phone [phone] 08/25/2014 3:47 PM

## 2014-08-25 NOTE — Telephone Encounter (Signed)
Per RB needs to have pt stop ASA, BC powers, ETOH  Needs to be seen this wk in the office if possible  I notified Tye Maryland and Suanne Marker in the OR  Pt scheduled to see RB on Friday to discuss further

## 2014-08-25 NOTE — Progress Notes (Signed)
PAT    Labs called to Dr Sudie Bailey office. ptt>200

## 2014-08-25 NOTE — Telephone Encounter (Signed)
Spoke with Sabrina Adkins  She states that the pt's labs were abn and needs RB to review before surgery tomorrow  PTT >200  Please advise thanks!

## 2014-08-26 ENCOUNTER — Ambulatory Visit (HOSPITAL_COMMUNITY): Admission: RE | Admit: 2014-08-26 | Payer: Medicaid Other | Source: Ambulatory Visit | Admitting: Emergency Medicine

## 2014-08-26 ENCOUNTER — Encounter (HOSPITAL_COMMUNITY): Admission: RE | Payer: Self-pay | Source: Ambulatory Visit

## 2014-08-26 SURGERY — VIDEO BRONCHOSCOPY WITH ENDOBRONCHIAL NAVIGATION
Anesthesia: General

## 2014-08-28 ENCOUNTER — Ambulatory Visit (INDEPENDENT_AMBULATORY_CARE_PROVIDER_SITE_OTHER): Payer: Medicaid Other | Admitting: Emergency Medicine

## 2014-08-28 ENCOUNTER — Telehealth: Payer: Self-pay | Admitting: Emergency Medicine

## 2014-08-28 ENCOUNTER — Encounter: Payer: Self-pay | Admitting: Emergency Medicine

## 2014-08-28 VITALS — BP 154/88 | HR 83 | Ht 65.0 in | Wt 259.0 lb

## 2014-08-28 DIAGNOSIS — R911 Solitary pulmonary nodule: Secondary | ICD-10-CM

## 2014-08-28 DIAGNOSIS — D66 Hereditary factor VIII deficiency: Secondary | ICD-10-CM | POA: Insufficient documentation

## 2014-08-28 NOTE — Telephone Encounter (Signed)
Pt decided not to leave a message.  Holly D Pryor ° °

## 2014-08-28 NOTE — Patient Instructions (Signed)
We will arrange for you to see Hematology to assess and explain your blood thinning labs.  We will schedule your bronchoscopy after you have seen hematology.  Follow with Dr Lamonte Sakai in 1 month

## 2014-08-28 NOTE — Assessment & Plan Note (Addendum)
Etiology of her elevated PT and PTT is unclear. She has a family hx coagulopathy but not bleeding. 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 I don';t see any meds that would cause this.  - I believe she needs to see hematology for w/u before she is safe to have a procedure. Will schedule and perform bx's as soon as I get recs on how to do so safely, ? With FFP, etc.  - rov 1

## 2014-08-28 NOTE — Assessment & Plan Note (Signed)
Her CT is suspicious for primary lung cancer. She needs navigational bronchoscopy and EBUS to stage her nodes. Schedule this soon as I get the recommendations from hematology regarding her elevated PT and PTT

## 2014-08-28 NOTE — Progress Notes (Signed)
Subjective:    Patient ID: Sabrina Adkins, female    DOB: 1961-04-07, 53 y.o.   MRN: [id number]  HPI 53 yo smoker (15 pk-yrs), hx HTN, DM. She was seen in the ED on 08/12/14 for neck pain. A CXR showed a RML nodule. CT scan confirmed this and 2 other GG nodules. She presents today to discuss bx.  She denies cough or dyspnea.   ROV 08/28/14 -- follow visit for pulmonary nodules. We had planned to perform navigational bronchoscopy and EBUS, but this was postponed because her PT and PTT were elevated. She has not had any medications that would cause this. Her LFTs are normal. She tells me that her mother has a clotting disorder, but she believes this was a hypercoag state.    Review of Systems  Constitutional: Negative for fever and unexpected weight change.  HENT: Negative for congestion, dental problem, ear pain, nosebleeds, postnasal drip, rhinorrhea, sinus pressure, sneezing, sore throat and trouble swallowing.   Eyes: Negative for redness and itching.  Respiratory: Negative for cough, chest tightness, shortness of breath and wheezing.   Cardiovascular: Negative for palpitations and leg swelling.  Gastrointestinal: Negative for nausea and vomiting.  Genitourinary: Negative for dysuria.  Musculoskeletal: Positive for back pain. Negative for joint swelling.  Skin: Negative for rash.  Neurological: Positive for headaches.  Hematological: Does not bruise/bleed easily.  Psychiatric/Behavioral: Negative for dysphoric mood. The patient is not nervous/anxious.       Objective:   Physical Exam  Filed Vitals:   08/28/14 1501  BP: 154/88  Pulse: 83  Height: 5\' 5"  (1.651 m)  Weight: 259 lb (117.482 kg)  SpO2: 98%   Gen: Pleasant, obese, in no distress,  normal affect  ENT: No lesions,  mouth clear,  oropharynx clear, no postnasal drip  Neck: No JVD, no TMG, no carotid bruits  Lungs: No use of accessory muscles, clear without rales or rhonchi  Cardiovascular: RRR, heart sounds normal,  no murmur or gallops, no peripheral edema  Musculoskeletal: No deformities, no cyanosis or clubbing  Neuro: alert, non focal  Skin: Warm, no lesions or rashes   CT 08/12/14 --  COMPARISON: None.  FINDINGS:  THORACIC INLET/BODY WALL:  No acute abnormality.  MEDIASTINUM:  Normal heart size. No pericardial effusion. There is atherosclerosis  of the aorta great vessels but no acute vascular findings. Proximal  LAD atherosclerotic calcification. Subcarinal lymphadenopathy  measuring 24 mm in diameter. Prevascular lymphadenopathy measuring  26 mm short axis. No visible supraclavicular adenopathy.  LUNG WINDOWS:  2.2 cm pulmonary nodule in the right middle lobe with lobulated and  spiculated margins. The mass has broad contact with the mildly  deformed minor fissure. More inferiorly within the right middle lobe  is ground-glass ill-defined nodule measuring 13 mm. There is 16 mm  ground-glass nodule in the apical right upper lobe2 mm nodule in the  superior segment right upper lobe, along the major fissure.  No acute findings such as pneumonia, edema, effusion, or  pneumothorax.  UPPER ABDOMEN:  2.7 cm low-density nodule in the right adrenal gland consistent with  adenoma. Two left adrenal nodules, also low-density, measuring up to  19 mm.  OSSEOUS:  No acute fracture. No suspicious lytic or blastic lesions.  IMPRESSION:  1. 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 The nodule has  broad contact with the minor fissure. There is also a 6 mm  ground-glass nodule also in the right middle lobe.  2.  16 mm ground-glass nodule in the right upper lobe, suspicious for  low-grade adenocarcinoma given #1.  3. 4 mm nodule along the upper right major fissure.  4. Atherosclerosis, including the coronary arteries.  5. Bilateral adrenal adenomas.        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 She has a family hx coagulopathy but not bleeding. 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 I don';t see any meds that would cause this.  - I believe she needs to see hematology for w/u before she is safe to have a procedure. Will schedule and perform bx's as soon as I get recs on how to do so safely, ? With FFP, etc.  - rov 1  Pulmonary nodule Her CT is suspicious for primary lung cancer. She needs navigational bronchoscopy and EBUS to stage her nodes. Schedule this soon as I get the recommendations from hematology regarding her elevated PT and PTT

## 2014-08-31 ENCOUNTER — Telehealth: Payer: Self-pay | Admitting: Hematology and Oncology

## 2014-08-31 NOTE — Telephone Encounter (Signed)
LEFT MESSAGE FOR PATIENT AND GAVE NP APPT FOR 09/23 @ 12:30 W/DR. Buckhorn. CONTACT INFORMATION LEFT FOR PATIENT TO RETURN CALL TO CONFIRM MESSAGE/APPT.

## 2014-09-02 ENCOUNTER — Ambulatory Visit: Payer: Medicaid Other

## 2014-09-02 ENCOUNTER — Ambulatory Visit (HOSPITAL_BASED_OUTPATIENT_CLINIC_OR_DEPARTMENT_OTHER): Payer: Medicaid Other

## 2014-09-02 ENCOUNTER — Telehealth: Payer: Self-pay | Admitting: Hematology and Oncology

## 2014-09-02 ENCOUNTER — Encounter: Payer: Self-pay | Admitting: Hematology and Oncology

## 2014-09-02 ENCOUNTER — Ambulatory Visit (HOSPITAL_BASED_OUTPATIENT_CLINIC_OR_DEPARTMENT_OTHER): Payer: Medicaid Other | Admitting: Hematology and Oncology

## 2014-09-02 VITALS — BP 176/92 | HR 73 | Temp 98.4°F | Resp 20 | Ht 65.0 in | Wt 258.5 lb

## 2014-09-02 DIAGNOSIS — D689 Coagulation defect, unspecified: Secondary | ICD-10-CM

## 2014-09-02 DIAGNOSIS — R918 Other nonspecific abnormal finding of lung field: Secondary | ICD-10-CM

## 2014-09-02 DIAGNOSIS — F17200 Nicotine dependence, unspecified, uncomplicated: Secondary | ICD-10-CM

## 2014-09-02 DIAGNOSIS — Z720 Tobacco use: Secondary | ICD-10-CM

## 2014-09-02 DIAGNOSIS — R222 Localized swelling, mass and lump, trunk: Secondary | ICD-10-CM

## 2014-09-02 HISTORY — DX: Coagulation defect, unspecified: D68.9

## 2014-09-02 LAB — CBC WITH DIFFERENTIAL/PLATELET
BASO%: 0.6 % (ref 0.0–2.0)
Basophils Absolute: 0.1 10*3/uL (ref 0.0–0.1)
EOS ABS: 0.2 10*3/uL (ref 0.0–0.5)
EOS%: 1.4 % (ref 0.0–7.0)
HEMATOCRIT: 41.6 % (ref 34.8–46.6)
HGB: 12.8 g/dL (ref 11.6–15.9)
LYMPH%: 28.1 % (ref 14.0–49.7)
MCH: 22.9 pg — ABNORMAL LOW (ref 25.1–34.0)
MCHC: 30.8 g/dL — AB (ref 31.5–36.0)
MCV: 74.4 fL — ABNORMAL LOW (ref 79.5–101.0)
MONO#: 0.6 10*3/uL (ref 0.1–0.9)
MONO%: 5.2 % (ref 0.0–14.0)
NEUT%: 64.7 % (ref 38.4–76.8)
NEUTROS ABS: 7.8 10*3/uL — AB (ref 1.5–6.5)
Platelets: 225 10*3/uL (ref 145–400)
RBC: 5.59 10*6/uL — ABNORMAL HIGH (ref 3.70–5.45)
RDW: 14.4 % (ref 11.2–14.5)
WBC: 12.1 10*3/uL — AB (ref 3.9–10.3)
lymph#: 3.4 10*3/uL — ABNORMAL HIGH (ref 0.9–3.3)

## 2014-09-02 NOTE — Assessment & Plan Note (Signed)
Clinical appearance is highly suspicious for lung cancer. Biopsies on hold until the causes of coagulopathy can be determined.

## 2014-09-02 NOTE — Progress Notes (Signed)
[facility] CONSULT NOTE  Patient Care Team: Provider Default, MD as PCP - General  CHIEF COMPLAINTS/PURPOSE OF CONSULTATION:  Lung mass, coagulopathy  HISTORY OF PRESENTING ILLNESS:  Sabrina Adkins 53 y.o. female is here because of abnormal coagulation study. The patient had chronic neck pain and had imaging study performed which showed incidental finding of abnormal lung window. CT scan of chest dated 08/12/2014 come from lung mass and CT guided biopsy was recommended. On the pre-operative admission testing, coagulation profile was abnormal and decision was made to not pursue biopsy until the cause of coagulopathy is determined. The patient numerous surgeries in the past and never had excessive bleeding. She did have history of menorrhagia requiring D&C. She had 4 natural childbirth. The patient denies any focal transfusions in the past. Recently, she have vaginal spotting after sexual intercourse. She has easy bruising. The patient denies any recent signs or symptoms of bleeding such as spontaneous epistaxis, hematuria or hematochezia. She denies recent hemoptysis. She denies any chest symptoms. The patient take aspirin to prevent heart disease. Interestingly, many years ago, she had spontaneous right lower extremity DVT, thought to be provoked by immobilization. She was on warfarin for almost 3 years. She was suspected to be a thrombophilia as her mother also have diagnosis of blood clots in the past. MEDICAL HISTORY:  Past Medical History  Diagnosis Date  . Hypertension   . Arthritis   . Diabetes mellitus without complication   . Anxiety   . Chronic kidney disease     stones  . Clotting disorder     right leg DVT  . Coagulopathy 09/02/2014    SURGICAL HISTORY: Past Surgical History  Procedure Laterality Date  . Cholecystectomy    . Tubal ligation    . Carpal tunnel release      SOCIAL HISTORY: History   Social History  . Marital Status: Single   Spouse Name: N/A    Number of Children: N/A  . Years of Education: N/A   Occupational History  . Not on file.   Social History Main Topics  . Smoking status: Current Every Day Smoker -- 0.50 packs/day for 30 years    Types: Cigarettes  . Smokeless tobacco: Never Used  . Alcohol Use: Yes     Comment: occ  . Drug Use: Yes    Special: Marijuana  . Sexual Activity: Not on file   Other Topics Concern  . Not on file   Social History Narrative  . No narrative on file    FAMILY HISTORY: Family History  Problem Relation Age of Onset  . Heart disease Father   . Clotting disorder Mother   . Rheum arthritis Mother     ALLERGIES:  is allergic to aspirin; ibuprofen; penicillins; orange juice; iodine; and latex.  MEDICATIONS:  Current Outpatient Prescriptions  Medication Sig Dispense Refill  . amLODipine (NORVASC) 5 MG tablet Take 1 tablet (5 mg total) by mouth daily.  30 tablet  1  . aspirin EC 81 MG tablet Take 81 mg by mouth daily.      . Aspirin-Salicylamide-Caffeine (BC HEADACHE POWDER PO) Take 1 Package by mouth daily as needed (for pain).      . Cinnamon 500 MG TABS Take 2 tablets by mouth daily.      Marland Kitchen gabapentin (NEURONTIN) 100 MG capsule Take 1 capsule (100 mg total) by mouth 3 (three) times daily.  90 capsule  0  . glipiZIDE (GLUCOTROL) 5 MG tablet Take 2 tablets (10  mg total) by mouth 2 (two) times daily before a meal.  60 tablet  1  . hydrochlorothiazide (HYDRODIURIL) 25 MG tablet Take 1 tablet (25 mg total) by mouth daily.  30 tablet  1  . HYDROcodone-acetaminophen (NORCO/VICODIN) 5-325 MG per tablet Take 1-2 tablets by mouth every 6 (six) hours as needed for moderate pain.  30 tablet  0  . indomethacin (INDOCIN) 50 MG capsule Take 50 mg by mouth 2 (two) times daily with a meal.      . insulin glargine (LANTUS) 100 UNIT/ML injection Inject 0.35 mLs (35 Units total) into the skin at bedtime.  10 mL  11  . lisinopril (PRINIVIL,ZESTRIL) 40 MG tablet Take 1 tablet (40 mg  total) by mouth daily.  30 tablet  1  . LORazepam (ATIVAN) 0.5 MG tablet Take 1 tablet (0.5 mg total) by mouth 2 (two) times daily as needed. For nerves  30 tablet  0  . metFORMIN (GLUCOPHAGE) 500 MG tablet Take 2 tablets (1,000 mg total) by mouth 2 (two) times daily with a meal.  60 tablet  0  . Multiple Vitamin (MULTIVITAMIN WITH MINERALS) TABS tablet Take 1 tablet by mouth daily.      . pioglitazone (ACTOS) 30 MG tablet Take 1 tablet (30 mg total) by mouth daily.  30 tablet  0  . QUEtiapine (SEROQUEL) 50 MG tablet Take 1 tablet (50 mg total) by mouth at bedtime.  30 tablet  0   No current facility-administered medications for this visit.    REVIEW OF SYSTEMS:   Constitutional: Denies fevers, chills or abnormal night sweats Eyes: Denies blurriness of vision, double vision or watery eyes Ears, nose, mouth, throat, and face: Denies mucositis or sore throat Respiratory: Denies cough, dyspnea or wheezes Cardiovascular: Denies palpitation, chest discomfort or lower extremity swelling Gastrointestinal:  Denies nausea, heartburn or change in bowel habits Skin: Denies abnormal skin rashes Lymphatics: Denies new lymphadenopathy  Neurological:Denies numbness, tingling or new weaknesses Behavioral/Psych: Mood is stable, no new changes  All other systems were reviewed with the patient and are negative.  PHYSICAL EXAMINATION: ECOG PERFORMANCE STATUS: 0 - Asymptomatic  Filed Vitals:   09/02/14 1309  BP: 176/92  Pulse: 73  Temp: 98.4 F (36.9 C)  Resp: 20   Filed Weights   09/02/14 1309  Weight: 258 lb 8 oz (117.255 kg)    GENERAL:alert, no distress and comfortable. She is morbidly obese SKIN: skin color, texture, turgor are normal, no rashes or significant lesions EYES: normal, conjunctiva are pink and non-injected, sclera clear OROPHARYNX:no exudate, no erythema and lips, buccal mucosa, and tongue normal  NECK: supple, thyroid normal size, non-tender, without nodularity LYMPH:  no  palpable lymphadenopathy in the cervical, axillary or inguinal LUNGS: clear to auscultation and percussion with normal breathing effort HEART: regular rate & rhythm and no murmurs and no lower extremity edema ABDOMEN:abdomen soft, non-tender and normal bowel sounds Musculoskeletal:no cyanosis of digits and no clubbing  PSYCH: alert & oriented x 3 with fluent speech NEURO: no focal motor/sensory deficits  LABORATORY DATA:  I have reviewed the data as listed Lab Results  Component Value Date   WBC 12.1* 09/02/2014   HGB 12.8 09/02/2014   HCT 41.6 09/02/2014   MCV 74.4* 09/02/2014   PLT 225 09/02/2014    Recent Labs  08/12/14 1524 08/25/14 1052  NA 134* 137  K 3.6* 3.6*  CL 95* 98  CO2 26 22  GLUCOSE 334* 227*  BUN 14 16  CREATININE 0.78 0.62  CALCIUM 9.5 9.1  GFRNONAA >90 >90  GFRAA >90 >90  PROT  --  7.3  ALBUMIN  --  3.6  AST  --  16  ALT  --  11  ALKPHOS  --  39  BILITOT  --  0.3  BILIDIR  --  <0.2  IBILI  --  NOT CALCULATED    RADIOGRAPHIC STUDIES: I have personally reviewed the radiological images as listed and agreed with the findings in the report. Dg Chest 2 View  08/12/2014   CLINICAL DATA:  Chest pain  EXAM: CHEST  2 VIEW  COMPARISON:  None.  FINDINGS: Cardiac shadow is within normal limits. The lungs are clear bilaterally. In the right mid lung projecting in the right middle lobe there is a 2.4 cm rounded nodule identified. No focal infiltrate or sizable effusion is seen. No definitive hilar adenopathy is seen. No bony abnormality is noted.  IMPRESSION: 2.4 cm nodule in the right middle lobe. CT of the chest is recommended for further evaluation.  These results were called by telephone at the time of interpretation on 08/12/2014 at 5:00 pm to [facility], [place], who verbally acknowledged these results.   Electronically Signed   By: Inez Catalina M.D.   On: 08/12/2014 17:03   Ct Chest Wo Contrast  08/12/2014   CLINICAL DATA:  Neck and back pain.  Pulmonary nodule.   EXAM: CT CHEST WITHOUT CONTRAST  TECHNIQUE: Multidetector CT imaging of the chest was performed following the standard protocol without IV contrast.  COMPARISON:  None.  FINDINGS: THORACIC INLET/BODY WALL:  No acute abnormality.  MEDIASTINUM:  Normal heart size. No pericardial effusion. There is atherosclerosis of the aorta great vessels but no acute vascular findings. Proximal LAD atherosclerotic calcification. Subcarinal lymphadenopathy measuring 24 mm in diameter. Prevascular lymphadenopathy measuring 26 mm short axis. No visible supraclavicular adenopathy.  LUNG WINDOWS:  2.2 cm pulmonary nodule in the right middle lobe with lobulated and spiculated margins. The mass has broad contact with the mildly deformed minor fissure. More inferiorly within the right middle lobe is ground-glass ill-defined nodule measuring 13 mm. There is 16 mm ground-glass nodule in the apical right upper lobe2 mm nodule in the superior segment right upper lobe, along the major fissure.  No acute findings such as pneumonia, edema, effusion, or pneumothorax.  UPPER ABDOMEN:  2.7 cm low-density nodule in the right adrenal gland consistent with adenoma. Two left adrenal nodules, also low-density, measuring up to 19 mm.  OSSEOUS:  No acute fracture.  No suspicious lytic or blastic lesions.  IMPRESSION: 1. 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 The nodule has broad contact with the minor fissure. There is also a 6 mm ground-glass nodule also in the right middle lobe. 2. 16 mm ground-glass nodule in the right upper lobe, suspicious for low-grade adenocarcinoma given #1. 3. 4 mm nodule along the upper right major fissure. 4. Atherosclerosis, including the coronary arteries. 5. Bilateral adrenal adenomas.   Electronically Signed   By: Jorje Guild M.D.   On: 08/12/2014 18:38    ASSESSMENT & PLAN:  Coagulopathy The patient had history of menorrhagia in the past.  She also has history of DVT. The cause of coagulopathy is unknown. I will order an additional workup for this. I recommend she discontinued taking indomethacin that could increase the risk of bleeding  Pulmonary mass Clinical appearance is highly suspicious for lung cancer. Biopsies on hold until the causes of coagulopathy can be  determined.  Tobacco abuse I spent some time counseling the patient the importance of tobacco cessation. she is currently attempting to quit on her own     Orders Placed This Encounter  Procedures  . CBC with Differential    Standing Status: Future     Number of Occurrences: 1     Standing Expiration Date: 10/07/2015  . Protime-INR    Standing Status: Future     Number of Occurrences: 1     Standing Expiration Date: 10/07/2015  . APTT    Standing Status: Future     Number of Occurrences: 1     Standing Expiration Date: 10/07/2015  . Fibrinogen    Standing Status: Future     Number of Occurrences: 1     Standing Expiration Date: 10/07/2015  . Ptt factor inhibitor (mixing study)    Standing Status: Future     Number of Occurrences: 1     Standing Expiration Date: 10/07/2015  . Pt factor inhibitor (mixing study)    Standing Status: Future     Number of Occurrences: 1     Standing Expiration Date: 10/07/2015  . Factor 10 assay    Standing Status: Future     Number of Occurrences: 1     Standing Expiration Date: 10/07/2015  . Factor 13 activity    Standing Status: Future     Number of Occurrences: 1     Standing Expiration Date: 10/07/2015    All questions were answered. The patient knows to call the clinic with any problems, questions or concerns. I spent 40 minutes counseling the patient face to face. The total time spent in the appointment was 60 minutes and more than 50% was on counseling.     [facility], [place] [facility], MD 09/02/2014 3:14 PM

## 2014-09-02 NOTE — Telephone Encounter (Signed)
gv and printed appt sched and avs for pt for OCT...sent pt to lab

## 2014-09-02 NOTE — Assessment & Plan Note (Signed)
The patient had history of menorrhagia in the past. She also has history of DVT. The cause of coagulopathy is unknown. I will order an additional workup for this. I recommend she discontinued taking indomethacin that could increase the risk of bleeding

## 2014-09-02 NOTE — Telephone Encounter (Signed)
gv and printed appt sched and av sfor pt for OCT

## 2014-09-02 NOTE — Assessment & Plan Note (Signed)
I spent some time counseling the patient the importance of tobacco cessation. she is currently attempting to quit on her own 

## 2014-09-09 ENCOUNTER — Telehealth: Payer: Self-pay | Admitting: *Deleted

## 2014-09-09 ENCOUNTER — Telehealth: Payer: Self-pay | Admitting: Hematology and Oncology

## 2014-09-09 ENCOUNTER — Telehealth: Payer: Self-pay | Admitting: Emergency Medicine

## 2014-09-09 DIAGNOSIS — R918 Other nonspecific abnormal finding of lung field: Secondary | ICD-10-CM

## 2014-09-09 LAB — APTT: aPTT: 29 seconds (ref 24–37)

## 2014-09-09 LAB — FACTOR 13 ACTIVITY: FACTOR XIII, QUALITATIVE: NEGATIVE

## 2014-09-09 LAB — PROTHROMBIN TIME
INR: 1.08 (ref <1.50)
Prothrombin Time: 14 seconds (ref 11.6–15.2)

## 2014-09-09 LAB — FACTOR 10 ASSAY: Factor X Activity: 123 % (ref 72–134)

## 2014-09-09 LAB — PT FACTOR INHIBITOR (MIXING STUDY)

## 2014-09-09 LAB — FIBRINOGEN: Fibrinogen: 379 mg/dL (ref 204–475)

## 2014-09-09 LAB — PTT FACTOR INHIBITOR (MIXING STUDY)

## 2014-09-09 NOTE — Telephone Encounter (Signed)
Message copied by Cathlean Cower on Wed Sep 09, 2014  1:48 PM ------      Message from: [facility] [place], [place]: Wed Sep 09, 2014  1:12 PM      Regarding: please call Dr. Agustina Caroli office       I routed a note to him. Please call his office to make sure they are aware her repeat Tests are normal and she can go for her biopsy ------

## 2014-09-09 NOTE — Telephone Encounter (Signed)
Left message w/ Daleen Snook at Dr. Agustina Caroli office asking her to inform him of note below routed to him.

## 2014-09-09 NOTE — Telephone Encounter (Signed)
Will forward to RB as FYI. RB is gap and night float for [place] today.

## 2014-09-09 NOTE — Telephone Encounter (Signed)
I reviewed the blood test result. Repeat PT and a PTT came back normal. I suspect the abnormal test that was done preoperatively was spurious result. I have cancelled her return appointment I will inform her referring physician that she can proceed for CT-guided biopsy.

## 2014-09-11 ENCOUNTER — Ambulatory Visit: Payer: Medicaid Other | Admitting: Hematology and Oncology

## 2014-09-11 NOTE — Telephone Encounter (Signed)
ENB/EBUS@[facility]  09/23/14@8 :30am pt and RB aware Joellen Jersey

## 2014-09-11 NOTE — Telephone Encounter (Signed)
Need to reschedule the pt for ENB + EBUS now that coag question has been addressed.

## 2014-09-15 ENCOUNTER — Encounter (HOSPITAL_COMMUNITY): Payer: Self-pay | Admitting: Pharmacy Technician

## 2014-09-15 ENCOUNTER — Encounter (HOSPITAL_COMMUNITY): Payer: Self-pay | Admitting: Emergency Medicine

## 2014-09-15 ENCOUNTER — Emergency Department (HOSPITAL_COMMUNITY)
Admission: EM | Admit: 2014-09-15 | Discharge: 2014-09-15 | Disposition: A | Discharge status: Home / Self Care | Payer: Medicaid Other | Attending: Emergency Medicine | Admitting: Emergency Medicine

## 2014-09-15 DIAGNOSIS — Z720 Tobacco use: Secondary | ICD-10-CM | POA: Insufficient documentation

## 2014-09-15 DIAGNOSIS — M1990 Unspecified osteoarthritis, unspecified site: Secondary | ICD-10-CM | POA: Insufficient documentation

## 2014-09-15 DIAGNOSIS — Z7982 Long term (current) use of aspirin: Secondary | ICD-10-CM | POA: Diagnosis not present

## 2014-09-15 DIAGNOSIS — Z794 Long term (current) use of insulin: Secondary | ICD-10-CM | POA: Insufficient documentation

## 2014-09-15 DIAGNOSIS — Z91040 Latex allergy status: Secondary | ICD-10-CM | POA: Diagnosis not present

## 2014-09-15 DIAGNOSIS — I129 Hypertensive chronic kidney disease with stage 1 through stage 4 chronic kidney disease, or unspecified chronic kidney disease: Secondary | ICD-10-CM | POA: Insufficient documentation

## 2014-09-15 DIAGNOSIS — M542 Cervicalgia: Secondary | ICD-10-CM

## 2014-09-15 DIAGNOSIS — E119 Type 2 diabetes mellitus without complications: Secondary | ICD-10-CM | POA: Diagnosis not present

## 2014-09-15 DIAGNOSIS — Z862 Personal history of diseases of the blood and blood-forming organs and certain disorders involving the immune mechanism: Secondary | ICD-10-CM | POA: Insufficient documentation

## 2014-09-15 DIAGNOSIS — Z880 Allergy status to penicillin: Secondary | ICD-10-CM | POA: Diagnosis not present

## 2014-09-15 DIAGNOSIS — M79601 Pain in right arm: Secondary | ICD-10-CM | POA: Diagnosis not present

## 2014-09-15 DIAGNOSIS — Z79899 Other long term (current) drug therapy: Secondary | ICD-10-CM | POA: Diagnosis not present

## 2014-09-15 DIAGNOSIS — Z87442 Personal history of urinary calculi: Secondary | ICD-10-CM | POA: Diagnosis not present

## 2014-09-15 DIAGNOSIS — N189 Chronic kidney disease, unspecified: Secondary | ICD-10-CM | POA: Diagnosis not present

## 2014-09-15 DIAGNOSIS — M62838 Other muscle spasm: Secondary | ICD-10-CM | POA: Diagnosis not present

## 2014-09-15 DIAGNOSIS — F419 Anxiety disorder, unspecified: Secondary | ICD-10-CM | POA: Insufficient documentation

## 2014-09-15 MED ORDER — DIAZEPAM 5 MG PO TABS: 5.0000 mg | ORAL_TABLET | Freq: Two times a day (BID) | ORAL | Status: DC

## 2014-09-15 MED ORDER — DIAZEPAM 5 MG/ML IJ SOLN
5.0000 mg | Freq: Once | INTRAMUSCULAR | Status: AC
  Administered 2014-09-15: 5 mg via INTRAMUSCULAR
  Filled 2014-09-15: qty 2

## 2014-09-15 MED ORDER — OXYCODONE-ACETAMINOPHEN 5-325 MG PO TABS: 1.0000 | ORAL_TABLET | ORAL | Status: DC | PRN

## 2014-09-15 MED ORDER — MORPHINE SULFATE 4 MG/ML IJ SOLN
4.0000 mg | Freq: Once | INTRAMUSCULAR | Status: AC
Start: 2014-09-15 — End: 2014-09-15
  Administered 2014-09-15: 4 mg via INTRAMUSCULAR
  Filled 2014-09-15: qty 1

## 2014-09-15 MED ORDER — DIAZEPAM 5 MG/ML IJ SOLN: 5.0000 mg | Freq: Once | INTRAMUSCULAR | Status: DC

## 2014-09-15 NOTE — ED Notes (Signed)
Presents with right sided neck pain with radiation into right arm and right ear and back of head. Pain began in September and has been intermittent. She has a biopsy  For her lung scheduled for the 14. Nothing makes pain better. Nothing makes pain worse. Reports some numbness and tingling to fingers on right side. Pt is tearful and stats, "It feels like my head too heavy when it starts hurting. Movement of neck from side to side makes pain worse.

## 2014-09-15 NOTE — Discharge Instructions (Signed)
Take the prescribed medication as directed. Follow-up with orthopedics for further management of your neck pain. Return to the ED for new or worsening symptoms.
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
## 2014-09-16 NOTE — ED Provider Notes (Signed)
Medical screening examination/treatment/procedure(s) were performed by non-physician practitioner and as supervising physician I was immediately available for consultation/collaboration.   EKG Interpretation None        Francine Graven, DO 09/16/14 1625

## 2014-09-22 ENCOUNTER — Ambulatory Visit: Payer: Medicaid Other | Admitting: Emergency Medicine

## 2014-09-22 ENCOUNTER — Telehealth: Payer: Self-pay | Admitting: Emergency Medicine

## 2014-09-22 ENCOUNTER — Encounter (HOSPITAL_COMMUNITY)
Admission: RE | Admit: 2014-09-22 | Discharge: 2014-09-22 | Disposition: A | Discharge status: Home / Self Care | Payer: Medicaid Other | Source: Ambulatory Visit | Attending: Emergency Medicine | Admitting: Emergency Medicine

## 2014-09-22 ENCOUNTER — Encounter (HOSPITAL_COMMUNITY): Payer: Self-pay

## 2014-09-22 DIAGNOSIS — D3502 Benign neoplasm of left adrenal gland: Secondary | ICD-10-CM | POA: Diagnosis not present

## 2014-09-22 DIAGNOSIS — F419 Anxiety disorder, unspecified: Secondary | ICD-10-CM | POA: Diagnosis not present

## 2014-09-22 DIAGNOSIS — Z720 Tobacco use: Secondary | ICD-10-CM | POA: Diagnosis not present

## 2014-09-22 DIAGNOSIS — E119 Type 2 diabetes mellitus without complications: Secondary | ICD-10-CM | POA: Diagnosis not present

## 2014-09-22 DIAGNOSIS — D3501 Benign neoplasm of right adrenal gland: Secondary | ICD-10-CM | POA: Diagnosis not present

## 2014-09-22 DIAGNOSIS — C342 Malignant neoplasm of middle lobe, bronchus or lung: Secondary | ICD-10-CM | POA: Diagnosis not present

## 2014-09-22 DIAGNOSIS — R918 Other nonspecific abnormal finding of lung field: Secondary | ICD-10-CM | POA: Diagnosis present

## 2014-09-22 DIAGNOSIS — F319 Bipolar disorder, unspecified: Secondary | ICD-10-CM | POA: Diagnosis not present

## 2014-09-22 DIAGNOSIS — I10 Essential (primary) hypertension: Secondary | ICD-10-CM | POA: Diagnosis not present

## 2014-09-22 DIAGNOSIS — I7090 Unspecified atherosclerosis: Secondary | ICD-10-CM | POA: Diagnosis not present

## 2014-09-22 HISTORY — DX: Shortness of breath: R06.02

## 2014-09-22 HISTORY — DX: Depression, unspecified: F32.A

## 2014-09-22 HISTORY — DX: Bipolar disorder, unspecified: F31.9

## 2014-09-22 HISTORY — DX: Major depressive disorder, single episode, unspecified: F32.9

## 2014-09-22 HISTORY — DX: Acute embolism and thrombosis of unspecified deep veins of unspecified lower extremity: I82.409

## 2014-09-22 HISTORY — DX: Pneumonia, unspecified organism: J18.9

## 2014-09-22 HISTORY — DX: Gastro-esophageal reflux disease without esophagitis: K21.9

## 2014-09-22 LAB — CBC
HEMATOCRIT: 41.3 % (ref 36.0–46.0)
HEMOGLOBIN: 13.8 g/dL (ref 12.0–15.0)
MCH: 23.6 pg — ABNORMAL LOW (ref 26.0–34.0)
MCHC: 33.4 g/dL (ref 30.0–36.0)
MCV: 70.6 fL — AB (ref 78.0–100.0)
Platelets: 240 10*3/uL (ref 150–400)
RBC: 5.85 MIL/uL — ABNORMAL HIGH (ref 3.87–5.11)
RDW: 13.6 % (ref 11.5–15.5)
WBC: 13.9 10*3/uL — ABNORMAL HIGH (ref 4.0–10.5)

## 2014-09-22 LAB — BASIC METABOLIC PANEL
Anion gap: 18 — ABNORMAL HIGH (ref 5–15)
BUN: 15 mg/dL (ref 6–23)
CHLORIDE: 95 meq/L — AB (ref 96–112)
CO2: 22 meq/L (ref 19–32)
CREATININE: 0.76 mg/dL (ref 0.50–1.10)
Calcium: 9.8 mg/dL (ref 8.4–10.5)
GFR calc Af Amer: >90 mL/min (ref >90)
GFR calc non Af Amer: >90 mL/min (ref >90)
Glucose, Bld: 271 mg/dL — ABNORMAL HIGH (ref 70–99)
Potassium: 4.4 mEq/L (ref 3.7–5.3)
Sodium: 135 mEq/L — ABNORMAL LOW (ref 137–147)

## 2014-09-22 NOTE — Progress Notes (Signed)
I notified patient of new arrive time of 31.

## 2014-09-22 NOTE — Telephone Encounter (Signed)
No labs, thanks

## 2014-09-22 NOTE — Telephone Encounter (Signed)
[name] with pre-admit wants to know if you wants labs drawn on this prt for procedure tomorrow? [place] Bing, CMA

## 2014-09-22 NOTE — Telephone Encounter (Signed)
Called [name] and [facility] x1

## 2014-09-22 NOTE — Pre-Procedure Instructions (Addendum)
Sabrina Adkins  09/22/2014   Your procedure is scheduled on:  Wednesday, October 14.  Report to [facility] Admitting at 6:30 AM.  Call this number if you have problems the morning of surgery: [phone]   Remember:   Do not eat food or drink liquids after midnight.   Take these medicines the morning of surgery with A SIP OF WATER: amLODipine (NORVASC), gabapentin (NEURONTIN), diazepam (VALIUM).                Do not wear jewelry, make-up or nail polish.  Do not wear lotions, powders, or perfumes.   Do not shave 48 hours prior to surgery.   Do not bring valuables to the hospital.              [facility] belongings or [facility].               Contacts, dentures or bridgework may not be worn into surgery.  Leave suitcase in the car. After surgery it may be brought to your room.  For patients admitted to the hospital, discharge time is determined by your  treatment team.               Patients discharged the day of surgery will not be allowed to drive home.  Name and phone number of your driver: -   Special Instructions: Review  [facility] - Preparing For Surgery.   Please read over the following fact sheets that you were given: Pain Booklet, Coughing and Deep Breathing and Surgical Site Infection Prevention

## 2014-09-23 ENCOUNTER — Encounter (HOSPITAL_COMMUNITY): Payer: Medicaid Other | Admitting: Vascular Surgery

## 2014-09-23 ENCOUNTER — Ambulatory Visit (HOSPITAL_COMMUNITY): Payer: Medicaid Other | Admitting: Certified Registered Nurse Anesthetist

## 2014-09-23 ENCOUNTER — Encounter (HOSPITAL_COMMUNITY)
Admission: RE | Disposition: A | Discharge status: Home / Self Care | Payer: Self-pay | Source: Ambulatory Visit | Attending: Emergency Medicine

## 2014-09-23 ENCOUNTER — Ambulatory Visit (HOSPITAL_COMMUNITY): Payer: Medicaid Other

## 2014-09-23 ENCOUNTER — Encounter (HOSPITAL_COMMUNITY): Payer: Self-pay | Admitting: Certified Registered Nurse Anesthetist

## 2014-09-23 ENCOUNTER — Ambulatory Visit (HOSPITAL_COMMUNITY)
Admission: RE | Admit: 2014-09-23 | Discharge: 2014-09-23 | Disposition: A | Discharge status: Home / Self Care | Payer: Medicaid Other | Source: Ambulatory Visit | Attending: Emergency Medicine | Admitting: Emergency Medicine

## 2014-09-23 DIAGNOSIS — C342 Malignant neoplasm of middle lobe, bronchus or lung: Secondary | ICD-10-CM | POA: Insufficient documentation

## 2014-09-23 DIAGNOSIS — E119 Type 2 diabetes mellitus without complications: Secondary | ICD-10-CM | POA: Insufficient documentation

## 2014-09-23 DIAGNOSIS — D3502 Benign neoplasm of left adrenal gland: Secondary | ICD-10-CM | POA: Insufficient documentation

## 2014-09-23 DIAGNOSIS — Z9889 Other specified postprocedural states: Secondary | ICD-10-CM

## 2014-09-23 DIAGNOSIS — F319 Bipolar disorder, unspecified: Secondary | ICD-10-CM | POA: Insufficient documentation

## 2014-09-23 DIAGNOSIS — D3501 Benign neoplasm of right adrenal gland: Secondary | ICD-10-CM | POA: Insufficient documentation

## 2014-09-23 DIAGNOSIS — I10 Essential (primary) hypertension: Secondary | ICD-10-CM | POA: Diagnosis not present

## 2014-09-23 DIAGNOSIS — R911 Solitary pulmonary nodule: Secondary | ICD-10-CM | POA: Diagnosis present

## 2014-09-23 DIAGNOSIS — R918 Other nonspecific abnormal finding of lung field: Secondary | ICD-10-CM | POA: Diagnosis not present

## 2014-09-23 DIAGNOSIS — F419 Anxiety disorder, unspecified: Secondary | ICD-10-CM | POA: Insufficient documentation

## 2014-09-23 DIAGNOSIS — Z720 Tobacco use: Secondary | ICD-10-CM | POA: Insufficient documentation

## 2014-09-23 DIAGNOSIS — I7090 Unspecified atherosclerosis: Secondary | ICD-10-CM | POA: Insufficient documentation

## 2014-09-23 HISTORY — PX: VIDEO BRONCHOSCOPY WITH ENDOBRONCHIAL ULTRASOUND: SHX6177

## 2014-09-23 HISTORY — PX: VIDEO BRONCHOSCOPY WITH ENDOBRONCHIAL NAVIGATION: SHX6175

## 2014-09-23 LAB — GLUCOSE, CAPILLARY
GLUCOSE-CAPILLARY: 207 mg/dL — AB (ref 70–99)
Glucose-Capillary: 214 mg/dL — ABNORMAL HIGH (ref 70–99)

## 2014-09-23 SURGERY — BRONCHOSCOPY, WITH EBUS
Anesthesia: General

## 2014-09-23 MED ORDER — PHENYLEPHRINE HCL 10 MG/ML IJ SOLN
10.0000 mg | INTRAVENOUS | Status: DC | PRN
  Administered 2014-09-23: 40 ug/min via INTRAVENOUS

## 2014-09-23 MED ORDER — LACTATED RINGERS IV SOLN
INTRAVENOUS | Status: DC | PRN
  Administered 2014-09-23 (×2): via INTRAVENOUS

## 2014-09-23 MED ORDER — INSULIN ASPART 100 UNIT/ML ~~LOC~~ SOLN
4.0000 [IU] | Freq: Once | SUBCUTANEOUS | Status: AC
  Administered 2014-09-23: 4 [IU] via SUBCUTANEOUS
  Filled 2014-09-23: qty 0.04

## 2014-09-23 MED ORDER — PROPOFOL 10 MG/ML IV BOLUS
INTRAVENOUS | Status: AC
  Filled 2014-09-23: qty 20

## 2014-09-23 MED ORDER — LIDOCAINE HCL (CARDIAC) 20 MG/ML IV SOLN
INTRAVENOUS | Status: AC
  Filled 2014-09-23: qty 5

## 2014-09-23 MED ORDER — MIDAZOLAM HCL 5 MG/5ML IJ SOLN
INTRAMUSCULAR | Status: DC | PRN
  Administered 2014-09-23: 2 mg via INTRAVENOUS

## 2014-09-23 MED ORDER — GLYCOPYRROLATE 0.2 MG/ML IJ SOLN
INTRAMUSCULAR | Status: DC | PRN
  Administered 2014-09-23: 0.6 mg via INTRAVENOUS

## 2014-09-23 MED ORDER — FENTANYL CITRATE 0.05 MG/ML IJ SOLN
INTRAMUSCULAR | Status: DC | PRN
  Administered 2014-09-23 (×2): 50 ug via INTRAVENOUS

## 2014-09-23 MED ORDER — ONDANSETRON HCL 4 MG/2ML IJ SOLN: 4.0000 mg | Freq: Four times a day (QID) | INTRAMUSCULAR | Status: DC | PRN

## 2014-09-23 MED ORDER — PROPOFOL 10 MG/ML IV BOLUS
INTRAVENOUS | Status: DC | PRN
  Administered 2014-09-23: 200 mg via INTRAVENOUS

## 2014-09-23 MED ORDER — LACTATED RINGERS IV SOLN
INTRAVENOUS | Status: DC
  Administered 2014-09-23: 09:00:00 via INTRAVENOUS

## 2014-09-23 MED ORDER — ONDANSETRON HCL 4 MG/2ML IJ SOLN
INTRAMUSCULAR | Status: AC
  Filled 2014-09-23: qty 2

## 2014-09-23 MED ORDER — MIDAZOLAM HCL 2 MG/2ML IJ SOLN
INTRAMUSCULAR | Status: AC
  Filled 2014-09-23: qty 2

## 2014-09-23 MED ORDER — OXYCODONE HCL 5 MG PO TABS: 5.0000 mg | ORAL_TABLET | Freq: Once | ORAL | Status: DC | PRN

## 2014-09-23 MED ORDER — ARTIFICIAL TEARS OP OINT
TOPICAL_OINTMENT | OPHTHALMIC | Status: DC | PRN
  Administered 2014-09-23: 1 via OPHTHALMIC

## 2014-09-23 MED ORDER — SUCCINYLCHOLINE CHLORIDE 20 MG/ML IJ SOLN
INTRAMUSCULAR | Status: DC | PRN
  Administered 2014-09-23: 120 mg via INTRAVENOUS

## 2014-09-23 MED ORDER — ONDANSETRON HCL 4 MG/2ML IJ SOLN
INTRAMUSCULAR | Status: DC | PRN
  Administered 2014-09-23: 4 mg via INTRAVENOUS

## 2014-09-23 MED ORDER — NEOSTIGMINE METHYLSULFATE 10 MG/10ML IV SOLN
INTRAVENOUS | Status: DC | PRN
  Administered 2014-09-23: 4 mg via INTRAVENOUS

## 2014-09-23 MED ORDER — FENTANYL CITRATE 0.05 MG/ML IJ SOLN
INTRAMUSCULAR | Status: AC
  Filled 2014-09-23: qty 2

## 2014-09-23 MED ORDER — PHENYLEPHRINE HCL 10 MG/ML IJ SOLN
INTRAMUSCULAR | Status: DC | PRN
  Administered 2014-09-23 (×3): 80 ug via INTRAVENOUS

## 2014-09-23 MED ORDER — GLYCOPYRROLATE 0.2 MG/ML IJ SOLN
INTRAMUSCULAR | Status: AC
  Filled 2014-09-23: qty 4

## 2014-09-23 MED ORDER — LIDOCAINE HCL (CARDIAC) 20 MG/ML IV SOLN
INTRAVENOUS | Status: DC | PRN
  Administered 2014-09-23: 100 mg via INTRAVENOUS

## 2014-09-23 MED ORDER — ROCURONIUM BROMIDE 50 MG/5ML IV SOLN
INTRAVENOUS | Status: AC
  Filled 2014-09-23: qty 1

## 2014-09-23 MED ORDER — ROCURONIUM BROMIDE 100 MG/10ML IV SOLN
INTRAVENOUS | Status: DC | PRN
  Administered 2014-09-23: 10 mg via INTRAVENOUS
  Administered 2014-09-23: 30 mg via INTRAVENOUS

## 2014-09-23 MED ORDER — LIDOCAINE HCL 4 % MT SOLN
OROMUCOSAL | Status: DC | PRN
  Administered 2014-09-23: 5 mL via TOPICAL

## 2014-09-23 MED ORDER — FENTANYL CITRATE 0.05 MG/ML IJ SOLN: 25.0000 ug | INTRAMUSCULAR | Status: DC | PRN

## 2014-09-23 MED ORDER — 0.9 % SODIUM CHLORIDE (POUR BTL) OPTIME
TOPICAL | Status: DC | PRN
  Administered 2014-09-23: 1000 mL

## 2014-09-23 MED ORDER — FENTANYL CITRATE 0.05 MG/ML IJ SOLN
INTRAMUSCULAR | Status: AC
  Filled 2014-09-23: qty 5

## 2014-09-23 MED ORDER — OXYCODONE HCL 5 MG/5ML PO SOLN: 5.0000 mg | Freq: Once | ORAL | Status: DC | PRN

## 2014-09-23 SURGICAL SUPPLY — 40 items
BLADE 10 SAFETY STRL DISP (BLADE) IMPLANT
BRUSH CYTOL CELLEBRITY 1.5X140 (MISCELLANEOUS) ×3 IMPLANT
BRUSH SUPERTRAX BIOPSY (INSTRUMENTS) IMPLANT
BRUSH SUPERTRAX NDL-TIP CYTO (INSTRUMENTS) ×3 IMPLANT
CANISTER SUCTION 2500CC (MISCELLANEOUS) ×3 IMPLANT
CHANNEL WORK EXTEND EDGE 180 (KITS) IMPLANT
CHANNEL WORK EXTEND EDGE 45 (KITS) IMPLANT
CHANNEL WORK EXTEND EDGE 90 (KITS) IMPLANT
CONT SPEC 4OZ CLIKSEAL STRL BL (MISCELLANEOUS) ×9 IMPLANT
COVER TABLE BACK 60X90 (DRAPES) ×3 IMPLANT
FILTER STRAW FLUID ASPIR (MISCELLANEOUS) IMPLANT
FORCEPS BIOP RJ4 1.8 (CUTTING FORCEPS) IMPLANT
FORCEPS BIOP SUPERTRX PREMAR (INSTRUMENTS) ×3 IMPLANT
GAUZE SPONGE 4X4 12PLY STRL (GAUZE/BANDAGES/DRESSINGS) ×3 IMPLANT
GLOVE BIOGEL M STRL SZ7.5 (GLOVE) IMPLANT
GLOVE SURG SS PI 7.5 STRL IVOR (GLOVE) ×3 IMPLANT
GOWN STRL REUS W/ TWL LRG LVL3 (GOWN DISPOSABLE) ×1 IMPLANT
GOWN STRL REUS W/TWL LRG LVL3 (GOWN DISPOSABLE) ×2
KIT LOCATABLE GUIDE (CANNULA) IMPLANT
KIT MARKER FIDUCIAL DELIVERY (KITS) IMPLANT
KIT PROCEDURE EDGE 180 (KITS) IMPLANT
KIT PROCEDURE EDGE 45 (KITS) IMPLANT
KIT PROCEDURE EDGE 90 (KITS) ×3 IMPLANT
KIT ROOM TURNOVER OR (KITS) ×3 IMPLANT
MARKER SKIN DUAL TIP RULER LAB (MISCELLANEOUS) ×3 IMPLANT
NEEDLE BIOPSY TRANSBRONCH 21G (NEEDLE) IMPLANT
NEEDLE SUPERTRX PREMARK BIOPSY (NEEDLE) ×3 IMPLANT
NEEDLE SYS SONOTIP II EBUSTBNA (NEEDLE) ×3 IMPLANT
NS IRRIG 1000ML POUR BTL (IV SOLUTION) ×3 IMPLANT
OIL SILICONE PENTAX (PARTS (SERVICE/REPAIRS)) ×3 IMPLANT
PAD ARMBOARD 7.5X6 YLW CONV (MISCELLANEOUS) ×9 IMPLANT
PATCHES PATIENT (LABEL) ×3 IMPLANT
SYR 20CC LL (SYRINGE) ×3 IMPLANT
SYR 20ML ECCENTRIC (SYRINGE) ×6 IMPLANT
SYR 50ML SLIP (SYRINGE) ×3 IMPLANT
SYR 5ML LUER SLIP (SYRINGE) ×3 IMPLANT
TOWEL OR 17X24 6PK STRL BLUE (TOWEL DISPOSABLE) ×3 IMPLANT
TRAP SPECIMEN MUCOUS 40CC (MISCELLANEOUS) IMPLANT
TUBE CONNECTING 12'X1/4 (SUCTIONS) ×1
TUBE CONNECTING 12X1/4 (SUCTIONS) ×2 IMPLANT

## 2014-09-23 NOTE — Anesthesia Procedure Notes (Signed)
Procedure Name: Intubation Date/Time: 09/23/2014 10:41 AM Performed by: Ollen Bowl Pre-anesthesia Checklist: Patient identified, Emergency Drugs available, Suction available, Patient being monitored and Timeout performed Patient Re-evaluated:Patient Re-evaluated prior to inductionOxygen Delivery Method: Circle system utilized and Simple face mask Preoxygenation: Pre-oxygenation with 100% oxygen Intubation Type: IV induction Ventilation: Mask ventilation without difficulty Laryngoscope Size: Mac and 3 Grade View: Grade I Tube type: Oral Tube size: 8.5 mm Number of attempts: 1 Airway Equipment and Method: Patient positioned with wedge pillow,  Stylet and LTA kit utilized Placement Confirmation: ETT inserted through vocal cords under direct vision,  positive ETCO2 and breath sounds checked- equal and bilateral Secured at: 22 cm Tube secured with: Tape Dental Injury: Teeth and Oropharynx as per pre-operative assessment

## 2014-09-23 NOTE — Anesthesia Preprocedure Evaluation (Addendum)
Anesthesia Evaluation  Patient identified by MRN, date of birth, ID band Patient awake    Reviewed: Allergy & Precautions, H&P , NPO status , Patient's Chart, lab work & pertinent test results  Airway Mallampati: II  Neck ROM: full    Dental  (+) Teeth Intact, Dental Advisory Given   Pulmonary shortness of breath, Current Smoker,  Lung mass         Cardiovascular hypertension,     Neuro/Psych Anxiety Depression Bipolar Disorder    GI/Hepatic GERD-  ,  Endo/Other  diabetes, Type 2Morbid obesity  Renal/GU      Musculoskeletal  (+) Arthritis -,   Abdominal   Peds  Hematology   Anesthesia Other Findings   Reproductive/Obstetrics                          Anesthesia Physical Anesthesia Plan  ASA: III  Anesthesia Plan: General   Post-op Pain Management:    Induction: Intravenous  Airway Management Planned: Oral ETT  Additional Equipment:   Intra-op Plan:   Post-operative Plan: Extubation in OR  Informed Consent: I have reviewed the patients History and Physical, chart, labs and discussed the procedure including the risks, benefits and alternatives for the proposed anesthesia with the patient or authorized representative who has indicated his/her understanding and acceptance.     Plan Discussed with: CRNA, Anesthesiologist and Surgeon  Anesthesia Plan Comments:         Anesthesia Quick Evaluation

## 2014-09-23 NOTE — Telephone Encounter (Signed)
Spoke with Sabrina Adkins at pre admit and she states that [name] is not available but pt already had procedure done today.  Nothing further needed.

## 2014-09-23 NOTE — Discharge Instructions (Signed)
Flexible Bronchoscopy, Care After These instructions give you information on caring for yourself after your procedure. Your doctor may also give you more specific instructions. Call your doctor if you have any problems or questions after your procedure. HOME CARE  Do not eat or drink anything for 2 hours after your procedure. If you try to eat or drink before the medicine wears off, food or drink could go into your lungs. You could also burn yourself.  After 2 hours have passed and when you can cough and gag normally, you may eat soft food and drink liquids slowly.  The day after the test, you may eat your normal diet.  You may do your normal activities.  Keep all doctor visits. GET HELP RIGHT AWAY IF:  You get more and more short of breath.  You get light-headed.  You feel like you are going to pass out (faint).  You have chest pain.  You have new problems that worry you.  You cough up more than a little blood.  You cough up more blood than before. MAKE SURE YOU:  Understand these instructions.  Will watch your condition.  Will get help right away if you are not doing well or get worse. Document Released: 09/24/2009 Document Revised: 12/02/2013 Document Reviewed: 08/01/2013 [facility] Patient Information 2015 [place], [place]. This information is not intended to replace advice given to you by your health care provider. Make sure you discuss any questions you have with your health care provider.  Please call our office for any questions or concerns. [phone].

## 2014-09-23 NOTE — Interval H&P Note (Signed)
PCCM Interval Note.   Pt presents today for FOB, nodal and lung bx's. She has been stable, still has cough. She tells me that she is having new orthopnea when she lays down for bed. Otherwise no new issues.   Filed Vitals:   09/23/14 0931  BP: 139/96  Pulse: 105  Temp: 99.4 F (37.4 C)  Resp: 20   Gen: Pleasant obese woman, well-nourished, in no distress,  normal affect  ENT: No lesions,  mouth clear,  oropharynx clear, no postnasal drip  Neck: No JVD, no TMG, no carotid bruits  Lungs: No use of accessory muscles, clear without rales or rhonchi  Cardiovascular: RRR, heart sounds normal, no murmur or gallops, no peripheral edema  Abdomen: soft and NT, no HSM,  BS normal  Neuro: alert, non focal   Recent Labs Lab 09/22/14 1431  HGB 13.8  HCT 41.3  WBC 13.9*  PLT 240    Recent Labs Lab 09/22/14 1431  NA 135*  K 4.4  CL 95*  CO2 22  GLUCOSE 271*  BUN 15  CREATININE 0.76  CALCIUM 9.8     Plans:  She presents today for EBUS and ENB with bx's. I do not see any barriers to proceeding. She mentions some slight occasional orthopnea, but she does not look volume overloaded. Suspect she has OSA, which may be a factor when it is time to wake up from anesthesia. Will follow her closely post-op.  Will proceed with bx's this am   Baltazar Apo, MD, PhD 09/23/2014, 10:14 AM [facility] [phone] or if no answer [phone]

## 2014-09-23 NOTE — H&P (View-Only) (Signed)
Subjective:    Patient ID: Sabrina Adkins, female    DOB: 02/03/1961, 53 y.o.   MRN: [id number]  HPI 53 yo smoker (15 pk-yrs), hx HTN, DM. She was seen in the ED on 08/12/14 for neck pain. A CXR showed a RML nodule. CT scan confirmed this and 2 other GG nodules. She presents today to discuss bx.  She denies cough or dyspnea.   ROV 08/28/14 -- follow visit for pulmonary nodules. We had planned to perform navigational bronchoscopy and EBUS, but this was postponed because her PT and PTT were elevated. She has not had any medications that would cause this. Her LFTs are normal. She tells me that her mother has a clotting disorder, but she believes this was a hypercoag state.    Review of Systems  Constitutional: Negative for fever and unexpected weight change.  HENT: Negative for congestion, dental problem, ear pain, nosebleeds, postnasal drip, rhinorrhea, sinus pressure, sneezing, sore throat and trouble swallowing.   Eyes: Negative for redness and itching.  Respiratory: Negative for cough, chest tightness, shortness of breath and wheezing.   Cardiovascular: Negative for palpitations and leg swelling.  Gastrointestinal: Negative for nausea and vomiting.  Genitourinary: Negative for dysuria.  Musculoskeletal: Positive for back pain. Negative for joint swelling.  Skin: Negative for rash.  Neurological: Positive for headaches.  Hematological: Does not bruise/bleed easily.  Psychiatric/Behavioral: Negative for dysphoric mood. The patient is not nervous/anxious.       Objective:   Physical Exam  Filed Vitals:   08/28/14 1501  BP: 154/88  Pulse: 83  Height: 5\' 5"  (1.651 m)  Weight: 259 lb (117.482 kg)  SpO2: 98%   Gen: Pleasant, obese, in no distress,  normal affect  ENT: No lesions,  mouth clear,  oropharynx clear, no postnasal drip  Neck: No JVD, no TMG, no carotid bruits  Lungs: No use of accessory muscles, clear without rales or rhonchi  Cardiovascular: RRR, heart sounds normal,  no murmur or gallops, no peripheral edema  Musculoskeletal: No deformities, no cyanosis or clubbing  Neuro: alert, non focal  Skin: Warm, no lesions or rashes   CT 08/12/14 --  COMPARISON: None.  FINDINGS:  THORACIC INLET/BODY WALL:  No acute abnormality.  MEDIASTINUM:  Normal heart size. No pericardial effusion. There is atherosclerosis  of the aorta great vessels but no acute vascular findings. Proximal  LAD atherosclerotic calcification. Subcarinal lymphadenopathy  measuring 24 mm in diameter. Prevascular lymphadenopathy measuring  26 mm short axis. No visible supraclavicular adenopathy.  LUNG WINDOWS:  2.2 cm pulmonary nodule in the right middle lobe with lobulated and  spiculated margins. The mass has broad contact with the mildly  deformed minor fissure. More inferiorly within the right middle lobe  is ground-glass ill-defined nodule measuring 13 mm. There is 16 mm  ground-glass nodule in the apical right upper lobe2 mm nodule in the  superior segment right upper lobe, along the major fissure.  No acute findings such as pneumonia, edema, effusion, or  pneumothorax.  UPPER ABDOMEN:  2.7 cm low-density nodule in the right adrenal gland consistent with  adenoma. Two left adrenal nodules, also low-density, measuring up to  19 mm.  OSSEOUS:  No acute fracture. No suspicious lytic or blastic lesions.  IMPRESSION:  1. 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 The nodule has  broad contact with the minor fissure. There is also a 6 mm  ground-glass nodule also in the right middle lobe.  2.  16 mm ground-glass nodule in the right upper lobe, suspicious for  low-grade adenocarcinoma given #1.  3. 4 mm nodule along the upper right major fissure.  4. Atherosclerosis, including the coronary arteries.  5. Bilateral adrenal adenomas.        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 She has a family hx coagulopathy but not bleeding. 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 I don';t see any meds that would cause this.  - I believe she needs to see hematology for w/u before she is safe to have a procedure. Will schedule and perform bx's as soon as I get recs on how to do so safely, ? With FFP, etc.  - rov 1  Pulmonary nodule Her CT is suspicious for primary lung cancer. She needs navigational bronchoscopy and EBUS to stage her nodes. Schedule this soon as I get the recommendations from hematology regarding her elevated PT and PTT

## 2014-09-23 NOTE — Transfer of Care (Signed)
Immediate Anesthesia Transfer of Care Note  Patient: Sabrina Adkins  Procedure(s) Performed: Procedure(s): VIDEO BRONCHOSCOPY WITH ENDOBRONCHIAL ULTRASOUND (N/A) VIDEO BRONCHOSCOPY WITH ENDOBRONCHIAL NAVIGATION (N/A)  Patient Location: PACU  Anesthesia Type:General  Level of Consciousness: awake  Airway & Oxygen Therapy: Patient Spontanous Breathing and Patient connected to face mask oxygen  Post-op Assessment: Report given to PACU RN and Post -op Vital signs reviewed and stable  Post vital signs: Reviewed and stable  Complications: No apparent anesthesia complications

## 2014-09-23 NOTE — Op Note (Signed)
Video Bronchoscopy with Endobronchial Ultrasound and Electromagnetic Navigation Procedure Note  Date of Operation: 09/23/2014  Pre-op Diagnosis: mediastinal LAD and RML mass  Post-op Diagnosis: same  Surgeon: Baltazar Apo  Assistants: none  Anesthesia: General endotracheal anesthesia  Operation: Flexible video fiberoptic bronchoscopy with endobronchial ultrasound and biopsies.  Estimated Blood Loss: 60VP  Complications: none apparent  Indications and History: Sabrina Adkins is a 53 y.o. female with mediastinal LAD and RML nodule found on CT scan chest. Recommendation was made to pursue biopsies via bronchoscopy.  The risks, benefits, complications, treatment options and expected outcomes were discussed with the patient.  The possibilities of pneumothorax, pneumonia, reaction to medication, pulmonary aspiration, perforation of a viscus, bleeding, failure to diagnose a condition and creating a complication requiring transfusion or operation were discussed with the patient who freely signed the consent.    Description of Procedure: The patient was examined in the preoperative area and history and data from the preprocedure consultation were reviewed. It was deemed appropriate to proceed.  The patient was taken to OR10, identified as Sabrina Adkins and the procedure verified as Flexible Video Fiberoptic Bronchoscopy.  A Time Out was held and the above information confirmed. After being taken to the operating room general anesthesia was initiated and the patient  was orally intubated. The video fiberoptic bronchoscope was introduced via the endotracheal tube and a general inspection was performed which showed normal airways throughout. The standard scope was then withdrawn and the endobronchial ultrasound was used to identify and characterize the peritracheal, hilar and bronchial lymph nodes. Inspection showed slight enlargement of a 2R node and significant enlargement of the 7 node. Using  real-time ultrasound guidance Wang needle biopsies were take from Station  2R and 7 nodes and were sent for cytology. Attention was then turned to the RML lesion. Prior to the date of the procedure a high-resolution CT scan of the chest was performed. Utilizing [place] a virtual tracheobronchial tree was generated to allow the creation of distinct navigation pathway to the patient's RML abnormality.  The extendable working channel and locator guide were introduced into the bronchoscope. The distinct navigation pathways prepared prior to this procedure were then utilized to navigate to within 1cm of patient's lesion identified on CT scan. The extendable working channel was secured into place and the locator guide was withdrawn. Under fluoroscopic guidance transbronchial needle brushings, transbronchial Wang needle biopsies, and transbronchial forceps biopsies were performed to be sent for cytology and pathology. A bronchioalveolar lavage was performed in the RML and sent for cytology and microbiology (bacterial, fungal, AFB smears and cultures). At the end of the procedure a general airway inspection was performed and there was no evidence of active bleeding. The bronchoscope was removed.  The patient tolerated the procedure well. There was no significant blood loss and there were no obvious complications. A post-procedural chest x-ray is pending.  Samples: 1. Transbronchial needle brushings from RML  2. Transbronchial Wang needle biopsies from RML 3. Transbronchial forceps biopsies from RML 4. Bronchoalveolar lavage from RML 5. Wang needle biopsies from 2R node 6. Wang needle biopsies from 7 node   Plans:  The patient will be discharged from the PACU to home when recovered from anesthesia. We will review the cytology, pathology and microbiology results with the patient when they become available. Outpatient followup will be with Dr Lamonte Sakai.   Baltazar Apo, MD, PhD 09/23/2014, 12:13  PM [facility] [phone] or if no answer [phone]

## 2014-09-23 NOTE — Anesthesia Postprocedure Evaluation (Signed)
Anesthesia Post Note  Patient: Sabrina Adkins  Procedure(s) Performed: Procedure(s) (LRB): VIDEO BRONCHOSCOPY WITH ENDOBRONCHIAL ULTRASOUND (N/A) VIDEO BRONCHOSCOPY WITH ENDOBRONCHIAL NAVIGATION (N/A)  Anesthesia type: General  Patient location: PACU  Post pain: Pain level controlled and Adequate analgesia  Post assessment: Post-op Vital signs reviewed, Patient's Cardiovascular Status Stable, Respiratory Function Stable, Patent Airway and Pain level controlled  Last Vitals:  Filed Vitals:   09/23/14 1234  BP: 112/66  Pulse:   Temp:   Resp: 25    Post vital signs: Reviewed and stable  Level of consciousness: awake, alert  and oriented  Complications: No apparent anesthesia complications

## 2014-09-25 LAB — CULTURE, BAL-QUANTITATIVE: CULTURE: NO GROWTH

## 2014-09-25 LAB — CULTURE, BAL-QUANTITATIVE W GRAM STAIN: Colony Count: NO GROWTH

## 2014-09-26 ENCOUNTER — Encounter (HOSPITAL_COMMUNITY): Payer: Self-pay | Admitting: Emergency Medicine

## 2014-09-28 ENCOUNTER — Telehealth: Payer: Self-pay | Admitting: Emergency Medicine

## 2014-09-28 NOTE — Telephone Encounter (Signed)
Called spoke with pt. She is requesting her Biopsy results. Please advise RB thanks

## 2014-09-30 NOTE — Telephone Encounter (Signed)
I spoke with the patient and reviewed the tissue dx, squamous cell CA. She was understandably upset. She wants to talk to her family about possibly seeing oncology closer to home in [place] vs getting care in [place]. I have an appt w her next week. She will call sooner if she decides to let me refer her locally before that appt.

## 2014-09-30 NOTE — Telephone Encounter (Signed)
Dr. Lamonte Sakai please advise if you are ok with calling these family members.  Thank you.

## 2014-09-30 NOTE — Telephone Encounter (Signed)
Pt calling back very upset a/b her results very understandable, she was leaving a list of names of family member that she wanted dr.byrum to speak with which were: Assunta Curtis [phone] Joeseph Amor [phone] and Johney Frame [phone], don't know if he can do a conference call with them or not, pt said dr byrum would talk to anybody she requested.Hillery Hunter

## 2014-10-02 NOTE — Telephone Encounter (Signed)
I called all 3 numbers, no answer, left messages on each voice mail. We can try to set up some kind of conference call next week if they would like. Please call one (or several) of them back to see if they can do this.

## 2014-10-05 NOTE — Telephone Encounter (Signed)
[name] returned call & can be reached at [phone].  Sabrina Adkins

## 2014-10-05 NOTE — Telephone Encounter (Signed)
LMTCB for Sabrina Adkins and for [name].  Called and spoke to [employer], pt's daughter. Advised her that RB is requesting to speak with her per the pt's request. Inda Merlin stated she is available to speak with RB after 5pm at the number provided, [phone].   Pt has pending appt on 10/07/14.  Will forward to [place].

## 2014-10-06 NOTE — Telephone Encounter (Signed)
Spoke with pt and instructed to keep appt with Dr Lamonte Sakai 10/07/14 at 4:30.   Will discuss results further at this time.  And she can use speaker phone to let family listen to visit if she desires .  Pt verbalized understanding.

## 2014-10-06 NOTE — Telephone Encounter (Signed)
Spoke with Inda Merlin and [name] and informed them that pt has an appt with Dr Lamonte Sakai tomorrow 10/07/14 at 4:30 and pt can use speaker phone at ov if she wants for Dr Lamonte Sakai to discuss this with her family.  They verbalized understanding. LMOMTCB for pt  - She needs to keep appt with Dr Lamonte Sakai and explain that we can use cellphone speaker phone to communicate with her family if she wants to.

## 2014-10-06 NOTE — Telephone Encounter (Signed)
Pt daughter returned call.Hillery Hunter

## 2014-10-07 ENCOUNTER — Ambulatory Visit (INDEPENDENT_AMBULATORY_CARE_PROVIDER_SITE_OTHER): Payer: Medicaid Other | Admitting: Emergency Medicine

## 2014-10-07 ENCOUNTER — Encounter: Payer: Self-pay | Admitting: Emergency Medicine

## 2014-10-07 VITALS — BP 138/90 | HR 102 | Ht 63.5 in | Wt 258.0 lb

## 2014-10-07 DIAGNOSIS — L0293 Carbuncle, unspecified: Secondary | ICD-10-CM | POA: Insufficient documentation

## 2014-10-07 DIAGNOSIS — C3491 Malignant neoplasm of unspecified part of right bronchus or lung: Secondary | ICD-10-CM

## 2014-10-07 MED ORDER — DICLOXACILLIN SODIUM 500 MG PO CAPS: ORAL_CAPSULE | ORAL | Status: DC

## 2014-10-07 NOTE — Assessment & Plan Note (Addendum)
She remains upset, but is dealing w the dx a bit better now. I answered all of her kids' questions. I have recommended that she work on the staging testing here, go to see Dr Julien Nordmann for an initial eval in preparation for transferring her care to [place].  She is agreeable to this. I will order PET, MRI Brain and have her go to [place] in prep for her move to be home w her kids.

## 2014-10-07 NOTE — Assessment & Plan Note (Signed)
Multiple carbuncles noted.  - will start her on dicloxacillin and refer her to sgy to have them drained.

## 2014-10-07 NOTE — Patient Instructions (Signed)
We will perform an MRI of the brain We will perform a PET scan We will refer you to the Thoracic Oncology Clinic in [place] as you prepare to move to [facility] for treatment We will refer you to see general surgery in [facility] regarding the cystic lesions Take dicloxacillin 500mg  every 6 hours for 10 days.  Follow with Dr Lamonte Sakai in 1 month if you are still living in [place].

## 2014-10-07 NOTE — Progress Notes (Signed)
Subjective:    Patient ID: Sabrina Adkins, female    DOB: 04-Dec-1961, 53 y.o.   MRN: [id number]  HPI 53 yo smoker (15 pk-yrs), hx HTN, DM. She was seen in the ED on 08/12/14 for neck pain. A CXR showed a RML nodule. CT scan confirmed this and 2 other GG nodules. She presents today to discuss bx.  She denies cough or dyspnea.   ROV 08/28/14 -- follow visit for pulmonary nodules. We had planned to perform navigational bronchoscopy and EBUS, but this was postponed because her PT and PTT were elevated. She has not had any medications that would cause this. Her LFTs are normal. She tells me that her mother has a clotting disorder, but she believes this was a hypercoag state.   ROV 10/07/14 -- follow up visit s/p ENB on 10/14 > path showed NSCLCA, most consistent w squamous cell. She did not want to be referred yet, is considering getting rx in [place]. This visit was arranged for me to speak to her kids on conference call about the dx, plans for staging, therapy. They want her to get her rx in [place]. She also mentions multiple evolving painful cystic lesions - on her breast, her R shoulder, her abd that are consistent with abscesses vs furuncles   Review of Systems  Constitutional: Negative for fever and unexpected weight change.  HENT: Negative for congestion, dental problem, ear pain, nosebleeds, postnasal drip, rhinorrhea, sinus pressure, sneezing, sore throat and trouble swallowing.   Eyes: Negative for redness and itching.  Respiratory: Negative for cough, chest tightness, shortness of breath and wheezing.   Cardiovascular: Negative for palpitations and leg swelling.  Gastrointestinal: Negative for nausea and vomiting.  Genitourinary: Negative for dysuria.  Musculoskeletal: Positive for back pain. Negative for joint swelling.  Skin: Negative for rash.  Neurological: Positive for headaches.  Hematological: Does not bruise/bleed easily.  Psychiatric/Behavioral: Negative for dysphoric  mood. The patient is not nervous/anxious.       Objective:   Physical Exam  Filed Vitals:   10/07/14 1708  BP: 138/90  Pulse: 102  Height: 5' 3.5" (1.613 m)  Weight: 258 lb (117.028 kg)  SpO2: 98%   Gen: Pleasant, obese, in no distress, tearful  ENT: No lesions,  mouth clear,  oropharynx clear, no postnasal drip  Neck: No JVD, no TMG, no carotid bruits  Lungs: No use of accessory muscles, clear without rales or rhonchi  Cardiovascular: RRR, heart sounds normal, no murmur or gallops, no peripheral edema  Musculoskeletal: No deformities, no cyanosis or clubbing  Neuro: alert, non focal  Skin: tender raised lesions appear to be carbuncles without cellulitis. Present on her R breast, R shoulder, abdomen.    CT 08/12/14 --  COMPARISON: None.  FINDINGS:  THORACIC INLET/BODY WALL:  No acute abnormality.  MEDIASTINUM:  Normal heart size. No pericardial effusion. There is atherosclerosis  of the aorta great vessels but no acute vascular findings. Proximal  LAD atherosclerotic calcification. Subcarinal lymphadenopathy  measuring 24 mm in diameter. Prevascular lymphadenopathy measuring  26 mm short axis. No visible supraclavicular adenopathy.  LUNG WINDOWS:  2.2 cm pulmonary nodule in the right middle lobe with lobulated and  spiculated margins. The mass has broad contact with the mildly  deformed minor fissure. More inferiorly within the right middle lobe  is ground-glass ill-defined nodule measuring 13 mm. There is 16 mm  ground-glass nodule in the apical right upper lobe2 mm nodule in the  superior segment right upper lobe, along the major  fissure.  No acute findings such as pneumonia, edema, effusion, or  pneumothorax.  UPPER ABDOMEN:  2.7 cm low-density nodule in the right adrenal gland consistent with  adenoma. Two left adrenal nodules, also low-density, measuring up to  19 mm.  OSSEOUS:  No acute fracture. No suspicious lytic or blastic lesions.  IMPRESSION:  1.  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 The nodule has  broad contact with the minor fissure. There is also a 6 mm  ground-glass nodule also in the right middle lobe.  2. 16 mm ground-glass nodule in the right upper lobe, suspicious for  low-grade adenocarcinoma given #1.  3. 4 mm nodule along the upper right major fissure.  4. Atherosclerosis, including the coronary arteries.  5. Bilateral adrenal adenomas.        Assessment & Plan:  Squamous cell lung cancer She remains upset, but is dealing w the dx a bit better now. I answered all of her kids' questions. I have recommended that she work on the staging testing here, go to see Dr Julien Nordmann for an initial eval in preparation for transferring her care to [place].  She is agreeable to this. I will order PET, MRI Brain and have her go to [place] in prep for her move to be home w her kids.   Carbuncle Multiple carbuncles noted.  - will start her on dicloxacillin and refer her to sgy to have them drained.

## 2014-10-08 ENCOUNTER — Telehealth: Payer: Self-pay | Admitting: Emergency Medicine

## 2014-10-08 MED ORDER — ALPRAZOLAM 0.5 MG PO TABS: ORAL_TABLET | ORAL | Status: DC

## 2014-10-08 NOTE — Telephone Encounter (Signed)
Called pt.  1) Pt is having MR brain and PET scan done. she wants something called in since she is claustrophobic. 2) Since she is allergic to iodine, she was told RB needs to pre treat her so she does not break out in hives d/t the dye. 3) she no longer uses [facility] and needs the dicloxacillin called into [facility]. I called [facility] and wants to confirm RB is aware pt is allergic to PCN and if it is okay to dispense this medication?  Please advise RB thanks

## 2014-10-08 NOTE — Telephone Encounter (Signed)
lmomtcb x1 

## 2014-10-08 NOTE — Telephone Encounter (Signed)
Pt returned call.  Sabrina Adkins ° °

## 2014-10-08 NOTE — Telephone Encounter (Signed)
[facility] needs to know if patient is allergic to penicillin   [phone]

## 2014-10-08 NOTE — Telephone Encounter (Signed)
Sent Xanax to [facility].  Suanne Marker will call [place] from [facility] tomorrow to ask about PET scan.  If has Iodine, will send Prednisone 50mg  PO 13 hours, 7 hours, 1 hour prior to study and Benadryl 50mg  1 hour prior to study per protocol.  Ok to send per Dr. Lamonte Sakai if PET has iodine.

## 2014-10-09 ENCOUNTER — Telehealth: Payer: Self-pay | Admitting: *Deleted

## 2014-10-09 ENCOUNTER — Other Ambulatory Visit: Payer: Self-pay | Admitting: *Deleted

## 2014-10-09 DIAGNOSIS — C3491 Malignant neoplasm of unspecified part of right bronchus or lung: Secondary | ICD-10-CM

## 2014-10-09 NOTE — Telephone Encounter (Signed)
Pharmacy called back since they have not heard from [place] regarding allergy to PCN. RB is off this afternoon according to schedule. Please advise TP thanks  Allergies  Allergen Reactions  . Aspirin Hives, Shortness Of Breath, Itching and Rash    Okay to tolerate coated aspirin-MUST BE E.C.  . Ibuprofen Hives, Shortness Of Breath, Itching and Rash  . Penicillins Anaphylaxis and Hives    THROAT CLOSES  . Orange Juice [Orange Oil]     hives  . Iodine Hives, Itching and Rash  . Latex Hives, Itching and Rash

## 2014-10-09 NOTE — Addendum Note (Signed)
Addended by: Mathis Dad on: 10/09/2014 12:03 PM   Modules accepted: Orders

## 2014-10-09 NOTE — Telephone Encounter (Signed)
Per Dr. Lamonte Sakai, cancel order for abx and await appointment with general surgeon.  Advised patient and she was upset because she says the boils are really aggravating her and she doesn't think she will get the referral quick enough.  She wants to know if there is something else Dr. Lamonte Sakai can prescribe.  Patient says that she has been having trouble eating, she has been throwing up her food.  Wants to know what can be done.  Patient is aware that Dr. Lamonte Sakai is not in the office until Monday and says she can wait for his response on Monday.

## 2014-10-09 NOTE — Telephone Encounter (Signed)
Patient notified that scans do not have Iodine in them.  Pharmacy needs to know an alternative medication the Diclocyllin is a PCN and patient has severe reaction to PCN's.  Does Dr. Lamonte Sakai still want patient to have this medication?  If not, needs alternative.

## 2014-10-09 NOTE — Telephone Encounter (Signed)
Called patient with appt to see Dr. Julien Nordmann at 11:00 on 10/21/14.  She verbalized understanding of appt time and place.

## 2014-10-11 ENCOUNTER — Emergency Department (HOSPITAL_COMMUNITY): Payer: Medicaid Other

## 2014-10-11 ENCOUNTER — Inpatient Hospital Stay (HOSPITAL_COMMUNITY)
Admission: EM | Admit: 2014-10-11 | Discharge: 2014-10-26 | DRG: 180 | Disposition: A | Discharge status: Home / Self Care | Payer: Medicaid Other | Attending: Internal Medicine | Admitting: Internal Medicine

## 2014-10-11 ENCOUNTER — Inpatient Hospital Stay (HOSPITAL_COMMUNITY): Payer: Medicaid Other

## 2014-10-11 ENCOUNTER — Encounter (HOSPITAL_COMMUNITY): Payer: Self-pay | Admitting: Family Medicine

## 2014-10-11 DIAGNOSIS — Z8249 Family history of ischemic heart disease and other diseases of the circulatory system: Secondary | ICD-10-CM

## 2014-10-11 DIAGNOSIS — Z91040 Latex allergy status: Secondary | ICD-10-CM | POA: Diagnosis not present

## 2014-10-11 DIAGNOSIS — A599 Trichomoniasis, unspecified: Secondary | ICD-10-CM | POA: Diagnosis present

## 2014-10-11 DIAGNOSIS — F1290 Cannabis use, unspecified, uncomplicated: Secondary | ICD-10-CM | POA: Diagnosis present

## 2014-10-11 DIAGNOSIS — Z886 Allergy status to analgesic agent status: Secondary | ICD-10-CM | POA: Diagnosis not present

## 2014-10-11 DIAGNOSIS — K5900 Constipation, unspecified: Secondary | ICD-10-CM | POA: Diagnosis not present

## 2014-10-11 DIAGNOSIS — E118 Type 2 diabetes mellitus with unspecified complications: Secondary | ICD-10-CM | POA: Insufficient documentation

## 2014-10-11 DIAGNOSIS — I959 Hypotension, unspecified: Secondary | ICD-10-CM | POA: Diagnosis present

## 2014-10-11 DIAGNOSIS — R079 Chest pain, unspecified: Secondary | ICD-10-CM

## 2014-10-11 DIAGNOSIS — R0602 Shortness of breath: Secondary | ICD-10-CM

## 2014-10-11 DIAGNOSIS — I10 Essential (primary) hypertension: Secondary | ICD-10-CM

## 2014-10-11 DIAGNOSIS — Z87440 Personal history of urinary (tract) infections: Secondary | ICD-10-CM

## 2014-10-11 DIAGNOSIS — C7800 Secondary malignant neoplasm of unspecified lung: Secondary | ICD-10-CM | POA: Diagnosis present

## 2014-10-11 DIAGNOSIS — C773 Secondary and unspecified malignant neoplasm of axilla and upper limb lymph nodes: Secondary | ICD-10-CM | POA: Diagnosis present

## 2014-10-11 DIAGNOSIS — Z720 Tobacco use: Secondary | ICD-10-CM

## 2014-10-11 DIAGNOSIS — I472 Ventricular tachycardia, unspecified: Secondary | ICD-10-CM

## 2014-10-11 DIAGNOSIS — M1990 Unspecified osteoarthritis, unspecified site: Secondary | ICD-10-CM | POA: Diagnosis present

## 2014-10-11 DIAGNOSIS — Z794 Long term (current) use of insulin: Secondary | ICD-10-CM | POA: Diagnosis not present

## 2014-10-11 DIAGNOSIS — Z6841 Body Mass Index (BMI) 40.0 and over, adult: Secondary | ICD-10-CM | POA: Diagnosis not present

## 2014-10-11 DIAGNOSIS — K219 Gastro-esophageal reflux disease without esophagitis: Secondary | ICD-10-CM | POA: Diagnosis present

## 2014-10-11 DIAGNOSIS — D696 Thrombocytopenia, unspecified: Secondary | ICD-10-CM | POA: Diagnosis not present

## 2014-10-11 DIAGNOSIS — E119 Type 2 diabetes mellitus without complications: Secondary | ICD-10-CM

## 2014-10-11 DIAGNOSIS — I471 Supraventricular tachycardia: Secondary | ICD-10-CM | POA: Diagnosis present

## 2014-10-11 DIAGNOSIS — C7989 Secondary malignant neoplasm of other specified sites: Secondary | ICD-10-CM | POA: Diagnosis present

## 2014-10-11 DIAGNOSIS — Z888 Allergy status to other drugs, medicaments and biological substances status: Secondary | ICD-10-CM | POA: Diagnosis not present

## 2014-10-11 DIAGNOSIS — Z803 Family history of malignant neoplasm of breast: Secondary | ICD-10-CM

## 2014-10-11 DIAGNOSIS — Z86718 Personal history of other venous thrombosis and embolism: Secondary | ICD-10-CM

## 2014-10-11 DIAGNOSIS — C7931 Secondary malignant neoplasm of brain: Secondary | ICD-10-CM | POA: Diagnosis present

## 2014-10-11 DIAGNOSIS — N390 Urinary tract infection, site not specified: Secondary | ICD-10-CM | POA: Diagnosis present

## 2014-10-11 DIAGNOSIS — F319 Bipolar disorder, unspecified: Secondary | ICD-10-CM | POA: Diagnosis present

## 2014-10-11 DIAGNOSIS — R0600 Dyspnea, unspecified: Secondary | ICD-10-CM

## 2014-10-11 DIAGNOSIS — F419 Anxiety disorder, unspecified: Secondary | ICD-10-CM | POA: Diagnosis present

## 2014-10-11 DIAGNOSIS — C7971 Secondary malignant neoplasm of right adrenal gland: Secondary | ICD-10-CM | POA: Diagnosis present

## 2014-10-11 DIAGNOSIS — J9602 Acute respiratory failure with hypercapnia: Secondary | ICD-10-CM | POA: Diagnosis present

## 2014-10-11 DIAGNOSIS — Z801 Family history of malignant neoplasm of trachea, bronchus and lung: Secondary | ICD-10-CM

## 2014-10-11 DIAGNOSIS — C799 Secondary malignant neoplasm of unspecified site: Secondary | ICD-10-CM | POA: Insufficient documentation

## 2014-10-11 DIAGNOSIS — E1165 Type 2 diabetes mellitus with hyperglycemia: Secondary | ICD-10-CM | POA: Diagnosis present

## 2014-10-11 DIAGNOSIS — Z806 Family history of leukemia: Secondary | ICD-10-CM | POA: Diagnosis not present

## 2014-10-11 DIAGNOSIS — F17210 Nicotine dependence, cigarettes, uncomplicated: Secondary | ICD-10-CM | POA: Diagnosis present

## 2014-10-11 DIAGNOSIS — T451X5A Adverse effect of antineoplastic and immunosuppressive drugs, initial encounter: Secondary | ICD-10-CM | POA: Diagnosis not present

## 2014-10-11 DIAGNOSIS — R600 Localized edema: Secondary | ICD-10-CM | POA: Diagnosis present

## 2014-10-11 DIAGNOSIS — R591 Generalized enlarged lymph nodes: Secondary | ICD-10-CM | POA: Diagnosis present

## 2014-10-11 DIAGNOSIS — Z8261 Family history of arthritis: Secondary | ICD-10-CM | POA: Diagnosis not present

## 2014-10-11 DIAGNOSIS — C342 Malignant neoplasm of middle lobe, bronchus or lung: Secondary | ICD-10-CM | POA: Diagnosis present

## 2014-10-11 DIAGNOSIS — E669 Obesity, unspecified: Secondary | ICD-10-CM | POA: Diagnosis present

## 2014-10-11 DIAGNOSIS — J189 Pneumonia, unspecified organism: Secondary | ICD-10-CM | POA: Diagnosis present

## 2014-10-11 DIAGNOSIS — I313 Pericardial effusion (noninflammatory): Secondary | ICD-10-CM

## 2014-10-11 DIAGNOSIS — C771 Secondary and unspecified malignant neoplasm of intrathoracic lymph nodes: Secondary | ICD-10-CM | POA: Diagnosis present

## 2014-10-11 DIAGNOSIS — R002 Palpitations: Secondary | ICD-10-CM | POA: Diagnosis present

## 2014-10-11 DIAGNOSIS — C3491 Malignant neoplasm of unspecified part of right bronchus or lung: Secondary | ICD-10-CM

## 2014-10-11 DIAGNOSIS — R112 Nausea with vomiting, unspecified: Secondary | ICD-10-CM | POA: Diagnosis not present

## 2014-10-11 DIAGNOSIS — C7972 Secondary malignant neoplasm of left adrenal gland: Secondary | ICD-10-CM | POA: Diagnosis present

## 2014-10-11 DIAGNOSIS — Z880 Allergy status to penicillin: Secondary | ICD-10-CM

## 2014-10-11 DIAGNOSIS — Z79899 Other long term (current) drug therapy: Secondary | ICD-10-CM

## 2014-10-11 DIAGNOSIS — I480 Paroxysmal atrial fibrillation: Secondary | ICD-10-CM | POA: Diagnosis present

## 2014-10-11 DIAGNOSIS — E871 Hypo-osmolality and hyponatremia: Secondary | ICD-10-CM | POA: Diagnosis not present

## 2014-10-11 DIAGNOSIS — J9690 Respiratory failure, unspecified, unspecified whether with hypoxia or hypercapnia: Secondary | ICD-10-CM | POA: Insufficient documentation

## 2014-10-11 DIAGNOSIS — C3490 Malignant neoplasm of unspecified part of unspecified bronchus or lung: Secondary | ICD-10-CM

## 2014-10-11 DIAGNOSIS — D689 Coagulation defect, unspecified: Secondary | ICD-10-CM

## 2014-10-11 DIAGNOSIS — D72829 Elevated white blood cell count, unspecified: Secondary | ICD-10-CM | POA: Insufficient documentation

## 2014-10-11 HISTORY — DX: Malignant (primary) neoplasm, unspecified: C80.1

## 2014-10-11 HISTORY — DX: Hypotension, unspecified: I95.9

## 2014-10-11 LAB — COMPREHENSIVE METABOLIC PANEL
ALK PHOS: 67 U/L (ref 39–117)
ALT: 17 U/L (ref 0–35)
AST: 17 U/L (ref 0–37)
Albumin: 3.6 g/dL (ref 3.5–5.2)
Anion gap: 13 (ref 5–15)
BUN: 12 mg/dL (ref 6–23)
CALCIUM: 10.2 mg/dL (ref 8.4–10.5)
CO2: 29 meq/L (ref 19–32)
Chloride: 90 mEq/L — ABNORMAL LOW (ref 96–112)
Creatinine, Ser: 0.64 mg/dL (ref 0.50–1.10)
GLUCOSE: 349 mg/dL — AB (ref 70–99)
POTASSIUM: 3.9 meq/L (ref 3.7–5.3)
SODIUM: 132 meq/L — AB (ref 137–147)
Total Bilirubin: 0.6 mg/dL (ref 0.3–1.2)
Total Protein: 7.2 g/dL (ref 6.0–8.3)

## 2014-10-11 LAB — URINE MICROSCOPIC-ADD ON

## 2014-10-11 LAB — I-STAT VENOUS BLOOD GAS, ED
ACID-BASE EXCESS: 9 mmol/L — AB (ref 0.0–2.0)
Bicarbonate: 34.8 mEq/L — ABNORMAL HIGH (ref 20.0–24.0)
O2 SAT: 87 %
PO2 VEN: 51 mmHg — AB (ref 30.0–45.0)
TCO2: 36 mmol/L (ref 0–100)
pCO2, Ven: 49.3 mmHg (ref 45.0–50.0)
pH, Ven: 7.456 — ABNORMAL HIGH (ref 7.250–7.300)

## 2014-10-11 LAB — CBC WITH DIFFERENTIAL/PLATELET
Basophils Absolute: 0 10*3/uL (ref 0.0–0.1)
Basophils Relative: 0 % (ref 0–1)
Eosinophils Absolute: 0 10*3/uL (ref 0.0–0.7)
Eosinophils Relative: 0 % (ref 0–5)
HCT: 42.8 % (ref 36.0–46.0)
HEMOGLOBIN: 14.4 g/dL (ref 12.0–15.0)
Lymphocytes Relative: 16 % (ref 12–46)
Lymphs Abs: 3.1 10*3/uL (ref 0.7–4.0)
MCH: 23.9 pg — ABNORMAL LOW (ref 26.0–34.0)
MCHC: 33.6 g/dL (ref 30.0–36.0)
MCV: 71.1 fL — ABNORMAL LOW (ref 78.0–100.0)
MONO ABS: 1.2 10*3/uL — AB (ref 0.1–1.0)
Monocytes Relative: 6 % (ref 3–12)
NEUTROS ABS: 15.2 10*3/uL — AB (ref 1.7–7.7)
NEUTROS PCT: 78 % — AB (ref 43–77)
Platelets: 218 10*3/uL (ref 150–400)
RBC: 6.02 MIL/uL — AB (ref 3.87–5.11)
RDW: 13.7 % (ref 11.5–15.5)
WBC: 19.5 10*3/uL — ABNORMAL HIGH (ref 4.0–10.5)

## 2014-10-11 LAB — URINALYSIS, ROUTINE W REFLEX MICROSCOPIC
Bilirubin Urine: NEGATIVE
Glucose, UA: >1000 mg/dL — AB
Ketones, ur: 15 mg/dL — AB
NITRITE: NEGATIVE
Protein, ur: NEGATIVE mg/dL
SPECIFIC GRAVITY, URINE: 1.01 (ref 1.005–1.030)
UROBILINOGEN UA: 0.2 mg/dL (ref 0.0–1.0)
pH: 6 (ref 5.0–8.0)

## 2014-10-11 LAB — TSH: TSH: 1.29 u[IU]/mL (ref 0.350–4.500)

## 2014-10-11 LAB — I-STAT CG4 LACTIC ACID, ED: Lactic Acid, Venous: 2.33 mmol/L — ABNORMAL HIGH (ref 0.5–2.2)

## 2014-10-11 LAB — I-STAT TROPONIN, ED: TROPONIN I, POC: 0.03 ng/mL (ref 0.00–0.08)

## 2014-10-11 LAB — GLUCOSE, CAPILLARY: Glucose-Capillary: 390 mg/dL — ABNORMAL HIGH (ref 70–99)

## 2014-10-11 MED ORDER — ALBUTEROL SULFATE (2.5 MG/3ML) 0.083% IN NEBU: 2.5000 mg | INHALATION_SOLUTION | RESPIRATORY_TRACT | Status: DC | PRN

## 2014-10-11 MED ORDER — DILTIAZEM HCL 25 MG/5ML IV SOLN
10.0000 mg | Freq: Once | INTRAVENOUS | Status: AC
  Administered 2014-10-11: 10 mg via INTRAVENOUS

## 2014-10-11 MED ORDER — ONDANSETRON HCL 4 MG PO TABS
4.0000 mg | ORAL_TABLET | Freq: Four times a day (QID) | ORAL | Status: DC | PRN
Start: 2014-10-11 — End: 2014-10-26
  Administered 2014-10-13: 4 mg via ORAL
  Filled 2014-10-11: qty 1

## 2014-10-11 MED ORDER — GADOBENATE DIMEGLUMINE 529 MG/ML IV SOLN
20.0000 mL | Freq: Once | INTRAVENOUS | Status: AC | PRN
  Administered 2014-10-11: 20 mL via INTRAVENOUS

## 2014-10-11 MED ORDER — DILTIAZEM HCL ER COATED BEADS 180 MG PO CP24
180.0000 mg | ORAL_CAPSULE | Freq: Every day | ORAL | Status: DC
  Administered 2014-10-11: 180 mg via ORAL
  Filled 2014-10-11: qty 1

## 2014-10-11 MED ORDER — MAGNESIUM CITRATE PO SOLN: 1.0000 | Freq: Once | ORAL | Status: AC | PRN

## 2014-10-11 MED ORDER — ALBUTEROL SULFATE (2.5 MG/3ML) 0.083% IN NEBU
2.5000 mg | INHALATION_SOLUTION | Freq: Four times a day (QID) | RESPIRATORY_TRACT | Status: DC
Start: 2014-10-11 — End: 2014-10-12
  Administered 2014-10-11 – 2014-10-12 (×2): 2.5 mg via RESPIRATORY_TRACT
  Filled 2014-10-11 (×2): qty 3

## 2014-10-11 MED ORDER — HYDROMORPHONE HCL 1 MG/ML IJ SOLN
1.0000 mg | INTRAMUSCULAR | Status: AC | PRN
  Administered 2014-10-11 – 2014-10-12 (×2): 1 mg via INTRAVENOUS
  Filled 2014-10-11 (×2): qty 1

## 2014-10-11 MED ORDER — AMLODIPINE BESYLATE 5 MG PO TABS: 5.0000 mg | ORAL_TABLET | Freq: Every day | ORAL | Status: DC

## 2014-10-11 MED ORDER — INSULIN ASPART 100 UNIT/ML ~~LOC~~ SOLN
0.0000 [IU] | Freq: Three times a day (TID) | SUBCUTANEOUS | Status: DC
  Administered 2014-10-12: 11 [IU] via SUBCUTANEOUS
  Administered 2014-10-12: 8 [IU] via SUBCUTANEOUS
  Administered 2014-10-12: 15 [IU] via SUBCUTANEOUS
  Administered 2014-10-13: 8 [IU] via SUBCUTANEOUS
  Administered 2014-10-13 (×2): 11 [IU] via SUBCUTANEOUS
  Administered 2014-10-14: 8 [IU] via SUBCUTANEOUS
  Administered 2014-10-14 – 2014-10-15 (×4): 11 [IU] via SUBCUTANEOUS

## 2014-10-11 MED ORDER — ALUM & MAG HYDROXIDE-SIMETH 200-200-20 MG/5ML PO SUSP: 30.0000 mL | Freq: Four times a day (QID) | ORAL | Status: DC | PRN

## 2014-10-11 MED ORDER — ONDANSETRON HCL 4 MG/2ML IJ SOLN
4.0000 mg | Freq: Once | INTRAMUSCULAR | Status: AC
  Administered 2014-10-11: 4 mg via INTRAVENOUS
  Filled 2014-10-11: qty 2

## 2014-10-11 MED ORDER — DILTIAZEM HCL 25 MG/5ML IV SOLN
10.0000 mg | Freq: Once | INTRAVENOUS | Status: AC
  Administered 2014-10-11: 10 mg via INTRAVENOUS
  Filled 2014-10-11: qty 5

## 2014-10-11 MED ORDER — ADULT MULTIVITAMIN W/MINERALS CH
1.0000 | ORAL_TABLET | Freq: Every day | ORAL | Status: DC
  Administered 2014-10-11 – 2014-10-25 (×14): 1 via ORAL
  Filled 2014-10-11 (×16): qty 1

## 2014-10-11 MED ORDER — SENNA 8.6 MG PO TABS
1.0000 | ORAL_TABLET | Freq: Two times a day (BID) | ORAL | Status: DC
  Administered 2014-10-11 – 2014-10-26 (×29): 8.6 mg via ORAL
  Filled 2014-10-11 (×30): qty 1

## 2014-10-11 MED ORDER — IOHEXOL 350 MG/ML SOLN
80.0000 mL | Freq: Once | INTRAVENOUS | Status: AC | PRN
  Administered 2014-10-11: 80 mL via INTRAVENOUS

## 2014-10-11 MED ORDER — IPRATROPIUM BROMIDE 0.02 % IN SOLN
0.5000 mg | Freq: Four times a day (QID) | RESPIRATORY_TRACT | Status: DC
  Administered 2014-10-11 – 2014-10-12 (×2): 0.5 mg via RESPIRATORY_TRACT
  Filled 2014-10-11 (×2): qty 2.5

## 2014-10-11 MED ORDER — HEPARIN SODIUM (PORCINE) 5000 UNIT/ML IJ SOLN
5000.0000 [IU] | Freq: Three times a day (TID) | INTRAMUSCULAR | Status: DC
  Administered 2014-10-11 – 2014-10-23 (×34): 5000 [IU] via SUBCUTANEOUS
  Filled 2014-10-11 (×37): qty 1

## 2014-10-11 MED ORDER — SODIUM CHLORIDE 0.9 % IV BOLUS (SEPSIS)
500.0000 mL | Freq: Once | INTRAVENOUS | Status: AC
  Administered 2014-10-11: 500 mL via INTRAVENOUS

## 2014-10-11 MED ORDER — ALPRAZOLAM 0.25 MG PO TABS
0.2500 mg | ORAL_TABLET | Freq: Two times a day (BID) | ORAL | Status: DC | PRN
  Administered 2014-10-13 – 2014-10-25 (×19): 0.25 mg via ORAL
  Filled 2014-10-11 (×18): qty 1

## 2014-10-11 MED ORDER — ACETAMINOPHEN 325 MG PO TABS: 650.0000 mg | ORAL_TABLET | Freq: Four times a day (QID) | ORAL | Status: DC | PRN

## 2014-10-11 MED ORDER — DILTIAZEM HCL 100 MG IV SOLR
5.0000 mg/h | INTRAVENOUS | Status: DC
  Administered 2014-10-12: 5 mg/h via INTRAVENOUS

## 2014-10-11 MED ORDER — ONDANSETRON HCL 4 MG/2ML IJ SOLN
4.0000 mg | Freq: Four times a day (QID) | INTRAMUSCULAR | Status: DC | PRN
  Administered 2014-10-11 – 2014-10-23 (×10): 4 mg via INTRAVENOUS
  Filled 2014-10-11 (×10): qty 2

## 2014-10-11 MED ORDER — LEVOFLOXACIN IN D5W 500 MG/100ML IV SOLN
500.0000 mg | INTRAVENOUS | Status: DC
  Administered 2014-10-11 – 2014-10-16 (×6): 500 mg via INTRAVENOUS
  Filled 2014-10-11 (×8): qty 100

## 2014-10-11 MED ORDER — MORPHINE SULFATE 4 MG/ML IJ SOLN
4.0000 mg | INTRAMUSCULAR | Status: DC | PRN
  Administered 2014-10-12 – 2014-10-15 (×9): 4 mg via INTRAVENOUS
  Filled 2014-10-11 (×9): qty 1

## 2014-10-11 MED ORDER — OXYCODONE HCL 5 MG PO TABS
5.0000 mg | ORAL_TABLET | ORAL | Status: DC | PRN
  Administered 2014-10-12 – 2014-10-15 (×13): 5 mg via ORAL
  Filled 2014-10-11 (×13): qty 1

## 2014-10-11 MED ORDER — BISACODYL 10 MG RE SUPP: 10.0000 mg | Freq: Every day | RECTAL | Status: DC | PRN

## 2014-10-11 MED ORDER — DOCUSATE SODIUM 100 MG PO CAPS
100.0000 mg | ORAL_CAPSULE | Freq: Two times a day (BID) | ORAL | Status: DC
  Administered 2014-10-11 – 2014-10-26 (×29): 100 mg via ORAL
  Filled 2014-10-11 (×30): qty 1

## 2014-10-11 MED ORDER — HYDROMORPHONE HCL 1 MG/ML IJ SOLN
1.0000 mg | Freq: Once | INTRAMUSCULAR | Status: AC
  Administered 2014-10-11: 1 mg via INTRAVENOUS
  Filled 2014-10-11: qty 1

## 2014-10-11 MED ORDER — ACETAMINOPHEN 650 MG RE SUPP: 650.0000 mg | Freq: Four times a day (QID) | RECTAL | Status: DC | PRN

## 2014-10-11 MED ORDER — GABAPENTIN 100 MG PO CAPS
100.0000 mg | ORAL_CAPSULE | Freq: Three times a day (TID) | ORAL | Status: DC
  Administered 2014-10-11 – 2014-10-26 (×43): 100 mg via ORAL
  Filled 2014-10-11 (×45): qty 1

## 2014-10-11 MED ORDER — DILTIAZEM LOAD VIA INFUSION
20.0000 mg | Freq: Once | INTRAVENOUS | Status: DC
  Filled 2014-10-11: qty 20

## 2014-10-11 MED ORDER — POLYETHYLENE GLYCOL 3350 17 G PO PACK
17.0000 g | PACK | Freq: Every day | ORAL | Status: DC | PRN
  Administered 2014-10-16 – 2014-10-18 (×2): 17 g via ORAL
  Filled 2014-10-11 (×4): qty 1

## 2014-10-11 MED ORDER — INSULIN GLARGINE 100 UNIT/ML ~~LOC~~ SOLN
20.0000 [IU] | Freq: Every day | SUBCUTANEOUS | Status: DC
  Administered 2014-10-11: 20 [IU] via SUBCUTANEOUS
  Filled 2014-10-11 (×2): qty 0.2

## 2014-10-11 MED ORDER — QUETIAPINE FUMARATE 50 MG PO TABS
50.0000 mg | ORAL_TABLET | Freq: Every day | ORAL | Status: DC
  Administered 2014-10-11 – 2014-10-25 (×14): 50 mg via ORAL
  Filled 2014-10-11 (×15): qty 1

## 2014-10-11 NOTE — ED Notes (Signed)
Patient transported to MRI 

## 2014-10-11 NOTE — ED Notes (Signed)
Hospitalist aware of pt's increasing heart rate

## 2014-10-11 NOTE — Consult Note (Signed)
CARDIOLOGY CONSULT NOTE  Patient ID: Sabrina Adkins, MRN: [id number], DOB/AGE: [birth year] 53 y.o. Admit date: 10/11/2014 Date of Consult: 10/11/2014  Primary Physician: Philis Fendt, MD Primary Cardiologist: unassigned  Chief Complaint: concern re: skin findings Reason for Consultation: SVT and paroxysmal afib  HPI: 53 y.o. female w/ PMHx significant for DM, HTN,  who presented to [facility] on 10/11/2014 with complaints of skin nodules. Recent diagnosis of lung cancer (SCC) with plans of seen oncology soon. Presenting with multiple subcutaneous nodules on chest.  She reports that she has no diagnosed arrhythmia but has been told by her PCP that she has some arrhythmia (sounds more like PVCs but was told to switch to decaf coffee). Over that last week, she has been under a significant amount of stress. She reports concurrently that she has been having episodic palpitations, similar to what occurred in the ER today. No chest pain or SOB with them; just the uncomfortable palpitations. Knows what afib is as her ex husband carries the diagnosis. Reports a remote history of a stress test in [place] but it was for screening purposes she states. No echo.  During workup for nodules and tachycardia, underwent CT scan with demonstrated progression of the nodules. Concern that they may represent metastatic dz.  While in ER, sinus tachycardia increased in rate to rapid afib. Given 10 mg cardizem x 2 and returned to sinus rhythm.  Review of EKGs in EPIC, show sinus tachycardia, RAD (errant lead placement?), LAE and PRWP, unchanged from prior except for axis.  Review of telemetry shows paroxysmal runs of atrial fibrillation (fast and irregular) with 2 episodes of brief (~10 beats) of wide complex is most consistent with aberrency (rate is faster).    Past Medical History  Diagnosis Date  . Diabetes mellitus without complication   . Anxiety   . Clotting disorder     right leg DVT  .  Coagulopathy 09/02/2014  . Hypertension   . Shortness of breath     "when I am getting ready to lay down"  . Pneumonia     2015  . Depression   . Bipolar disorder   . GERD (gastroesophageal reflux disease)   . Arthritis     RA  . DVT (deep venous thrombosis)     > 10 years ago, was on coumadin      Surgical History:  Past Surgical History  Procedure Laterality Date  . Cholecystectomy    . Tubal ligation    . Carpal tunnel release Right   . Ablation    . Video bronchoscopy with endobronchial ultrasound N/A 09/23/2014    Procedure: VIDEO BRONCHOSCOPY WITH ENDOBRONCHIAL ULTRASOUND;  Surgeon: Collene Gobble, MD;  Location: [place];  Service: Thoracic;  Laterality: N/A;  . Video bronchoscopy with endobronchial navigation N/A 09/23/2014    Procedure: VIDEO BRONCHOSCOPY WITH ENDOBRONCHIAL NAVIGATION;  Surgeon: Collene Gobble, MD;  Location: [place];  Service: Thoracic;  Laterality: N/A;     Home Meds: Prior to Admission medications   Medication Sig Start Date End Date Taking? Authorizing Provider  amLODipine (NORVASC) 5 MG tablet Take 1 tablet (5 mg total) by mouth daily. 08/13/14  Yes Kinnie Feil, MD  diazepam (VALIUM) 5 MG tablet Take 1 tablet (5 mg total) by mouth 2 (two) times daily. 09/15/14  Yes Larene Pickett, PA-C  gabapentin (NEURONTIN) 100 MG capsule Take 1 capsule (100 mg total) by mouth 3 (three) times daily. 08/13/14  Yes Kinnie Feil, MD  glipiZIDE (  GLUCOTROL) 5 MG tablet Take 2 tablets (10 mg total) by mouth 2 (two) times daily before a meal. 08/13/14  Yes Kinnie Feil, MD  hydrochlorothiazide (HYDRODIURIL) 25 MG tablet Take 1 tablet (25 mg total) by mouth daily. 08/13/14  Yes Kinnie Feil, MD  insulin glargine (LANTUS) 100 UNIT/ML injection Inject 0.35 mLs (35 Units total) into the skin at bedtime. 08/13/14  Yes Kinnie Feil, MD  lisinopril (PRINIVIL,ZESTRIL) 40 MG tablet Take 1 tablet (40 mg total) by mouth daily. 08/13/14  Yes Kinnie Feil, MD  LORazepam  (ATIVAN) 0.5 MG tablet Take 1 tablet (0.5 mg total) by mouth 2 (two) times daily as needed. For nerves 08/13/14  Yes Kinnie Feil, MD  metFORMIN (GLUCOPHAGE) 500 MG tablet Take 2 tablets (1,000 mg total) by mouth 2 (two) times daily with a meal. 08/13/14  Yes Kinnie Feil, MD  Multiple Vitamin (MULTIVITAMIN WITH MINERALS) TABS tablet Take 1 tablet by mouth daily.   Yes Historical Provider, MD  pioglitazone (ACTOS) 30 MG tablet Take 1 tablet (30 mg total) by mouth daily. 08/13/14  Yes Kinnie Feil, MD  QUEtiapine (SEROQUEL) 50 MG tablet Take 1 tablet (50 mg total) by mouth at bedtime. 08/13/14  Yes Kinnie Feil, MD  ALPRAZolam Duanne Moron) 0.5 MG tablet Take 1 tablet prior to MRI scan for claustrophobia. 10/08/14   Collene Gobble, MD  dicloxacillin (DYNAPEN) 500 MG capsule Every 6 hours x 10 days 10/07/14   Collene Gobble, MD    Inpatient Medications:   . diltiazem     And  . diltiazem (CARDIZEM) infusion    . levofloxacin (LEVAQUIN) IV      Allergies:  Allergies  Allergen Reactions  . Aspirin Hives, Shortness Of Breath, Itching and Rash    Okay to tolerate coated aspirin-MUST BE E.C.  . Ibuprofen Hives, Shortness Of Breath, Itching and Rash  . Penicillins Anaphylaxis and Hives    THROAT CLOSES  . Orange Juice [Orange Oil]     hives  . Iodine Hives, Itching and Rash  . Latex Hives, Itching and Rash    History   Social History  . Marital Status: Single    Spouse Name: N/A    Number of Children: N/A  . Years of Education: N/A   Occupational History  . Not on file.   Social History Main Topics  . Smoking status: Current Every Day Smoker -- 0.50 packs/day for 30 years    Types: Cigarettes  . Smokeless tobacco: Never Used  . Alcohol Use: Yes     Comment: occ  . Drug Use: Yes    Special: Marijuana  . Sexual Activity: Not on file   Other Topics Concern  . Not on file   Social History Narrative     Family History  Problem Relation Age of Onset  . Heart disease  Father   . Clotting disorder Mother   . Rheum arthritis Mother      Review of Systems: General: negative for chills, fever, night sweats or weight changes.  Cardiovascular: see HPI Dermatological: negative for rash Respiratory: negative for cough or wheezing Urologic: negative for hematuria Abdominal: negative for nausea, vomiting, diarrhea, bright red blood per rectum, melena, or hematemesis Neurologic: negative for visual changes, syncope, or dizziness All other systems reviewed and are otherwise negative except as noted above.  Labs: No results for input(s): CKTOTAL, CKMB, TROPONINI in the last 72 hours. Lab Results  Component Value Date   WBC  19.5* 10/11/2014   HGB 14.4 10/11/2014   HCT 42.8 10/11/2014   MCV 71.1* 10/11/2014   PLT 218 10/11/2014    Recent Labs Lab 10/11/14 1154  NA 132*  K 3.9  CL 90*  CO2 29  BUN 12  CREATININE 0.64  CALCIUM 10.2  PROT 7.2  BILITOT 0.6  ALKPHOS 67  ALT 17  AST 17  GLUCOSE 349*   No results found for: CHOL, HDL, LDLCALC, TRIG No results found for: DDIMER  Radiology/Studies:  Dg Chest 2 View  10/11/2014   CLINICAL DATA:  Chest pain  EXAM: CHEST  2 VIEW  COMPARISON:  CT from earlier in the same day  FINDINGS: Cardiac show remains elevated with right basilar atelectasis. A dominant right pulmonary nodule is noted as well as some smaller nodules similar to that seen on the prior CT examination. No sizable effusion is noted. No bony abnormality is seen.  IMPRESSION: Right basilar atelectasis with evidence of multiple pulmonary nodules.   Electronically Signed   By: Inez Catalina M.D.   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 Now with shortness of breath. History of DVT.  EXAM: CT ANGIOGRAPHY CHEST WITH CONTRAST  TECHNIQUE: Multidetector CT imaging of  the chest was performed using the standard protocol during bolus administration of intravenous contrast. Multiplanar CT image reconstructions and MIPs were obtained to evaluate the vascular anatomy.  CONTRAST:  31mL OMNIPAQUE IOHEXOL 350 MG/ML SOLN  COMPARISON:  CT of the chest without contrast on 08/12/2014  FINDINGS: There has been significant progression of metastatic lung carcinoma in the chest and visualized upper abdomen. The dominant right middle lobe mass now measures approximately 2.9 x 3.6 cm compared to approximately 2.0 x 2.2 cm on the prior study. A number of metastatic pulmonary nodules are identified in both lungs. Anterior right upper lobe nodule measures 1.2 cm. Superior segment right lower lobe nodule measures 0.5 cm. Nodule abutting the lower major fissure measures 1.3 cm. Right lower lobe peripheral nodule measures 0.7 cm. Several other smaller right lower lobe nodules present. There are at least 6 new pulmonary nodules in the left lower lobe with the largest measuring 1.1 cm. Stable 1.6 cm ground-glass opacity at the right lung apex.  Metastatic adenopathy in the mediastinum also shows significant progression since the prior CT. The dominant anterior mediastinal lymph node mass abutting both the ascending thoracic aorta and SVC now measures approximately 3.2 x 4.7 cm as compared to roughly 2.5 cm in short axis on the previous exam. Largest right peritracheal lymph node measures 1.6 cm in short axis. Subcarinal lymph node mass measures 2.6 x 3.5 cm. Prevascular lymph node mass anterior to the aortic arch shows enlargement and measures 1.5 cm in short axis. Bilateral axillary lymph nodes also show enlargement with the largest left axillary lymph node measuring 2.3 cm in short axis and the largest right axillary lymph node measuring 1.6 cm in short axis.  Multiple metastatic subcutaneous nodules are identified and are completely new since the prior examination. The largest subcutaneous nodule is in  the right anterior lower costal subcutaneous fat and measures 2.8 cm in diameter. This was not present previously. A number of other smaller subcutaneous nodules are present in the left anterior upper abdominal wall fat, fat abutting the chest wall laterally on the left and  the subcutaneous fat overlying the left scapula.  The visualized upper abdomen also shows evidence of bilateral adrenal metastasis which were visualized previously. The dominant right adrenal metastasis now measures approximately 3.4 x 3.6 cm compared to 2.7 cm in diameter on the prior study. One or 2 separate left adrenal metastases measure in conglomerate dimensions approximately 2.5 x 3.8 cm. New 1.2 cm nodule posterior to the left upper kidney in the perinephric fat is consistent with metastasis.  No evidence of pulmonary embolism. The thoracic aorta is normal in caliber and shows no evidence of dissection. Diffuse spondylosis of the thoracic spine present. There are no visualized bone metastases in the chest. No pleural effusions or pulmonary infiltrates. There is a small amount of pericardial fluid which appears more prominent compared to the prior CT.  Review of the MIP images confirms the above findings.  IMPRESSION: Marked progression of metastatic disease in the chest and upper abdomen, as detailed above. There is enlargement of the dominant right middle lobe lung carcinoma an a number of new metastatic nodules in both lungs. Metastatic adenopathy in the mediastinum and both axillary regions has increased in size since the prior CT. 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 There are a number of new subcutaneous metastatic nodules in the subcutaneous fat. New posterior left perinephric metastatic nodules also visualized. The patient presumably has not received any interval treatment since the prior CT. Recommend oncologic consultation. A formal PET scan would also be helpful to complete staging evaluation prior to initiation  of any cancer treatment. There is no evidence of pulmonary embolism.  Findings were communicated directly to Dr. Canary Brim in the Emergency Department.   Electronically Signed   By: Aletta Edouard M.D.   On: 10/11/2014 14:30   Dg Chest Port 1 View  09/23/2014   CLINICAL DATA:  Postop bronchoscopy.  EXAM: PORTABLE CHEST - 1 VIEW  COMPARISON:  08/12/2014  FINDINGS: Low lung volumes. Right lower lobe airspace opacity could be related to bronchoscopy or represent atelectasis. No pneumothorax. Previously seen mid right lung nodule not well visualized on today's study, likely due to the low volumes and airspace disease. Minimal left base atelectasis. Heart is borderline in size.  IMPRESSION: Low lung volumes with bibasilar opacities, likely atelectasis, right greater than left. No pneumothorax.   Electronically Signed   By: Rolm Baptise M.D.   On: 09/23/2014 13:10   Dg C-arm Bronchoscopy  09/23/2014   CLINICAL DATA: right middle lobe lung mass   C-ARM BRONCHOSCOPY  Fluoroscopy was utilized by the requesting physician.  No radiographic  interpretation.     EKG: see HPI  Physical Exam: Blood pressure 123/78, pulse 104, temperature 98.3 F (36.8 C), temperature source Oral, resp. rate 20, SpO2 95 %. General:, in no acute distress. Head: Normocephalic, atraumatic, sclera non-icteric, no xanthomas, nares are without discharge.  Neck: Supple. Negative for carotid bruits. JVD not elevated. Lungs: Clear bilaterally to auscultation without wheezes, rales, or rhonchi. Breathing is unlabored. Heart: RRR with S1 S2. No murmurs, rubs, or gallops appreciated. Abdomen: non-distended  Msk:  Strength and tone appear normal for age. Extremities: No clubbing or cyanosis. No edema.  Distal pedal pulses are 2+ and equal bilaterally. Neuro: Alert and oriented X 3. Moves all extremities spontaneously. Psych:  Flattened affect   Assessment and Plan:  Problem List 1. Paroxysmal atrial fibrillation with RVR, likely  periodic aberrancy, 2. Metastatic lung cancer (lung, skin?, brain) 3. DM2 4. HTN 6. Remote h/o DVT 7. Small pericardial effusion  noted on CT.  53 y.o. female w/ PMHx significant for DM, HTN,  who presented to [facility] on 10/11/2014 with recent diagnosis of lung cancer (SCC) with workup of metastatic process proceeding currently with course complicated by afib with RVR, now resolved after diltiazem  Secondary causes being investigated with echo and thyroid studies. Denies ETOH or illicits or excessive caffeine. Other possibility is direct irritation to the myocardium as noted by pericardial effusion and potential for metastatic lesions. Echo will help visualize further.  In terms of rate control, patient would benefit from having a nodal blocking agent on board to help prevent RVR when she converts. Will start PO diltiazem and sacrifice amlodipine. In regards to stroke risk reduction, her CHADS2vasc score is 3 (female, DM2, HTN) and she would, in the long term, benefit from full anticoagulation. However, in the short term, with multiple procedures planned, use of aspirin is reasonable to reduce stroke risk. However, her chart lists allergy to aspirin, so would recommend using clopidogrel 75 mg qday instead.  Summary of recs: -continue telemetry -echo, TSH -diltiazem (done, sacrificed amlodipine) -anticoagulation as described above  Thank you for this consult. Please call with questions.We will follow up tomorrow.    Signed, Elias Else, Mila Homer MD 10/11/2014, 5:24 PM

## 2014-10-11 NOTE — ED Provider Notes (Signed)
CSN: [id number]     Arrival date & time 10/11/14  7253 History   First MD Initiated Contact with Patient 10/11/14 1104     Chief Complaint  Patient presents with  . Abscess     (Consider location/radiation/quality/duration/timing/severity/associated sxs/prior Treatment) HPI Comments: Patient with recent diagnosis of squamous cell lung CA, h/o DVT remotely treated with coumadin (about 10 years ago), h/o DM -- presents with multiple complaints. She has had multiple small subcutaneous nodules over her body for the past 2 weeks. None have draining but all are painful. No fevers. Her pulmonologist was going to treat her for boils, however she was unable to fill rx due to penicillin allergy.   Patient also complains of worsening shortness of breath over the past several days with associated nonproductive cough. Chest pain is worse with palpation of her chest wall. No pleuritic chest pain. No hemoptysis. She becomes SOB with any activity. No LE edema.   Patient has also had nausea, vomiting, and abdominal pain. She is able to drink and eat foods that are cold but vomits whenever she drinks warm liquids or warm foods. Pain is not worse in any particular part of her abdomen. She has no dysuria or hematuria. No treatments prior to arrival.  Regarding her treatment plan: she has PET/MR brain scheduled for 11/06, initial oncology appointment scheduled for 11/12. She is planning on moving to [place] weekend of 11/28 for definitive care of her lung CA and to be closer to her children.   The history is provided by the patient and medical records.    Past Medical History  Diagnosis Date  . Diabetes mellitus without complication   . Anxiety   . Clotting disorder     right leg DVT  . Coagulopathy 09/02/2014  . Hypertension   . Shortness of breath     "when I am getting ready to lay down"  . Pneumonia     2015  . Depression   . Bipolar disorder   . GERD (gastroesophageal reflux disease)   .  Arthritis     RA  . DVT (deep venous thrombosis)     > 10 years ago, was on coumadin   Past Surgical History  Procedure Laterality Date  . Cholecystectomy    . Tubal ligation    . Carpal tunnel release Right   . Ablation    . Video bronchoscopy with endobronchial ultrasound N/A 09/23/2014    Procedure: VIDEO BRONCHOSCOPY WITH ENDOBRONCHIAL ULTRASOUND;  Surgeon: Collene Gobble, MD;  Location: [facility];  Service: Thoracic;  Laterality: N/A;  . Video bronchoscopy with endobronchial navigation N/A 09/23/2014    Procedure: VIDEO BRONCHOSCOPY WITH ENDOBRONCHIAL NAVIGATION;  Surgeon: Collene Gobble, MD;  Location: [facility];  Service: Thoracic;  Laterality: N/A;   Family History  Problem Relation Age of Onset  . Heart disease Father   . Clotting disorder Mother   . Rheum arthritis Mother    History  Substance Use Topics  . Smoking status: Current Every Day Smoker -- 0.50 packs/day for 30 years    Types: Cigarettes  . Smokeless tobacco: Never Used  . Alcohol Use: Yes     Comment: occ   OB History    No data available     Review of Systems  Constitutional: Negative for fever.  HENT: Negative for rhinorrhea and sore throat.   Eyes: Negative for redness.  Respiratory: Positive for cough, chest tightness and shortness of breath.   Cardiovascular: Positive for chest pain.  Negative for leg swelling.  Gastrointestinal: Positive for nausea, vomiting and abdominal pain. Negative for diarrhea.  Genitourinary: Negative for dysuria.  Musculoskeletal: Negative for myalgias.  Skin: Negative for rash.       nodules  Neurological: Negative for headaches.      Allergies  Aspirin; Ibuprofen; Penicillins; Orange juice; Iodine; and Latex  Home Medications   Prior to Admission medications   Medication Sig Start Date End Date Taking? Authorizing Provider  ALPRAZolam Duanne Moron) 0.5 MG tablet Take 1 tablet prior to MRI scan for claustrophobia. 10/08/14   Collene Gobble, MD  amLODipine (NORVASC) 5 MG  tablet Take 1 tablet (5 mg total) by mouth daily. 08/13/14   Kinnie Feil, MD  diazepam (VALIUM) 5 MG tablet Take 1 tablet (5 mg total) by mouth 2 (two) times daily. 09/15/14   Larene Pickett, PA-C  dicloxacillin (DYNAPEN) 500 MG capsule Every 6 hours x 10 days 10/07/14   Collene Gobble, MD  gabapentin (NEURONTIN) 100 MG capsule Take 1 capsule (100 mg total) by mouth 3 (three) times daily. 08/13/14   Kinnie Feil, MD  glipiZIDE (GLUCOTROL) 5 MG tablet Take 2 tablets (10 mg total) by mouth 2 (two) times daily before a meal. 08/13/14   Kinnie Feil, MD  hydrochlorothiazide (HYDRODIURIL) 25 MG tablet Take 1 tablet (25 mg total) by mouth daily. 08/13/14   Kinnie Feil, MD  HYDROcodone-acetaminophen (NORCO/VICODIN) 5-325 MG per tablet Take 1-2 tablets by mouth every 6 (six) hours as needed for moderate pain. 08/13/14   Kinnie Feil, MD  insulin glargine (LANTUS) 100 UNIT/ML injection Inject 0.35 mLs (35 Units total) into the skin at bedtime. 08/13/14   Kinnie Feil, MD  lisinopril (PRINIVIL,ZESTRIL) 40 MG tablet Take 1 tablet (40 mg total) by mouth daily. 08/13/14   Kinnie Feil, MD  LORazepam (ATIVAN) 0.5 MG tablet Take 1 tablet (0.5 mg total) by mouth 2 (two) times daily as needed. For nerves 08/13/14   Kinnie Feil, MD  metFORMIN (GLUCOPHAGE) 500 MG tablet Take 2 tablets (1,000 mg total) by mouth 2 (two) times daily with a meal. 08/13/14   Kinnie Feil, MD  oxyCODONE-acetaminophen (PERCOCET/ROXICET) 5-325 MG per tablet Take 1 tablet by mouth every 4 (four) hours as needed. 09/15/14   Larene Pickett, PA-C  pioglitazone (ACTOS) 30 MG tablet Take 1 tablet (30 mg total) by mouth daily. 08/13/14   Kinnie Feil, MD  QUEtiapine (SEROQUEL) 50 MG tablet Take 1 tablet (50 mg total) by mouth at bedtime. 08/13/14   Kinnie Feil, MD   BP 131/97 mmHg  Pulse 131  Temp(Src) 98.2 F (36.8 C) (Oral)  Resp 22  SpO2 98%   Physical Exam  Constitutional: She appears well-developed and  well-nourished.  HENT:  Head: Normocephalic and atraumatic.  Eyes: Conjunctivae are normal. Right eye exhibits no discharge. Left eye exhibits no discharge.  Neck: Normal range of motion. Neck supple. No JVD present.  Cardiovascular: Regular rhythm and normal heart sounds.  Tachycardia present.   No murmur heard. Persistent tachycardia above 120.  Pulmonary/Chest: Effort normal and breath sounds normal. No respiratory distress. She has no wheezes. She has no rales.  Abdominal: Soft. There is no tenderness.  Neurological: She is alert.  Skin: Skin is warm and dry.  Patient with scattered subcutaneous nodules, firm but nonfluctuant, scattered over her neck, beneath breasts, left axilla, abdomen, hips and legs. None are erythematous.  Psychiatric: She has a normal mood and  affect.  Nursing note and vitals reviewed.   ED Course  Procedures (including critical care time) Labs Review Labs Reviewed  CBC WITH DIFFERENTIAL - Abnormal; Notable for the following:    WBC 19.5 (*)    RBC 6.02 (*)    MCV 71.1 (*)    MCH 23.9 (*)    Neutrophils Relative % 78 (*)    Neutro Abs 15.2 (*)    Monocytes Absolute 1.2 (*)    All other components within normal limits  COMPREHENSIVE METABOLIC PANEL - Abnormal; Notable for the following:    Sodium 132 (*)    Chloride 90 (*)    Glucose, Bld 349 (*)    All other components within normal limits  URINALYSIS, ROUTINE W REFLEX MICROSCOPIC - Abnormal; Notable for the following:    APPearance CLOUDY (*)    Glucose, UA >1000 (*)    Hgb urine dipstick LARGE (*)    Ketones, ur 15 (*)    Leukocytes, UA SMALL (*)    All other components within normal limits  URINE MICROSCOPIC-ADD ON - Abnormal; Notable for the following:    Squamous Epithelial / LPF FEW (*)    Bacteria, UA FEW (*)    All other components within normal limits  I-STAT CG4 LACTIC ACID, ED - Abnormal; Notable for the following:    Lactic Acid, Venous 2.33 (*)    All other components within  normal limits  I-STAT VENOUS BLOOD GAS, ED - Abnormal; Notable for the following:    pH, Ven 7.456 (*)    pO2, Ven 51.0 (*)    Bicarbonate 34.8 (*)    Acid-Base Excess 9.0 (*)    All other components within normal limits  URINE CULTURE  CBG MONITORING, ED  I-STAT TROPOININ, ED    Imaging Review Dg Chest 2 View  10/11/2014   CLINICAL DATA:  Chest pain  EXAM: CHEST  2 VIEW  COMPARISON:  CT from earlier in the same day  FINDINGS: Cardiac show remains elevated with right basilar atelectasis. A dominant right pulmonary nodule is noted as well as some smaller nodules similar to that seen on the prior CT examination. No sizable effusion is noted. No bony abnormality is seen.  IMPRESSION: Right basilar atelectasis with evidence of multiple pulmonary nodules.   Electronically Signed   By: Inez Catalina M.D.   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 Now with shortness of breath. History of DVT.  EXAM: CT ANGIOGRAPHY CHEST WITH CONTRAST  TECHNIQUE: Multidetector CT imaging of the chest was performed using the standard protocol during bolus administration of intravenous contrast. Multiplanar CT image reconstructions and MIPs were obtained to evaluate the vascular anatomy.  CONTRAST:  31mL OMNIPAQUE IOHEXOL 350 MG/ML SOLN  COMPARISON:  CT of the chest without contrast on 08/12/2014  FINDINGS: There has been significant progression of metastatic lung carcinoma in the chest and visualized upper abdomen. The dominant right middle lobe mass now measures approximately 2.9 x 3.6 cm compared to approximately 2.0 x 2.2 cm on the prior study. A number of metastatic pulmonary nodules are identified in both lungs. Anterior right upper lobe nodule measures 1.2 cm. Superior segment right lower lobe nodule measures 0.5 cm. Nodule abutting the  lower major fissure measures 1.3 cm. Right lower lobe peripheral nodule measures 0.7 cm. Several other  smaller right lower lobe nodules present. There are at least 6 new pulmonary nodules in the left lower lobe with the largest measuring 1.1 cm. Stable 1.6 cm ground-glass opacity at the right lung apex.  Metastatic adenopathy in the mediastinum also shows significant progression since the prior CT. The dominant anterior mediastinal lymph node mass abutting both the ascending thoracic aorta and SVC now measures approximately 3.2 x 4.7 cm as compared to roughly 2.5 cm in short axis on the previous exam. Largest right peritracheal lymph node measures 1.6 cm in short axis. Subcarinal lymph node mass measures 2.6 x 3.5 cm. Prevascular lymph node mass anterior to the aortic arch shows enlargement and measures 1.5 cm in short axis. Bilateral axillary lymph nodes also show enlargement with the largest left axillary lymph node measuring 2.3 cm in short axis and the largest right axillary lymph node measuring 1.6 cm in short axis.  Multiple metastatic subcutaneous nodules are identified and are completely new since the prior examination. The largest subcutaneous nodule is in the right anterior lower costal subcutaneous fat and measures 2.8 cm in diameter. This was not present previously. A number of other smaller subcutaneous nodules are present in the left anterior upper abdominal wall fat, fat abutting the chest wall laterally on the left and the subcutaneous fat overlying the left scapula.  The visualized upper abdomen also shows evidence of bilateral adrenal metastasis which were visualized previously. The dominant right adrenal metastasis now measures approximately 3.4 x 3.6 cm compared to 2.7 cm in diameter on the prior study. One or 2 separate left adrenal metastases measure in conglomerate dimensions approximately 2.5 x 3.8 cm. New 1.2 cm nodule posterior to the left upper kidney in the perinephric fat is consistent  with metastasis.  No evidence of pulmonary embolism. The thoracic aorta is normal in caliber and shows no evidence of dissection. Diffuse spondylosis of the thoracic spine present. There are no visualized bone metastases in the chest. No pleural effusions or pulmonary infiltrates. There is a small amount of pericardial fluid which appears more prominent compared to the prior CT.  Review of the MIP images confirms the above findings.  IMPRESSION: Marked progression of metastatic disease in the chest and upper abdomen, as detailed above. There is enlargement of the dominant right middle lobe lung carcinoma an a number of new metastatic nodules in both lungs. Metastatic adenopathy in the mediastinum and both axillary regions has increased in size since the prior CT. 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 There are a number of new subcutaneous metastatic nodules in the subcutaneous fat. New posterior left perinephric metastatic nodules also visualized. The patient presumably has not received any interval treatment since the prior CT. Recommend oncologic consultation. A formal PET scan would also be helpful to complete staging evaluation prior to initiation of any cancer treatment. There is no evidence of pulmonary embolism.  Findings were communicated directly to Dr. Canary Brim in the Emergency Department.   Electronically Signed   By: Aletta Edouard M.D.   On: 10/11/2014 14:30     EKG Interpretation   Date/Time:  Sunday October 11 2014 12:40:29 EST Ventricular Rate:  119 PR Interval:  149 QRS Duration: 83 QT Interval:  355 QTC Calculation: 499 R Axis:   99 Text Interpretation:  Sinus tachycardia Ventricular premature complex  Probable left atrial enlargement Low voltage with right axis deviation  Probable anterolateral infarct, old Baseline wander in lead(s) V2 Since  previous tracing rate faster, QT is longer, PVC is  new Confirmed by Canary Brim   MD, [place] [phone]) on 10/11/2014 2:40:29 PM       Patient seen and examined. Work-up initiated. Medications ordered. Discussed with Dr. Canary Brim. There was a delay in obtaining orders/imaging due to brief computer downtime.   Vital signs reviewed and are as follows: BP 131/97 mmHg  Pulse 131  Temp(Src) 98.2 F (36.8 C) (Oral)  Resp 22  SpO2 98%  1:14 PM Pt stable, to imaging. She is requesting something to eat, I told her we cannot at this time. She is stable.   2:51 PM Pain is controlled. CT shows rapid progression of her disease. Discussed with Dr. Canary Brim. Will call oncology for reccs.   3:48 PM Patient's pain returning. Spoke with Dr. Marin Olp. He agrees that patient needs to be seen as soon as possible for initiation of systemic chemotherapy. MR brain would be helpful.   4:11 PM [facility] to admit. Dr. Marin Olp would prefer patient at [facility].   MDM   Final diagnoses:  Chest pain  Acute respiratory failure with hypercapnia  Shortness of breath  Metastatic lung cancer (metastasis from lung to other site), right   Patient to be admitted for urgent completion and initiation of treatment for her metastatic lung cancer. Her cancer has progressed rapidly in the past 2 months. It is apparent that she cannot wait to start treatment until her move to [place]. She needs admission for her respiratory failure.     Carlisle Cater, PA-C 10/11/14 1700

## 2014-10-11 NOTE — ED Notes (Signed)
CBG 247 

## 2014-10-11 NOTE — ED Notes (Signed)
Pt here for multiple abscess all over body. sts fever and she has been vomiting. sts started a few days ago.

## 2014-10-11 NOTE — ED Notes (Signed)
Pt in MRI, report given to floor [address]

## 2014-10-11 NOTE — ED Notes (Signed)
Pt returned from xray

## 2014-10-11 NOTE — ED Notes (Signed)
Hospitalist at bedside 

## 2014-10-11 NOTE — H&P (Addendum)
Patient Demographics  Sabrina Adkins, is a 53 y.o. female  MRN: [id number]   DOB - 02-25-1961  Admit Date - 10/11/2014  Outpatient Primary MD for the patient is Philis Fendt, MD   With History of -  Past Medical History  Diagnosis Date  . Diabetes mellitus without complication   . Anxiety   . Clotting disorder     right leg DVT  . Coagulopathy 09/02/2014  . Hypertension   . Shortness of breath     "when I am getting ready to lay down"  . Pneumonia     2015  . Depression   . Bipolar disorder   . GERD (gastroesophageal reflux disease)   . Arthritis     RA  . DVT (deep venous thrombosis)     > 10 years ago, was on coumadin      Past Surgical History  Procedure Laterality Date  . Cholecystectomy    . Tubal ligation    . Carpal tunnel release Right   . Ablation    . Video bronchoscopy with endobronchial ultrasound N/A 09/23/2014    Procedure: VIDEO BRONCHOSCOPY WITH ENDOBRONCHIAL ULTRASOUND;  Surgeon: Collene Gobble, MD;  Location: [place];  Service: Thoracic;  Laterality: N/A;  . Video bronchoscopy with endobronchial navigation N/A 09/23/2014    Procedure: VIDEO BRONCHOSCOPY WITH ENDOBRONCHIAL NAVIGATION;  Surgeon: Collene Gobble, MD;  Location: [place];  Service: Thoracic;  Laterality: N/A;    in for   Chief Complaint  Patient presents with  . Abscess     HPI  Sabrina Adkins  is a 52 y.o. 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, patient was being planned to be seen by oncology service for further workup next week, she presents today with painful multiple subcutaneous nodules beneath her breasts, and the anterior and shoulder area and upper abdomen  Wall,she thought initially they are boils, NAD patient was tachycardic, so she had CT chest angiogram to rule out PE, which was negative for  PE, but did show rapid progression of her lung cancer, with   subcutaneous nodules actually being metastatic disease, but having intra-abdominal metastasis, as well patient workup was significant for UTI, ED discussed with oncology on call who requested patient be admitted on transfer to [facility] long hospital where she will be evaluated by them. Patient developed supraventricular tachycardia with heart rate being in the 170s, initial EKG showing supraventricular tachycardia with reentry type, as well at one  points patient appears to be having A. Fib on telemetry monitor, she was given IV Cardizem 10 mg 2, heart rate is currently back to normal sinus rhythm.    Review of Systems    In addition to the HPI above,  No Fever-chills, No Headache, No changes with Vision or hearing, No problems swallowing food or Liquids, No Chest pain, Cough or Shortness of Breath, No Abdominal pain, No Nausea or Vommitting, Bowel movements are regular, No Blood in stool or Urine, No dysuria, No new skin rashes or bruises, No new joints pains-aches,  No new weakness, tingling, numbness in any extremity,complains of subcutaneous tender nodules. No recent weight gain or loss, No polyuria, polydypsia or polyphagia, No significant Mental Stressors.  A full 10 point Review of Systems was done, except as stated above, all other Review of Systems were negative.   Social History  History  Substance Use Topics  . Smoking status: Current Every Day Smoker -- 0.50 packs/day for 30 years    Types: Cigarettes  . Smokeless tobacco: Never Used  . Alcohol Use: Yes     Comment: occ    Family History Family History  Problem Relation Age of Onset  . Heart disease Father   . Clotting disorder Mother   . Rheum arthritis Mother     Prior to Admission medications   Medication Sig Start Date End Date Taking? Authorizing Provider  amLODipine (NORVASC) 5 MG tablet Take 1 tablet (5 mg total) by mouth daily. 08/13/14  Yes  Kinnie Feil, MD  diazepam (VALIUM) 5 MG tablet Take 1 tablet (5 mg total) by mouth 2 (two) times daily. 09/15/14  Yes Larene Pickett, PA-C  gabapentin (NEURONTIN) 100 MG capsule Take 1 capsule (100 mg total) by mouth 3 (three) times daily. 08/13/14  Yes Kinnie Feil, MD  glipiZIDE (GLUCOTROL) 5 MG tablet Take 2 tablets (10 mg total) by mouth 2 (two) times daily before a meal. 08/13/14  Yes Kinnie Feil, MD  hydrochlorothiazide (HYDRODIURIL) 25 MG tablet Take 1 tablet (25 mg total) by mouth daily. 08/13/14  Yes Kinnie Feil, MD  insulin glargine (LANTUS) 100 UNIT/ML injection Inject 0.35 mLs (35 Units total) into the skin at bedtime. 08/13/14  Yes Kinnie Feil, MD  lisinopril (PRINIVIL,ZESTRIL) 40 MG tablet Take 1 tablet (40 mg total) by mouth daily. 08/13/14  Yes Kinnie Feil, MD  LORazepam (ATIVAN) 0.5 MG tablet Take 1 tablet (0.5 mg total) by mouth 2 (two) times daily as needed. For nerves 08/13/14  Yes Kinnie Feil, MD  metFORMIN (GLUCOPHAGE) 500 MG tablet Take 2 tablets (1,000 mg total) by mouth 2 (two) times daily with a meal. 08/13/14  Yes Kinnie Feil, MD  Multiple Vitamin (MULTIVITAMIN WITH MINERALS) TABS tablet Take 1 tablet by mouth daily.   Yes Historical Provider, MD  pioglitazone (ACTOS) 30 MG tablet Take 1 tablet (30 mg total) by mouth daily. 08/13/14  Yes Kinnie Feil, MD  QUEtiapine (SEROQUEL) 50 MG tablet Take 1 tablet (50 mg total) by mouth at bedtime. 08/13/14  Yes Kinnie Feil, MD  ALPRAZolam Duanne Moron) 0.5 MG tablet Take 1 tablet prior to MRI scan for claustrophobia. 10/08/14   Collene Gobble, MD  dicloxacillin (DYNAPEN) 500 MG capsule Every 6 hours x 10 days 10/07/14   Collene Gobble, MD    Allergies  Allergen Reactions  . Aspirin Hives, Shortness Of Breath, Itching and Rash    Okay to tolerate coated aspirin-MUST BE E.C.  . Ibuprofen Hives, Shortness Of Breath, Itching and Rash  . Penicillins Anaphylaxis and Hives    THROAT CLOSES  . Orange Juice  [Orange Oil]     hives  . Iodine Hives, Itching and Rash  . Latex Hives, Itching and Rash    Physical Exam  Vitals  Blood pressure 123/78, pulse 104, temperature 98.3 F (36.8 C), temperature source Oral, resp. rate 20, SpO2 95 %.   1. General obese female lying in bed in NAD,    2. Normal affect and insight, Not Suicidal or Homicidal, Awake Alert, Oriented X 3.  3. No F.N deficits, ALL C.Nerves Intact, Strength 5/5 all 4 extremities, Sensation intact all 4 extremities, Plantars down going.  4. Ears and Eyes appear Normal, Conjunctivae clear, PERRLA. Moist Oral Mucosa.  5. Supple Neck, No JVD, No cervical lymphadenopathy appriciated, No Carotid Bruits.  6. Symmetrical Chest wall movement, Good air movement bilaterally,scattered bilateral Rales and rhonchi.  7. tachycardic, No Gallops, Rubs or Murmurs, No Parasternal Heave.  8. Positive Bowel Sounds, Abdomen Soft, No tenderness, No organomegaly appriciated,No rebound -guarding or rigidity.  9.  No Cyanosis, Normal Skin Turgor, No Skin Rash or Bruise.as multiple subcutaneous skin nodules and chest and upper abdominal area tender to palpation.  10. Good muscle tone,  joints appear normal , no effusions, Normal ROM.     Data Review  CBC  Recent Labs Lab 10/11/14 1154  WBC 19.5*  HGB 14.4  HCT 42.8  PLT 218  MCV 71.1*  MCH 23.9*  MCHC 33.6  RDW 13.7  LYMPHSABS 3.1  MONOABS 1.2*  EOSABS 0.0  BASOSABS 0.0   ------------------------------------------------------------------------------------------------------------------  Chemistries   Recent Labs Lab 10/11/14 1154  NA 132*  K 3.9  CL 90*  CO2 29  GLUCOSE 349*  BUN 12  CREATININE 0.64  CALCIUM 10.2  AST 17  ALT 17  ALKPHOS 67  BILITOT 0.6   ------------------------------------------------------------------------------------------------------------------ estimated creatinine clearance is 103.8 mL/min (by C-G formula based on Cr of  0.64). ------------------------------------------------------------------------------------------------------------------ No results for input(s): TSH, T4TOTAL, T3FREE, THYROIDAB in the last 72 hours.  Invalid input(s): FREET3   Coagulation profile No results for input(s): INR, PROTIME in the last 168 hours. ------------------------------------------------------------------------------------------------------------------- No results for input(s): DDIMER in the last 72 hours. -------------------------------------------------------------------------------------------------------------------  Cardiac Enzymes No results for input(s): CKMB, TROPONINI, MYOGLOBIN in the last 168 hours.  Invalid input(s): CK ------------------------------------------------------------------------------------------------------------------ Invalid input(s): POCBNP   ---------------------------------------------------------------------------------------------------------------  Urinalysis    Component Value Date/Time   COLORURINE YELLOW 10/11/2014 1233   APPEARANCEUR CLOUDY* 10/11/2014 1233   LABSPEC 1.010 10/11/2014 1233   PHURINE 6.0 10/11/2014 1233   GLUCOSEU >1000* 10/11/2014 1233   HGBUR LARGE* 10/11/2014 1233   BILIRUBINUR NEGATIVE 10/11/2014 1233   KETONESUR 15* 10/11/2014 1233   PROTEINUR NEGATIVE 10/11/2014 1233   UROBILINOGEN 0.2 10/11/2014 1233   NITRITE NEGATIVE 10/11/2014 1233   LEUKOCYTESUR SMALL* 10/11/2014 1233    ----------------------------------------------------------------------------------------------------------------  Imaging results:   Dg Chest 2 View  10/11/2014   CLINICAL DATA:  Chest pain  EXAM: CHEST  2 VIEW  COMPARISON:  CT from earlier in the same day  FINDINGS: Cardiac show remains elevated with right basilar atelectasis. A dominant right pulmonary nodule is noted as well as some smaller nodules similar to that seen on the prior CT examination. No sizable effusion is  noted. No bony abnormality is seen.  IMPRESSION: Right basilar atelectasis with evidence of multiple pulmonary nodules.   Electronically Signed   By: Inez Catalina M.D.   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 Now with shortness of breath. History of DVT.  EXAM: CT ANGIOGRAPHY CHEST WITH CONTRAST  TECHNIQUE: Multidetector CT imaging of the chest was performed using the standard protocol during bolus administration of intravenous contrast. Multiplanar CT image reconstructions and MIPs were obtained to evaluate the vascular anatomy.  CONTRAST:  10mL OMNIPAQUE IOHEXOL 350 MG/ML SOLN  COMPARISON:  CT of the chest without contrast on 08/12/2014  FINDINGS: There has been significant progression of metastatic lung carcinoma in the chest and visualized upper abdomen. The dominant right middle lobe mass now measures approximately 2.9 x 3.6 cm compared to approximately 2.0 x 2.2 cm on the prior study.  A number of metastatic pulmonary nodules are identified in both lungs. Anterior right upper lobe nodule measures 1.2 cm. Superior segment right lower lobe nodule measures 0.5 cm. Nodule abutting the lower major fissure measures 1.3 cm. Right lower lobe peripheral nodule measures 0.7 cm. Several other smaller right lower lobe nodules present. There are at least 6 new pulmonary nodules in the left lower lobe with the largest measuring 1.1 cm. Stable 1.6 cm ground-glass opacity at the right lung apex.  Metastatic adenopathy in the mediastinum also shows significant progression since the prior CT. The dominant anterior mediastinal lymph node mass abutting both the ascending thoracic aorta and SVC now measures approximately 3.2 x 4.7 cm as compared to roughly 2.5 cm in short axis on the previous exam. Largest right peritracheal lymph node measures  1.6 cm in short axis. Subcarinal lymph node mass measures 2.6 x 3.5 cm. Prevascular lymph node mass anterior to the aortic arch shows enlargement and measures 1.5 cm in short axis. Bilateral axillary lymph nodes also show enlargement with the largest left axillary lymph node measuring 2.3 cm in short axis and the largest right axillary lymph node measuring 1.6 cm in short axis.  Multiple metastatic subcutaneous nodules are identified and are completely new since the prior examination. The largest subcutaneous nodule is in the right anterior lower costal subcutaneous fat and measures 2.8 cm in diameter. This was not present previously. A number of other smaller subcutaneous nodules are present in the left anterior upper abdominal wall fat, fat abutting the chest wall laterally on the left and the subcutaneous fat overlying the left scapula.  The visualized upper abdomen also shows evidence of bilateral adrenal metastasis which were visualized previously. The dominant right adrenal metastasis now measures approximately 3.4 x 3.6 cm compared to 2.7 cm in diameter on the prior study. One or 2 separate left adrenal metastases measure in conglomerate dimensions approximately 2.5 x 3.8 cm. New 1.2 cm nodule posterior to the left upper kidney in the perinephric fat is consistent with metastasis.  No evidence of pulmonary embolism. The thoracic aorta is normal in caliber and shows no evidence of dissection. Diffuse spondylosis of the thoracic spine present. There are no visualized bone metastases in the chest. No pleural effusions or pulmonary infiltrates. There is a small amount of pericardial fluid which appears more prominent compared to the prior CT.  Review of the MIP images confirms the above findings.  IMPRESSION: Marked progression of metastatic disease in the chest and upper abdomen, as detailed above. There is enlargement of the dominant right middle lobe lung carcinoma an a number of new metastatic nodules in both  lungs. Metastatic adenopathy in the mediastinum and both axillary regions has increased in size since the prior CT. 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 There are a number of new subcutaneous metastatic nodules in the subcutaneous fat. New posterior left perinephric metastatic nodules also visualized. The patient presumably has not received any interval treatment since the prior CT. Recommend oncologic consultation. A formal PET scan would also be helpful to complete staging evaluation prior to initiation of any cancer treatment. There is no evidence of pulmonary embolism.  Findings were communicated directly to Dr. Canary Brim in the Emergency Department.   Electronically Signed   By: Aletta Edouard M.D.   On: 10/11/2014 14:30    My personal review of EKG: Rhythm supraventricular tachycardia with AV nodal reentry, Rate  172 /min, QTc 477, no Acute ST changes    Assessment & Plan  Active Problems:   Metastatic lung cancer (metastasis from lung to other site)   DM (diabetes mellitus)   HTN (hypertension)   UTI (lower urinary tract infection)   Supraventricular tachycardia    Metastatic lung cancer -ED consulted oncology on call DR Marin Olp ,who requested the patient to be admitted for further workup and possible need for treatment including chemo and radiation, will obtain MRI head without contrast to rule out any metastases,initial plan to transfer patient to [facility] was put  on hold till patient is more stable given his current supraventricular tachycardia.  Supraventricular tachycardia -patient denies any cardiac history, EKG showing supraventricular cardia with AV nodal reentry,  she had A. Fib on telemetry as well, but not captured on EKG, responded to IV Cardizem,currently back to normal sinus rhythm, discussed with cardiology, will obtain TSH, 2-D echo, monitor on telemetry.   UTI -Start on levofloxacin  Hypertension -Blood pressure on the lower side, hold home  medication.  Diabetes mellitus -Hold all oral hypoglycemic agents -Resume on labs was at a lower dose, to start on insulin sliding scale   DVT Prophylaxis-  heparin - SCDs    Family Communication: Admission, patients condition and plan of care including tests being ordered have been discussed with the patient and both daughters over the phonewho indicate understanding and agree with the plan and Code Status.  Code Status Full code  Likely DC to  home  Condition GUARDED    Time spent in minutes : 60 minutes    Dijon Cosens M.D on 10/11/2014 at 5:16 PM  Between 7am to 7pm - Pager - [phone]  After 7pm go to [email or web address] - password [email or web address]  And look for the night coverage person covering me after hours  [facility] Office  [phone]   **Disclaimer: This note may have been dictated with voice recognition software. Similar sounding words can inadvertently be transcribed and this note may contain transcription errors which may not have been corrected upon publication of note.**

## 2014-10-12 ENCOUNTER — Encounter (HOSPITAL_COMMUNITY): Payer: Self-pay | Admitting: General Practice

## 2014-10-12 DIAGNOSIS — C7802 Secondary malignant neoplasm of left lung: Secondary | ICD-10-CM

## 2014-10-12 DIAGNOSIS — I4891 Unspecified atrial fibrillation: Secondary | ICD-10-CM

## 2014-10-12 DIAGNOSIS — C7971 Secondary malignant neoplasm of right adrenal gland: Secondary | ICD-10-CM

## 2014-10-12 DIAGNOSIS — I480 Paroxysmal atrial fibrillation: Secondary | ICD-10-CM

## 2014-10-12 DIAGNOSIS — C7972 Secondary malignant neoplasm of left adrenal gland: Secondary | ICD-10-CM

## 2014-10-12 DIAGNOSIS — C801 Malignant (primary) neoplasm, unspecified: Secondary | ICD-10-CM

## 2014-10-12 DIAGNOSIS — D72829 Elevated white blood cell count, unspecified: Secondary | ICD-10-CM

## 2014-10-12 DIAGNOSIS — Z86718 Personal history of other venous thrombosis and embolism: Secondary | ICD-10-CM

## 2014-10-12 DIAGNOSIS — C342 Malignant neoplasm of middle lobe, bronchus or lung: Principal | ICD-10-CM

## 2014-10-12 DIAGNOSIS — E118 Type 2 diabetes mellitus with unspecified complications: Secondary | ICD-10-CM

## 2014-10-12 DIAGNOSIS — C7931 Secondary malignant neoplasm of brain: Secondary | ICD-10-CM

## 2014-10-12 HISTORY — DX: Malignant (primary) neoplasm, unspecified: C80.1

## 2014-10-12 LAB — GLUCOSE, CAPILLARY
GLUCOSE-CAPILLARY: 417 mg/dL — AB (ref 70–99)
GLUCOSE-CAPILLARY: 363 mg/dL — AB (ref 70–99)
GLUCOSE-CAPILLARY: 368 mg/dL — AB (ref 70–99)
Glucose-Capillary: 247 mg/dL — ABNORMAL HIGH (ref 70–99)
Glucose-Capillary: 305 mg/dL — ABNORMAL HIGH (ref 70–99)
Glucose-Capillary: 282 mg/dL — ABNORMAL HIGH (ref 70–99)

## 2014-10-12 LAB — BASIC METABOLIC PANEL
Anion gap: 10 (ref 5–15)
BUN: 13 mg/dL (ref 6–23)
CHLORIDE: 94 meq/L — AB (ref 96–112)
CO2: 33 mEq/L — ABNORMAL HIGH (ref 19–32)
CREATININE: 0.73 mg/dL (ref 0.50–1.10)
Calcium: 10 mg/dL (ref 8.4–10.5)
GFR calc Af Amer: >90 mL/min (ref >90)
GFR calc non Af Amer: >90 mL/min (ref >90)
GLUCOSE: 422 mg/dL — AB (ref 70–99)
Potassium: 4 mEq/L (ref 3.7–5.3)
Sodium: 137 mEq/L (ref 137–147)

## 2014-10-12 LAB — CBC
HCT: 41.1 % (ref 36.0–46.0)
HEMOGLOBIN: 13.6 g/dL (ref 12.0–15.0)
MCH: 23.4 pg — ABNORMAL LOW (ref 26.0–34.0)
MCHC: 33.1 g/dL (ref 30.0–36.0)
MCV: 70.7 fL — ABNORMAL LOW (ref 78.0–100.0)
Platelets: 204 10*3/uL (ref 150–400)
RBC: 5.81 MIL/uL — ABNORMAL HIGH (ref 3.87–5.11)
RDW: 13.8 % (ref 11.5–15.5)
WBC: 21 10*3/uL — AB (ref 4.0–10.5)

## 2014-10-12 LAB — MRSA PCR SCREENING: MRSA by PCR: NEGATIVE

## 2014-10-12 MED ORDER — METOPROLOL TARTRATE 25 MG PO TABS
25.0000 mg | ORAL_TABLET | Freq: Three times a day (TID) | ORAL | Status: DC
  Administered 2014-10-12 – 2014-10-16 (×14): 25 mg via ORAL
  Filled 2014-10-12 (×15): qty 1

## 2014-10-12 MED ORDER — INSULIN ASPART 100 UNIT/ML ~~LOC~~ SOLN
10.0000 [IU] | Freq: Once | SUBCUTANEOUS | Status: AC
  Administered 2014-10-12: 10 [IU] via SUBCUTANEOUS

## 2014-10-12 MED ORDER — IPRATROPIUM-ALBUTEROL 0.5-2.5 (3) MG/3ML IN SOLN
RESPIRATORY_TRACT | Status: AC
  Filled 2014-10-12: qty 3

## 2014-10-12 MED ORDER — SODIUM CHLORIDE 0.9 % IV SOLN
INTRAVENOUS | Status: DC
  Administered 2014-10-12 – 2014-10-22 (×4): via INTRAVENOUS

## 2014-10-12 MED ORDER — IPRATROPIUM-ALBUTEROL 0.5-2.5 (3) MG/3ML IN SOLN
3.0000 mL | Freq: Four times a day (QID) | RESPIRATORY_TRACT | Status: DC
  Administered 2014-10-12 (×3): 3 mL via RESPIRATORY_TRACT
  Filled 2014-10-12 (×2): qty 3

## 2014-10-12 MED ORDER — IPRATROPIUM-ALBUTEROL 0.5-2.5 (3) MG/3ML IN SOLN
3.0000 mL | Freq: Three times a day (TID) | RESPIRATORY_TRACT | Status: DC
  Administered 2014-10-13 – 2014-10-16 (×10): 3 mL via RESPIRATORY_TRACT
  Filled 2014-10-12 (×11): qty 3

## 2014-10-12 MED ORDER — ALBUTEROL SULFATE (2.5 MG/3ML) 0.083% IN NEBU
2.5000 mg | INHALATION_SOLUTION | RESPIRATORY_TRACT | Status: DC | PRN
  Administered 2014-10-17: 2.5 mg via RESPIRATORY_TRACT
  Filled 2014-10-12: qty 3

## 2014-10-12 MED ORDER — INSULIN GLARGINE 100 UNIT/ML ~~LOC~~ SOLN
35.0000 [IU] | Freq: Every day | SUBCUTANEOUS | Status: DC
  Administered 2014-10-12 – 2014-10-21 (×10): 35 [IU] via SUBCUTANEOUS
  Filled 2014-10-12 (×14): qty 0.35

## 2014-10-12 MED ORDER — CLOPIDOGREL BISULFATE 75 MG PO TABS
75.0000 mg | ORAL_TABLET | Freq: Every day | ORAL | Status: DC
  Administered 2014-10-13 – 2014-10-23 (×11): 75 mg via ORAL
  Filled 2014-10-12 (×13): qty 1

## 2014-10-12 NOTE — Telephone Encounter (Signed)
Spoke to patient, she is currently at [facility], under the care of Dr. Earlie Server.  She said that her heart rate jumped up to 190-200 and she was taken urgently to ER and has been in the hospital for 2 days.  Will follow up with Patient when she is discharged.  Patient not sure at this time when she will be discharged.

## 2014-10-12 NOTE — Progress Notes (Signed)
Patient had a 7 beat run of V tach. Asymptomatic. On call NP made aware. No new orders given.

## 2014-10-12 NOTE — Plan of Care (Signed)
Problem: Consults Goal: Atrial Arhythmia Patient Education (See Patient Education module for education specifics.) Outcome: Completed/Met Date Met:  10/12/14 Goal: Skin Care Protocol Initiated - if Braden Score 18 or less If consults are not indicated, leave blank or document N/A Outcome: Completed/Met Date Met:  10/12/14 Goal: Tobacco Cessation referral if indicated Outcome: Completed/Met Date Met:  10/12/14 Goal: Nutrition Consult-if indicated Outcome: Completed/Met Date Met:  10/12/14 Goal: Diabetes Guidelines if Diabetic/Glucose > 140 If diabetic or lab glucose is > 140 mg/dl - Initiate Diabetes/Hyperglycemia Guidelines & Document Interventions  Outcome: Completed/Met Date Met:  10/12/14

## 2014-10-12 NOTE — Progress Notes (Signed)
Echocardiogram 2D Echocardiogram has been performed.  Sabrina Adkins 10/12/2014, 9:37 AM

## 2014-10-12 NOTE — Consult Note (Signed)
[place]  Telephone:(336) [place] NOTE  Scott Fix                                MR#: [id number]  DOB: [birth year]                       CSN#: [id number]  Referring MD: Dr. Sheliah [facility]  Primary MD:  Philis Fendt, MD  Reason for Consult: Lung Cancer   LNL:[id number] Berggren is a 53 y.o. female smoker , asked to see in consultation for evaluation of lung cancer.   She had been initially seen at the ED with neck and back pain, orthopnea and non-productive cough. She also had decreased appetite with subsequent weight loss.  Imaging study at the time showed a right middle lobe nodule. A CT scan of chest on 08/12/2014 showed right middle lobe mass consistent with  bronchogenic carcinoma with ipsilateral and subcarinal lymphadenopathy, and other nodules on the right lobe as described below.   On the pre-operative admission testing, coagulation profile was abnormal and decision was made to not pursue biopsy until the cause of coagulopathy were determined. As the patient has a history of DVT, additional workup was performed, which was essentially unremarkable and was given clearance to proceed with bronchoscopy, performed on 10/14 confirming a diagnosis of Non Small cell Carcinoma, Squamous Cell Carcinoma ([id number]) Initially patient deferred referral, as she was to seek care in [place] and to be near her family.  She was to continue workup in here prior to moving,including a PET scan and MRI of the brain. However, on 11/1 she presented to the ED after noticing new, painful subpectoral subcutaneous nodules, as well as nodularities at the right scapular and upper abdominal region, initially thought to be cysts/carbuncles treated with oral antibiotics, but increasing in number and tenderness. Patient also complained of nausea, vomiting, right sided headaches, sensitivity to light, mild dizziness and abdominal pain. On admission, she  also had worsening shortness of breath, nonproductive cough, and chest wall pain. She denied any hemoptysis. No lower extremity edema.   CT Angio Chest with and without contrast on 10/11/2014 revealed marked progression of metastatic disease in the chest and upper abdomen, with a number of new subcutaneous metastatic nodules in the subcutaneous fat. New posterior left perinephric metastatic nodules also visualized. There is enlargement of the dominant right middle lobe lung carcinoma to 2.9 x 3.6 cm(2.0 x 2.2 cm on 08/2014) and a number of new metastatic nodules in both lungs. Metastatic adenopathy in the mediastinum and both axillary regions has increased in size since the prior CT. 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 No evidence of pulmonary embolism. No visualized bone metastases in the chest. No pleural effusions or pulmonary infiltrates. There is a small amount of pericardial fluid which appears more prominent compared to the prior CT.   MRI Brain with contrast on 11/1 showed  three brain metastases without significant mass effect or edema in the right cerebellum, 36mm, right parietal vertex 76mm and in the left posterior parietal region 5-6 mm containing minimal petechial blood products. No calvarial lesion is seen. Orbital lesion on the right of about 1 cm, associated with the superior rectus muscle which could represent a venous varix or metastasis.  She was to be transferred to [facility], but this was delayed due to supraventricular tachycardia now back to sinus  rhythm.        PMH:  Past Medical History  Diagnosis Date   Diabetes mellitus without complication    Anxiety    Clotting disorder     right leg DVT   Coagulopathy 09/02/2014   Hypertension    Shortness of breath     "when I am getting ready to lay down"   Pneumonia     2015   Depression    Bipolar disorder    GERD (gastroesophageal reflux disease)    Arthritis     RA   DVT (deep venous thrombosis)     >  10 years ago, was on coumadin   Cancer 10/12/2014    metastatic  lung    Surgeries:  Past Surgical History  Procedure Laterality Date   Cholecystectomy     Tubal ligation     Carpal tunnel release Right    Ablation     Video bronchoscopy with endobronchial ultrasound N/A 09/23/2014    Procedure: VIDEO BRONCHOSCOPY WITH ENDOBRONCHIAL ULTRASOUND;  Surgeon: Collene Gobble, MD;  Location: [facility];  Service: Thoracic;  Laterality: N/A;   Video bronchoscopy with endobronchial navigation N/A 09/23/2014    Procedure: VIDEO BRONCHOSCOPY WITH ENDOBRONCHIAL NAVIGATION;  Surgeon: Collene Gobble, MD;  Location: [facility];  Service: Thoracic;  Laterality: N/A;    Allergies:  Allergies  Allergen Reactions   Aspirin Hives, Shortness Of Breath, Itching and Rash    Okay to tolerate coated aspirin-MUST BE E.C.   Ibuprofen Hives, Shortness Of Breath, Itching and Rash   Penicillins Anaphylaxis and Hives    THROAT CLOSES   Orange Juice [Orange Oil]     hives   Iodine Hives, Itching and Rash   Latex Hives, Itching and Rash    Medications:   Scheduled Meds:  docusate sodium  100 mg Oral BID   gabapentin  100 mg Oral TID   heparin  5,000 Units Subcutaneous 3 times per day   insulin aspart  0-15 Units Subcutaneous TID WC   insulin glargine  20 Units Subcutaneous QHS   ipratropium-albuterol  3 mL Nebulization Q6H   levofloxacin (LEVAQUIN) IV  500 mg Intravenous Q24H   metoprolol tartrate  25 mg Oral TID   multivitamin with minerals  1 tablet Oral Daily   QUEtiapine  50 mg Oral QHS   senna  1 tablet Oral BID   Continuous Infusions:  diltiazem (CARDIZEM) infusion 5 mg/hr (10/12/14 0100)   PRN Meds:.acetaminophen **OR** acetaminophen, albuterol, ALPRAZolam, alum & mag hydroxide-simeth, bisacodyl, morphine injection, ondansetron **OR** ondansetron (ZOFRAN) IV, oxyCODONE, polyethylene glycol DGU:[id number], ALPRAZolam, alum & mag hydroxide-simeth, bisacodyl, morphine injection,  oxyCODONE, polyethylene glycol  ROS: Constitutional: Denies fevers, chills or abnormal night sweats Eyes: Denies blurriness of vision, double vision, but does have sensitivity to light on the right, with increased tears. Ears, nose, mouth, throat, and face: Denies mucositis or sore throat Respiratory: positive for non-productive cough and shortness of breath, no hemoptysis.  Cardiovascular: Positive for palpitation, chest wall discomfort . Denies lower extremity swelling Gastrointestinal: Positive for  nausea, heartburn on admission, denies change in bowel habits Skin: Multiple subcutaneous nodules subpectoral area, right shoulder and abdomen Lymphatics: Denies easy bruising. She denies adenopathy Heme: The patient numerous surgeries in the past and never had excessive bleeding. She did have history of menorrhagia requiring D&C.The patient denies any focal transfusions in the past.The patient denies any recent signs or symptoms of bleeding such as spontaneous epistaxis, hematuria or hematochezia or hemoptysis. She carries a  history of right lower extremity DVT, thought to be provoked by immobilization. She was on warfarin for almost 3 years. She was suspected to be a thrombophilia as her mother also have diagnosis of blood clots in the past. Neurological:Denies numbness, tingling or new weaknesses Musculoskeletal: back pain and neck pain  Behavioral/Psych: Mood is stable, no new changes  All other systems were reviewed with the patient and are negative.   Family History:    Family History  Problem Relation Age of Onset   Heart disease Father    Clotting disorder Mother    Rheum arthritis Mother      paternal aunt died with lung cancer    1 sister died with leukemia    1 brother died with cancer of unknown type    1 niece has lupus and leukemia    1 sister alive with breast cancer   No family history of hematological  disorders.  Social History:  reports that she quit smoking about  2 weeks ago. Her smoking use included Cigarettes. She has a 15 pack-year smoking history. She has never used smokeless tobacco. She reports that she drinks alcohol. She reports that she uses illicit drugs (Marijuana). Separated, 4 children. Lives in [place], but originally from [place]. No family members live here. She wants to return there as soon as possible.   Physical Exam   ECOG PERFORMANCE STATUS: 1 Symptomatic but completely ambulatory (Restricted in physically strenuous activity but ambulatory and able to carry out work of a light or sedentary nature. For example, light housework, office work)     Scientist, forensic Vitals:   10/12/14 0803  BP: 113/64  Pulse:   Temp: 97.8 F (36.6 C)  Resp: 15   Filed Weights   10/11/14 2029 10/12/14 0440  Weight: 250 lb 11.2 oz (113.717 kg) 250 lb 10.6 oz (113.7 kg)    GENERAL:alert, no distress and very tearful SKIN: skin color, texture, turgor are normal, multiple subcutaneous lymph nodes present in right upper abdomen, subpectoral, right scapular, and anterior chest wall, all tender.   EYES: normal, conjunctiva are pink and non-injected, sclera clear. mild right ptosis with mild right lateral deviation. pupils normal  OROPHARYNX:no exudate, no erythema and lips, buccal mucosa, and tongue normal .Poor oral hygiene.  NECK: supple, thyroid normal size, non-tender, without nodularity LYMPH:  no palpable lymphadenopathy in the cervical or inguinal. Bilateral shotty axillary lymph nodes  LUNGS: clear to auscultation and percussion with normal breathing effort HEART: regular rate & rhythm and no murmurs and no lower extremity edema ABDOMEN:abdomen obese, soft, non-tender and normal bowel sounds Musculoskeletal:no cyanosis of digits and no clubbing  PSYCH: alert & oriented x 3 with fluent speech. crying frequently.  NEURO: no focal motor/sensory deficits  CBC  Recent Labs Lab 10/11/14 1154 10/12/14 0321  WBC 19.5* 21.0*  HGB 14.4 13.6  HCT  42.8 41.1  PLT 218 204  MCV 71.1* 70.7*  MCH 23.9* 23.4*  MCHC 33.6 33.1  RDW 13.7 13.8  LYMPHSABS 3.1  --   MONOABS 1.2*  --   EOSABS 0.0  --   BASOSABS 0.0  --     Anemia panel:  No results for input(s): VITAMINB12, FOLATE, FERRITIN, TIBC, IRON, RETICCTPCT in the last 72 hours.  CMP    Recent Labs Lab 10/11/14 1154 10/12/14 0321  NA 132* 137  K 3.9 4.0  CL 90* 94*  CO2 29 33*  GLUCOSE 349* 422*  BUN 12 13  CREATININE 0.64 0.73  CALCIUM 10.2  10.0  AST 17  --   ALT 17  --   ALKPHOS 67  --   BILITOT 0.6  --         Component Value Date/Time   BILITOT 0.6 10/11/2014 1154   BILIDIR <0.2 08/25/2014 1052   IBILI NOT CALCULATED 08/25/2014 1052     No results for input(s): INR, PROTIME in the last 168 hours.  No results for input(s): DDIMER in the last 72 hours.  Imaging Studies:  Dg Chest 2 View  10/11/2014   CLINICAL DATA:  Chest pain  EXAM: CHEST  2 VIEW  COMPARISON:  CT from earlier in the same day  FINDINGS: Cardiac show remains elevated with right basilar atelectasis. A dominant right pulmonary nodule is noted as well as some smaller nodules similar to that seen on the prior CT examination. No sizable effusion is noted. No bony abnormality is seen.  IMPRESSION: Right basilar atelectasis with evidence of multiple pulmonary nodules.   Electronically Signed   By: Inez Catalina M.D.   On: 10/11/2014 14:45   Ct Angio Chest Pe W/cm &/or Wo Cm  10/11/2014  COMPARISON:  CT of the chest without contrast on 08/12/2014  FINDINGS: There has been significant progression of metastatic lung carcinoma in the chest and visualized upper abdomen. The dominant right middle lobe mass now measures approximately 2.9 x 3.6 cm compared to approximately 2.0 x 2.2 cm on the prior study. A number of metastatic pulmonary nodules are identified in both lungs. Anterior right upper lobe nodule measures 1.2 cm. Superior segment right lower lobe nodule measures 0.5 cm. Nodule abutting the lower  major fissure measures 1.3 cm. Right lower lobe peripheral nodule measures 0.7 cm. Several other smaller right lower lobe nodules present. There are at least 6 new pulmonary nodules in the left lower lobe with the largest measuring 1.1 cm. Stable 1.6 cm ground-glass opacity at the right lung apex.  Metastatic adenopathy in the mediastinum also shows significant progression since the prior CT. The dominant anterior mediastinal lymph node mass abutting both the ascending thoracic aorta and SVC now measures approximately 3.2 x 4.7 cm as compared to roughly 2.5 cm in short axis on the previous exam. Largest right peritracheal lymph node measures 1.6 cm in short axis. Subcarinal lymph node mass measures 2.6 x 3.5 cm. Prevascular lymph node mass anterior to the aortic arch shows enlargement and measures 1.5 cm in short axis. Bilateral axillary lymph nodes also show enlargement with the largest left axillary lymph node measuring 2.3 cm in short axis and the largest right axillary lymph node measuring 1.6 cm in short axis.  Multiple metastatic subcutaneous nodules are identified and are completely new since the prior examination. The largest subcutaneous nodule is in the right anterior lower costal subcutaneous fat and measures 2.8 cm in diameter. This was not present previously. A number of other smaller subcutaneous nodules are present in the left anterior upper abdominal wall fat, fat abutting the chest wall laterally on the left and the subcutaneous fat overlying the left scapula.  The visualized upper abdomen also shows evidence of bilateral adrenal metastasis which were visualized previously. The dominant right adrenal metastasis now measures approximately 3.4 x 3.6 cm compared to 2.7 cm in diameter on the prior study. One or 2 separate left adrenal metastases measure in conglomerate dimensions approximately 2.5 x 3.8 cm. New 1.2 cm nodule posterior to the left upper kidney in the perinephric fat is consistent with  metastasis.  No evidence of  pulmonary embolism. The thoracic aorta is normal in caliber and shows no evidence of dissection. Diffuse spondylosis of the thoracic spine present. There are no visualized bone metastases in the chest. No pleural effusions or pulmonary infiltrates. There is a small amount of pericardial fluid which appears more prominent compared to the prior CT.  Review of the MIP images confirms the above findings.  IMPRESSION: Marked progression of metastatic disease in the chest and upper abdomen, as detailed above. There is enlargement of the dominant right middle lobe lung carcinoma an a number of new metastatic nodules in both lungs. Metastatic adenopathy in the mediastinum and both axillary regions has increased in size since the prior CT. 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 There are a number of new subcutaneous metastatic nodules in the subcutaneous fat. New posterior left perinephric metastatic nodules also visualized. The patient presumably has not received any interval treatment since the prior CT. Recommend oncologic consultation. A formal PET scan would also be helpful to complete staging evaluation prior to initiation of any cancer treatment. There is no evidence of pulmonary embolism.  Findings were communicated directly to Dr. Canary Brim in the Emergency Department.   Electronically Signed   By: Aletta Edouard M.D.   On: 10/11/2014 14:30   Mr Jeri Cos GM Contrast  COMPARISON:  None.  FINDINGS: There are 3 metastatic lesions noted affecting the brain. Within the right lateral cerebellum, there is a 7 mm metastasis. This contains some petechial blood products. At the right parietal vertex, there is a 9 mm metastasis. This contains minimal petechial blood products. In the left posterior parietal lobe, there is a 5-6 mm metastasis.  No evidence of ischemic infarction. No evidence of chronic small vessel disease. No hydrocephalus. No extra-axial fluid collection. No calvarial  lesion is seen.  In the right orbit, there is a well-circumscribed abnormality measuring approximately 1 cm in size. This probably represents a venous varix. I cannot be certain this does not represent a metastasis to the extra-ocular muscles (superior rectus).  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 Right cerebellum. Right parietal vertex. Left posterior parietal region. Early petechial blood products in the right cerebellar and right parietal lesions.  Orbital lesion on the right associated with the superior rectus muscle. This could represent a venous varix or metastasis. This could be studied with CT or dedicated orbital MRI should differentiation be important.   Electronically Signed   By: Nelson Chimes M.D.   On: 10/11/2014 19:36   Dg Chest Port 1 View  09/23/2014   CLINICAL DATA:  Postop bronchoscopy.  EXAM: PORTABLE CHEST - 1 VIEW  COMPARISON:  08/12/2014  FINDINGS: Low lung volumes. Right lower lobe airspace opacity could be related to bronchoscopy or represent atelectasis. No pneumothorax. Previously seen mid right lung nodule not well visualized on today's study, likely due to the low volumes and airspace disease. Minimal left base atelectasis. Heart is borderline in size.  IMPRESSION: Low lung volumes with bibasilar opacities, likely atelectasis, right greater than left. No pneumothorax.   Electronically Signed   By: Rolm Baptise M.D.   On: 09/23/2014 13:10   Dg C-arm Bronchoscopy  09/23/2014   CLINICAL DATA: right middle lobe lung mass   C-ARM BRONCHOSCOPY  Fluoroscopy was utilized by the requesting physician.  No radiographic  interpretation.    Ct Chest Wo Contrast  08/12/2014  COMPARISON: None. FINDINGS: THORACIC INLET/BODY WALL: No acute abnormality. MEDIASTINUM: Normal heart size. No pericardial effusion. There is atherosclerosis  of the aorta great vessels but no acute vascular findings. Proximal LAD atherosclerotic calcification. Subcarinal  lymphadenopathy measuring 24 mm in diameter. Prevascular lymphadenopathy measuring 26 mm short axis. No visible supraclavicular adenopathy. LUNG WINDOWS: 2.2 cm pulmonary nodule in the right middle lobe with lobulated and spiculated margins. The mass has broad contact with the mildly deformed minor fissure. More inferiorly within the right middle lobe is ground-glass ill-defined nodule measuring 13 mm. There is 16 mm ground-glass nodule in the apical right upper lobe2 mm nodule in the superior segment right upper lobe, along the major fissure. No acute findings such as pneumonia, edema, effusion, or pneumothorax. UPPER ABDOMEN: 2.7 cm low-density nodule in the right adrenal gland consistent with adenoma. Two left adrenal nodules, also low-density, measuring up to 19 mm. OSSEOUS: No acute fracture. No suspicious lytic or blastic lesions. IMPRESSION: 1. 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 The nodule has broad contact with the minor fissure. There is also a 6 mm ground-glass nodule also in the right middle lobe. 2. 16 mm ground-glass nodule in the right upper lobe, suspicious for low-grade adenocarcinoma given #1. 3. 4 mm nodule along the upper right major fissure. 4. Atherosclerosis, including the coronary arteries. 5. Bilateral adrenal adenomas. Electronically Signed By: Jorje Guild M.D. On: 08/12/2014 18:38    Accession: [id number] Received: 09/23/2014 Baltazar Apo DOB: [birth year] Age: 74 Gender: F Reported: 09/24/2014 [address] Patient Ph: [phone] MRN#: [id number] [place], [place] [address] Client Acc#: Chart: Phone:  Fax: LMP: Visit#: [id number] CC: CYTOPATHOLOGY REPORT Adequacy Reason Satisfactory For Evaluation. Diagnosis BRONCHIAL LAVAGE RIGHT (SPECIMEN 4 OF 5 COLLECTED 09/23/2014) ATYPICAL CELLS PRESENT, SUSPICIOUS FOR MALIGNANCY.   Diagnosis Lung, biopsy, right middle  lobe [id number] - MALIGNANT CELLS PRESENT, MOST CONSISTENT WITH SQUAMOUS CELL CARCINOMA. Vonna Kotyk KISH MD Pathologist, Electronic Signature (Case signed 09/25/2014)   A/P: 53 y.o. female with New diagnosis of  Metastatic Non-Small Cell Lung Carcinoma, Squamous Cell Carcinoma  A CT scan of chest on 08/12/2014 showed right middle lobe mass consistent with  bronchogenic carcinoma with ipsilateral and subcarinal lymphadenopathy, and other nodules on the right lobe. workup was in progress, which included hematological clearance as patient carried a prior history of DVT Bronchoscopy was finally performed on 10/14,  confirming Non-Small Cell Lung Carcinoma, Squamous Cell Carcinoma She was to continue treatment in [place] to be close with her family.  Patient was admitted on 11/1 after the patient presented with multiple painful subpectoral subcutaneous nodules, as well as nodularities at the shoulder and upper abdominal region  New CT of the chest on 11/1 confirmed marked progression of metastatic disease in the chest and upper abdomen, with enlargement of the dominant right middle lobe lung carcinoma an a number of new metastatic nodules in both lungs. Metastatic adenopathy in the mediastinum and both axillary regions and bilateral adrenal metastases have increased in size. New subcutaneous metastatic nodules in the subcutaneous fat were confirmed. New posterior left perinephric metastatic nodules also visualized. MRI of the brain shows 3 metastatic deposits in the right lateral cerebellum,right parietal vertex, and left posterior parietal region, as well as a possible right orbital area lesion.   PET scan was planned as outpatient, now delayed to current hospitalization. May need to proceed with CT of the abdomen and pelvis while in hospital to evaluate for metastatic disease.  She will be transferred to [facility] when stable from the cardiac standpoint.  Radiation Oncology evaluation recommended, unless  patient is to leave hospital to  start treatment in [place].  History of DVT The cause of coagulopathy is unknown. Recent workup tests were unremarkable. On Heparin per Pharmacy  Leukocytosis Likely reactive, as well as due to the use of tobacco,pain, inflammation and recent UTI diagnosis.  Recent smear on 11/1 unremarkable. Monitor for now. Continue antibiotics, IVFs.    Full Code   Elease Hashimoto 10/12/2014 9:07 AM  ADDENDUM: Hematology/Oncology Attending: The patient is seen and examined. I agree with the above note. This is a very pleasant 53 years old African-American female with history of hypertension, diabetes mellitus, depression, deep venous thrombosis and long history of smoking. Few weeks ago the patient presented to the emergency Department complaining of neck and back pain as well as shortness of breath with nonproductive cough. During her evaluation CT angiogram of the chest was performed on 08/12/2014 and it showed 2.2 cm pulmonary nodule in the right middle lobe with lobulated and speculated margins. There was also evidence for subcarinal lymphadenopathy measuring 2.4 cm in diameter and the prevascular lymphadenopathy measuring 2.6 cm in diameter. Imaging of the upper abdomen showed 2.7 cm low-density nodule in the right adrenal gland consistent with adenoma as well as 2 left adrenal nodules. The patient was referred to Dr. Lamonte Sakai and on 09/23/2014 she underwent flexible video fiberoptic bronchoscopy with endobronchial ultrasound and biopsies. The final pathology (Accession: [id number]) showed malignant cells consistent with squamous cell carcinoma. The patient was planning to move back to [place] close to her family to receive treatment but her condition was getting worse. She presented with multiple painful subcutaneous nodules in several areas in her body more on the left side of the chest, anterior abdomen as well as shoulder area. Repeat CT angiogram of the chest  performed on 10/11/2014 showed marked progression of the metastatic disease in the chest and upper abdomen. There was enlargement of the dominant right middle lobe lung carcinoma as well as a number of new metastatic nodules in both lungs. There was also metastatic adenopathy in the mediastinum and both exam yearly lesions that had increased in size since the prior CT scan of the chest. The bilateral adrenal masses increased in size and concerning for metastatic disease. There was also a number of new subcutaneous metastatic nodules in the subcutaneous fat. There is also new posterior left perinephric metastatic nodules. MRI of the brain performed yesterday showed CC brain metastasis without significant mass effect or edema located in the right cerebellum, right parietal vertex and left posterior parietal region.  I was consulted to see the patient today for evaluation and recommendation regarding treatment of her condition. When seen today she continues to complain of increasing fatigue and weakness as well as shortness of breath and the painful subcutaneous nodules. She also has some visual changes especially on the left eye with mild headache. She denied having any significant nausea or vomiting, no fever or chills. Her grandmother had a history of lung cancer with subcutaneous nodules. No malignancy in her father or mother. The patient is originally from [place] and has been in [place] for the last 3 years but she has no family members in the area and all her children and relatives are in [place]. She would like to go back to there soon but this may take her few weeks. She has a history of smoking for around 30 years. No history of alcohol or drug abuse. 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 I had a lengthy discussion with the patient today about her current disease stage, prognosis and treatment options. I explained to the patient that she has incurable condition and the treatment will be of palliative nature. Unfortunately she has very poor prognosis and survival without treatment is probably in the range of few months. I gave her the option of systemic therapy versus palliative care and hospice referral. The patient is interested in treatment. We will need to transfer her to [facility] for consideration of palliative treatment to the brain lesions before starting systemic chemotherapy. I may consider her for treatment with carboplatin and paclitaxel or carboplatin and Abraxane once treatment for the brain lesions is completed. She may need to start a small dose of Decadron for the brain lesion and she will need close monitoring of her blood glucose during this treatment because of her history of diabetes. The patient would also benefit from having a PET scan to complete the staging workup of her disease but unfortunately this will not be done until she is discharged from the hospital. I will consult radiation oncology to evaluate the patient for consideration of treatment of her brain lesions. Once the patient is ready to move to [place], we will contact a local physician in that area to resume her care. I gave the patient the time to ask questions and answered them completely to her satisfaction. Thank you so much for allowing me to participate in the care of Mrs. Lattner, I will continue to follow up the patient with you and assist in her management on as-needed basis.

## 2014-10-12 NOTE — Progress Notes (Signed)
Inpatient Diabetes Program Recommendations  AACE/ADA: New Consensus Statement on Inpatient Glycemic Control (2013)  Target Ranges:  Prepandial:   less than 140 mg/dL      Peak postprandial:   less than 180 mg/dL (1-2 hours)      Critically ill patients:  140 - 180 mg/dL     Results for HEYDY, MONTILLA (MRN [id number]) as of 10/12/2014 14:23  Ref. Range 10/12/2014 04:58 10/12/2014 08:12 10/12/2014 11:45  Glucose-Capillary Latest Range: 70-99 mg/dL 417 (H) 363 (H) 305 (H)    Admitted with new diagnosis of Metastatic lung cancer, SVT, UTI.  History of DM, HTN.   Home DM Meds: Lantus 35 units QHS       Glipizide 10 mg bid       Metformin 1000 mg bid       Actos 30 mg daily   Current DM Meds: Lantus 35 units QHS         Novolog Moderate SSI tid ac + HS    **Note CBG this AM at 5am was 417 mg/dl.  Patient was given 10 units Novolog.  **Patient was given 20 units Lantus last PM at 1030pm.  **Note Lantus dose increased to home dose of 35 units QHS (will get increased dose tonight).  Agree.  A1c has also been ordered.    Will follow Wyn Quaker RN, MSN, CDE Diabetes Coordinator Inpatient Diabetes Program Team Pager: [phone] (8a-10p)

## 2014-10-12 NOTE — Progress Notes (Signed)
Patient ID: Sabrina Adkins, female   DOB: 15-Mar-1961, 53 y.o.   MRN: [id number]    Subjective:  Denies SSCP, palpitations or Dyspnea Worried about lungs   Objective:  Filed Vitals:   10/12/14 0309 10/12/14 0343 10/12/14 0440 10/12/14 0803  BP:   101/72 113/64  Pulse:   95   Temp: 98.9 F (37.2 C)  97.6 F (36.4 C)   TempSrc: Oral  Oral   Resp:   18 15  Height:      Weight:   113.7 kg (250 lb 10.6 oz)   SpO2:  97% 97% 100%    Intake/Output from previous day:  Intake/Output Summary (Last 24 hours) at 10/12/14 5638 Last data filed at 10/11/14 1418  Gross per 24 hour  Intake    500 ml  Output      0 ml  Net    500 ml    Physical Exam: Affect appropriate Obese black female  HEENT: normal Neck supple with no adenopathy JVP normal no bruits no thyromegaly Lungs clear with no wheezing and good diaphragmatic motion Heart:  S1/S2 no murmur, no rub, gallop or click PMI normal Abdomen: benighn, BS positve, no tenderness, no AAA no bruit.  No HSM or HJR Distal pulses intact with no bruits No edema Neuro non-focal Skin warm and dry No muscular weakness   Lab Results: Basic Metabolic Panel:  Recent Labs  10/11/14 1154 10/12/14 0321  NA 132* 137  K 3.9 4.0  CL 90* 94*  CO2 29 33*  GLUCOSE 349* 422*  BUN 12 13  CREATININE 0.64 0.73  CALCIUM 10.2 10.0   Liver Function Tests:  Recent Labs  10/11/14 1154  AST 17  ALT 17  ALKPHOS 67  BILITOT 0.6  PROT 7.2  ALBUMIN 3.6   CBC:  Recent Labs  10/11/14 1154 10/12/14 0321  WBC 19.5* 21.0*  NEUTROABS 15.2*  --   HGB 14.4 13.6  HCT 42.8 41.1  MCV 71.1* 70.7*  PLT 218 204    Thyroid Function Tests:  Recent Labs  10/11/14 2030  TSH 1.290    Imaging: Dg Chest 2 View  10/11/2014   CLINICAL DATA:  Chest pain  EXAM: CHEST  2 VIEW  COMPARISON:  CT from earlier in the same day  FINDINGS: Cardiac show remains elevated with right basilar atelectasis. A dominant right pulmonary nodule is noted as well as  some smaller nodules similar to that seen on the prior CT examination. No sizable effusion is noted. No bony abnormality is seen.  IMPRESSION: Right basilar atelectasis with evidence of multiple pulmonary nodules.   Electronically Signed   By: Inez Catalina M.D.   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 Now with shortness of breath. History of DVT.  EXAM: CT ANGIOGRAPHY CHEST WITH CONTRAST  TECHNIQUE: Multidetector CT imaging of the chest was performed using the standard protocol during bolus administration of intravenous contrast. Multiplanar CT image reconstructions and MIPs were obtained to evaluate the vascular anatomy.  CONTRAST:  65mL OMNIPAQUE IOHEXOL 350 MG/ML SOLN  COMPARISON:  CT of the chest without contrast on 08/12/2014  FINDINGS: There has been significant progression of metastatic lung carcinoma in the chest and visualized upper abdomen. The dominant right middle lobe mass now measures approximately 2.9 x 3.6 cm compared to approximately 2.0 x  2.2 cm on the prior study. A number of metastatic pulmonary nodules are identified in both lungs. Anterior right upper lobe nodule measures 1.2 cm. Superior segment right lower lobe nodule measures 0.5 cm. Nodule abutting the lower major fissure measures 1.3 cm. Right lower lobe peripheral nodule measures 0.7 cm. Several other smaller right lower lobe nodules present. There are at least 6 new pulmonary nodules in the left lower lobe with the largest measuring 1.1 cm. Stable 1.6 cm ground-glass opacity at the right lung apex.  Metastatic adenopathy in the mediastinum also shows significant progression since the prior CT. The dominant anterior mediastinal lymph node mass abutting both the ascending thoracic aorta and SVC now measures approximately 3.2 x 4.7 cm as compared to  roughly 2.5 cm in short axis on the previous exam. Largest right peritracheal lymph node measures 1.6 cm in short axis. Subcarinal lymph node mass measures 2.6 x 3.5 cm. Prevascular lymph node mass anterior to the aortic arch shows enlargement and measures 1.5 cm in short axis. Bilateral axillary lymph nodes also show enlargement with the largest left axillary lymph node measuring 2.3 cm in short axis and the largest right axillary lymph node measuring 1.6 cm in short axis.  Multiple metastatic subcutaneous nodules are identified and are completely new since the prior examination. The largest subcutaneous nodule is in the right anterior lower costal subcutaneous fat and measures 2.8 cm in diameter. This was not present previously. A number of other smaller subcutaneous nodules are present in the left anterior upper abdominal wall fat, fat abutting the chest wall laterally on the left and the subcutaneous fat overlying the left scapula.  The visualized upper abdomen also shows evidence of bilateral adrenal metastasis which were visualized previously. The dominant right adrenal metastasis now measures approximately 3.4 x 3.6 cm compared to 2.7 cm in diameter on the prior study. One or 2 separate left adrenal metastases measure in conglomerate dimensions approximately 2.5 x 3.8 cm. New 1.2 cm nodule posterior to the left upper kidney in the perinephric fat is consistent with metastasis.  No evidence of pulmonary embolism. The thoracic aorta is normal in caliber and shows no evidence of dissection. Diffuse spondylosis of the thoracic spine present. There are no visualized bone metastases in the chest. No pleural effusions or pulmonary infiltrates. There is a small amount of pericardial fluid which appears more prominent compared to the prior CT.  Review of the MIP images confirms the above findings.  IMPRESSION: Marked progression of metastatic disease in the chest and upper abdomen, as detailed above. There is  enlargement of the dominant right middle lobe lung carcinoma an a number of new metastatic nodules in both lungs. Metastatic adenopathy in the mediastinum and both axillary regions has increased in size since the prior CT. 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 There are a number of new subcutaneous metastatic nodules in the subcutaneous fat. New posterior left perinephric metastatic nodules also visualized. The patient presumably has not received any interval treatment since the prior CT. Recommend oncologic consultation. A formal PET scan would also be helpful to complete staging evaluation prior to initiation of any cancer treatment. There is no evidence of pulmonary embolism.  Findings were communicated directly to Dr. Canary Brim in the Emergency Department.   Electronically Signed   By: Aletta Edouard M.D.   On: 10/11/2014 14:30   Mr Jeri Cos RD Contrast  10/11/2014   CLINICAL DATA:  Initial encounter. New diagnosis lung cancer. Right-sided headache, 3 days duration.  Staging.  EXAM: MRI HEAD WITHOUT AND WITH CONTRAST  TECHNIQUE: Multiplanar, multiecho pulse sequences of the brain and surrounding structures were obtained without and with intravenous contrast.  CONTRAST:  49mL MULTIHANCE GADOBENATE DIMEGLUMINE 529 MG/ML IV SOLN  COMPARISON:  None.  FINDINGS: There are 3 metastatic lesions noted affecting the brain. Within the right lateral cerebellum, there is a 7 mm metastasis. This contains some petechial blood products. At the right parietal vertex, there is a 9 mm metastasis. This contains minimal petechial blood products. In the left posterior parietal lobe, there is a 5-6 mm metastasis.  No evidence of ischemic infarction. No evidence of chronic small vessel disease. No hydrocephalus. No extra-axial fluid collection. No calvarial lesion is seen.  In the right orbit, there is a well-circumscribed abnormality measuring approximately 1 cm in size. This probably represents a venous varix. I cannot  be certain this does not represent a metastasis to the extra-ocular muscles (superior rectus).  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 Right cerebellum. Right parietal vertex. Left posterior parietal region. Early petechial blood products in the right cerebellar and right parietal lesions.  Orbital lesion on the right associated with the superior rectus muscle. This could represent a venous varix or metastasis. This could be studied with CT or dedicated orbital MRI should differentiation be important.   Electronically Signed   By: Nelson Chimes M.D.   On: 10/11/2014 19:36    Cardiac Studies:  ECG:  Sinus tachycardia low voltage no acute changes    Telemetry:  PAF  Currently sinus rhythm sinus tachycardia   Echo: pending   Medications:   . diltiazem  180 mg Oral Daily  . diltiazem  20 mg Intravenous Once  . docusate sodium  100 mg Oral BID  . gabapentin  100 mg Oral TID  . heparin  5,000 Units Subcutaneous 3 times per day  . insulin aspart  0-15 Units Subcutaneous TID WC  . insulin glargine  20 Units Subcutaneous QHS  . ipratropium-albuterol  3 mL Nebulization Q6H  . ipratropium-albuterol      . levofloxacin (LEVAQUIN) IV  500 mg Intravenous Q24H  . multivitamin with minerals  1 tablet Oral Daily  . QUEtiapine  50 mg Oral QHS  . senna  1 tablet Oral BID     . diltiazem (CARDIZEM) infusion 5 mg/hr (10/12/14 0100)    Assessment/Plan:  PAF:  D/C oral/iv cardizem  Rx with q 8 beta blocker echo pending  Not anemic and TSH normal   DM:  Continue insulin coverage Pulm:  Consider having Dr Lamonte Sakai see in hospital as he recently did her bronchoscopy Elevated WBC:  On levaquin plan per primary service   Jenkins Rouge 10/12/2014, 8:06 AM

## 2014-10-12 NOTE — Plan of Care (Signed)
Problem: Phase I Progression Outcomes Goal: Ventricular heart rate < 120/min Outcome: Completed/Met Date Met:  10/12/14

## 2014-10-12 NOTE — Progress Notes (Signed)
Pt arrived to [facility] via [facility] from [facility]  Pt in stable condition, no complaints at this time.  Oriented to unit and staff.  Will monitor.  Coolidge Breeze, RN 10/12/2014

## 2014-10-12 NOTE — Plan of Care (Signed)
Problem: Phase I Progression Outcomes Goal: Heart rate or rhythm control medication Outcome: Completed/Met Date Met:  10/12/14 Goal: Pain controlled with appropriate interventions Outcome: Completed/Met Date Met:  10/12/14 Goal: Initial discharge plan identified Outcome: Completed/Met Date Met:  10/12/14 Goal: Hemodynamically stable Outcome: Completed/Met Date Met:  10/12/14

## 2014-10-12 NOTE — Progress Notes (Signed)
CARE MANAGEMENT NOTE 10/12/2014  Patient:  Sabrina Adkins, Sabrina Adkins   Account Number:  [id number]  Date Initiated:  10/12/2014  Documentation initiated by:  Dessa Phi  Subjective/Objective Assessment:   53 Y/O F ADMITTED W/PAINFUL NODULES UNDER BREASTS.NEW DX:MET LUNG CA.TACHYCARDIA.     Action/Plan:   FROM HOME.   Anticipated DC Date:  10/16/2014   Anticipated DC Plan:  [place]  CM consult      Choice offered to / List presented to:             Status of service:  In process, will continue to follow Medicare Important Message given?   (If response is "NO", the following Medicare IM given date fields will be blank) Date Medicare IM given:   Medicare IM given by:   Date Additional Medicare IM given:   Additional Medicare IM given by:    Discharge Disposition:    Per UR Regulation:  Reviewed for med. necessity/level of care/duration of stay  If discussed at [place] of Stay Meetings, dates discussed:    Comments:  10/12/14 Rashaunda Rahl RN,BSN NCM [place].NO ANTICIPATED D/C NEEDS.

## 2014-10-12 NOTE — Telephone Encounter (Signed)
There's probably no benefit to prescribing anything else until she can be see to have the lesions drained. She needs to see sgy to accomplish this. If she feels that she needs to be seen urgently then she could go to urgent care or to the ER

## 2014-10-12 NOTE — Progress Notes (Addendum)
[facility] PROGRESS NOTE  Sabrina Adkins YHC:[id number] DOB: [birth year] DOA: 10/11/2014 PCP: Philis Fendt, MD  Assessment/Plan: Active Problems:   Metastatic lung cancer (metastasis from lung to other site)   DM (diabetes mellitus)   HTN (hypertension)   UTI (lower urinary tract infection)   Supraventricular tachycardia     New diagnosis of Metastatic Non-Small Cell Lung Carcinoma, Squamous Cell Carcinoma Seen by oncology, Dr. Julien Nordmann on call ,Dr. Jonette Eva requested the patient to be admitted for further workup and possible need for treatment including chemo and radiation,  MRI of the brain shows 3 metastatic deposits in the right lateral cerebellum,right parietal vertex, and left posterior parietal region, as well as a possible right orbital area lesion.  Patient seen by oncology PA, recommend PET scan outpatient CT scan of the abdomen and pelvis to evaluate for metastatic disease.ordered Oncology recommends transfer to [facility] Radiation oncology consultation per onc PA   Supraventricular tachycardia -patient denies any cardiac history, EKG showing supraventricular cardia with AV nodal reentry, she had A. Fib on telemetry as well, but not captured on EKG, responded to IV Cardizem,currently back to normal sinus rhythm, with heart rate in  100's on Cardizem drip and by mouth metoprolol Cardiology consulted, recommend Plavix, hold off on anticoagulation given possibility of invasive procedures Thyroid function within normal limits 2-D echo pending    UTI continue levofloxacin  Hypertension -Blood pressure on the lower side, hold home medication.  Diabetes mellitus -Hold all oral hypoglycemic agents Hemoglobin A1c pending- Continue Lantus, increased dose to 35, continue sliding scale insulin   Code Status: full Family Communication: family updated about patient's clinical progress Disposition Plan:  [facility]   Brief narrative: 53 y.o. female w/ PMHx significant for DM, HTN, who presented to [facility] on 10/11/2014 with complaints of skin nodules. Recent diagnosis of lung cancer (SCC) with plans of seen oncology soon. Presenting with multiple subcutaneous nodules on chest.  She reports that she has no diagnosed arrhythmia but has been told by her PCP that she has some arrhythmia (sounds more like PVCs but was told to switch to decaf coffee). Over that last week, she has been under a significant amount of stress. She reports concurrently that she has been having episodic palpitations, similar to what occurred in the ER today. No chest pain or SOB with them; just the uncomfortable palpitations. Knows what afib is as her ex husband carries the diagnosis. Reports a remote history of a stress test in [place] but it was for screening purposes she states. No echo.  During workup for nodules and tachycardia, underwent CT scan with demonstrated progression of the nodules. Concern that they may represent metastatic dz.  While in ER, sinus tachycardia increased in rate to rapid afib. Given 10 mg cardizem x 2 and returned to sinus rhythm.  Review of EKGs in EPIC, show sinus tachycardia, RAD (errant lead placement?), LAE and PRWP, unchanged from prior except for axis.  Review of telemetry shows paroxysmal runs of atrial fibrillation (fast and irregular) with 2 episodes of brief (~10 beats) of wide complex is most consistent with aberrency (rate is faster).   Consultants:  Cardiology  Oncology  Procedures:  none  Antibiotics:  Levaquin  HPI/Subjective: Denies any chest pain shortness of breath, just very tired, denies any headache nausea vomiting blurry vision  Objective: Filed Vitals:   10/12/14 0440 10/12/14 0803 10/12/14 0819 10/12/14 1143  BP: 101/72 113/64  106/68  Pulse: 95  79  Temp: 97.6 F (36.4 C) 97.8 F (36.6 C)  98 F (36.7 C)  TempSrc: Oral Oral  Oral  Resp: 18  15  14   Height:      Weight: 113.7 kg (250 lb 10.6 oz)     SpO2: 97% 100% 100% 93%    Intake/Output Summary (Last 24 hours) at 10/12/14 1355 Last data filed at 10/12/14 0900  Gross per 24 hour  Intake    510 ml  Output      0 ml  Net    510 ml    Exam:  General: alert & oriented x 3 In NAD  Cardiovascular: RRR, nl S1 s2  Respiratory: Decreased breath sounds at the bases, scattered rhonchi, no crackles  Abdomen: soft +BS NT/ND, no masses palpable  Extremities: No cyanosis and no edema      Data Reviewed: Basic Metabolic Panel:  Recent Labs Lab 10/11/14 1154 10/12/14 0321  NA 132* 137  K 3.9 4.0  CL 90* 94*  CO2 29 33*  GLUCOSE 349* 422*  BUN 12 13  CREATININE 0.64 0.73  CALCIUM 10.2 10.0    Liver Function Tests:  Recent Labs Lab 10/11/14 1154  AST 17  ALT 17  ALKPHOS 67  BILITOT 0.6  PROT 7.2  ALBUMIN 3.6   No results for input(s): LIPASE, AMYLASE in the last 168 hours. No results for input(s): AMMONIA in the last 168 hours.  CBC:  Recent Labs Lab 10/11/14 1154 10/12/14 0321  WBC 19.5* 21.0*  NEUTROABS 15.2*  --   HGB 14.4 13.6  HCT 42.8 41.1  MCV 71.1* 70.7*  PLT 218 204    Cardiac Enzymes: No results for input(s): CKTOTAL, CKMB, CKMBINDEX, TROPONINI in the last 168 hours. BNP (last 3 results) No results for input(s): PROBNP in the last 8760 hours.   CBG:  Recent Labs Lab 10/11/14 1612 10/11/14 2041 10/12/14 0458 10/12/14 0812 10/12/14 1145  GLUCAP 247* 390* 417* 363* 305*    Recent Results (from the past 240 hour(s))  MRSA PCR Screening     Status: None   Collection Time: 10/12/14 10:29 AM  Result Value Ref Range Status   MRSA by PCR NEGATIVE NEGATIVE Final    Comment:        The GeneXpert MRSA Assay (FDA approved for NASAL specimens only), is one component of a comprehensive MRSA colonization surveillance program. It is not intended to diagnose MRSA infection nor to guide or monitor treatment for MRSA  infections.      Studies: Dg Chest 2 View  10/11/2014   CLINICAL DATA:  Chest pain  EXAM: CHEST  2 VIEW  COMPARISON:  CT from earlier in the same day  FINDINGS: Cardiac show remains elevated with right basilar atelectasis. A dominant right pulmonary nodule is noted as well as some smaller nodules similar to that seen on the prior CT examination. No sizable effusion is noted. No bony abnormality is seen.  IMPRESSION: Right basilar atelectasis with evidence of multiple pulmonary nodules.   Electronically Signed   By: Inez Catalina M.D.   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 Now with shortness of breath. History of DVT.  EXAM: CT ANGIOGRAPHY CHEST WITH CONTRAST  TECHNIQUE: Multidetector CT imaging of the chest was performed using the standard protocol during bolus administration of  intravenous contrast. Multiplanar CT image reconstructions and MIPs were obtained to evaluate the vascular anatomy.  CONTRAST:  14mL OMNIPAQUE IOHEXOL 350 MG/ML SOLN  COMPARISON:  CT of the chest without contrast on 08/12/2014  FINDINGS: There has been significant progression of metastatic lung carcinoma in the chest and visualized upper abdomen. The dominant right middle lobe mass now measures approximately 2.9 x 3.6 cm compared to approximately 2.0 x 2.2 cm on the prior study. A number of metastatic pulmonary nodules are identified in both lungs. Anterior right upper lobe nodule measures 1.2 cm. Superior segment right lower lobe nodule measures 0.5 cm. Nodule abutting the lower major fissure measures 1.3 cm. Right lower lobe peripheral nodule measures 0.7 cm. Several other smaller right lower lobe nodules present. There are at least 6 new pulmonary nodules in the left lower lobe with the largest measuring 1.1 cm. Stable 1.6 cm ground-glass  opacity at the right lung apex.  Metastatic adenopathy in the mediastinum also shows significant progression since the prior CT. The dominant anterior mediastinal lymph node mass abutting both the ascending thoracic aorta and SVC now measures approximately 3.2 x 4.7 cm as compared to roughly 2.5 cm in short axis on the previous exam. Largest right peritracheal lymph node measures 1.6 cm in short axis. Subcarinal lymph node mass measures 2.6 x 3.5 cm. Prevascular lymph node mass anterior to the aortic arch shows enlargement and measures 1.5 cm in short axis. Bilateral axillary lymph nodes also show enlargement with the largest left axillary lymph node measuring 2.3 cm in short axis and the largest right axillary lymph node measuring 1.6 cm in short axis.  Multiple metastatic subcutaneous nodules are identified and are completely new since the prior examination. The largest subcutaneous nodule is in the right anterior lower costal subcutaneous fat and measures 2.8 cm in diameter. This was not present previously. A number of other smaller subcutaneous nodules are present in the left anterior upper abdominal wall fat, fat abutting the chest wall laterally on the left and the subcutaneous fat overlying the left scapula.  The visualized upper abdomen also shows evidence of bilateral adrenal metastasis which were visualized previously. The dominant right adrenal metastasis now measures approximately 3.4 x 3.6 cm compared to 2.7 cm in diameter on the prior study. One or 2 separate left adrenal metastases measure in conglomerate dimensions approximately 2.5 x 3.8 cm. New 1.2 cm nodule posterior to the left upper kidney in the perinephric fat is consistent with metastasis.  No evidence of pulmonary embolism. The thoracic aorta is normal in caliber and shows no evidence of dissection. Diffuse spondylosis of the thoracic spine present. There are no visualized bone metastases in the chest. No pleural effusions or pulmonary  infiltrates. There is a small amount of pericardial fluid which appears more prominent compared to the prior CT.  Review of the MIP images confirms the above findings.  IMPRESSION: Marked progression of metastatic disease in the chest and upper abdomen, as detailed above. There is enlargement of the dominant right middle lobe lung carcinoma an a number of new metastatic nodules in both lungs. Metastatic adenopathy in the mediastinum and both axillary regions has increased in size since the prior CT. 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 There are a number of new subcutaneous metastatic nodules in the subcutaneous fat. New posterior left perinephric metastatic nodules also visualized. The patient presumably has not received any interval treatment since the prior CT. Recommend oncologic consultation. A formal PET scan would also be helpful  to complete staging evaluation prior to initiation of any cancer treatment. There is no evidence of pulmonary embolism.  Findings were communicated directly to Dr. Canary Brim in the Emergency Department.   Electronically Signed   By: Aletta Edouard M.D.   On: 10/11/2014 14:30   Mr Jeri Cos YW Contrast  10/11/2014   CLINICAL DATA:  Initial encounter. New diagnosis lung cancer. Right-sided headache, 3 days duration. Staging.  EXAM: MRI HEAD WITHOUT AND WITH CONTRAST  TECHNIQUE: Multiplanar, multiecho pulse sequences of the brain and surrounding structures were obtained without and with intravenous contrast.  CONTRAST:  29mL MULTIHANCE GADOBENATE DIMEGLUMINE 529 MG/ML IV SOLN  COMPARISON:  None.  FINDINGS: There are 3 metastatic lesions noted affecting the brain. Within the right lateral cerebellum, there is a 7 mm metastasis. This contains some petechial blood products. At the right parietal vertex, there is a 9 mm metastasis. This contains minimal petechial blood products. In the left posterior parietal lobe, there is a 5-6 mm metastasis.  No evidence of ischemic  infarction. No evidence of chronic small vessel disease. No hydrocephalus. No extra-axial fluid collection. No calvarial lesion is seen.  In the right orbit, there is a well-circumscribed abnormality measuring approximately 1 cm in size. This probably represents a venous varix. I cannot be certain this does not represent a metastasis to the extra-ocular muscles (superior rectus).  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 Right cerebellum. Right parietal vertex. Left posterior parietal region. Early petechial blood products in the right cerebellar and right parietal lesions.  Orbital lesion on the right associated with the superior rectus muscle. This could represent a venous varix or metastasis. This could be studied with CT or dedicated orbital MRI should differentiation be important.   Electronically Signed   By: Nelson Chimes M.D.   On: 10/11/2014 19:36   Dg Chest Port 1 View  09/23/2014   CLINICAL DATA:  Postop bronchoscopy.  EXAM: PORTABLE CHEST - 1 VIEW  COMPARISON:  08/12/2014  FINDINGS: Low lung volumes. Right lower lobe airspace opacity could be related to bronchoscopy or represent atelectasis. No pneumothorax. Previously seen mid right lung nodule not well visualized on today's study, likely due to the low volumes and airspace disease. Minimal left base atelectasis. Heart is borderline in size.  IMPRESSION: Low lung volumes with bibasilar opacities, likely atelectasis, right greater than left. No pneumothorax.   Electronically Signed   By: Rolm Baptise M.D.   On: 09/23/2014 13:10   Dg C-arm Bronchoscopy  09/23/2014   CLINICAL DATA: right middle lobe lung mass   C-ARM BRONCHOSCOPY  Fluoroscopy was utilized by the requesting physician.  No radiographic  interpretation.     Scheduled Meds: . clopidogrel  75 mg Oral Daily  . docusate sodium  100 mg Oral BID  . gabapentin  100 mg Oral TID  . heparin  5,000 Units Subcutaneous 3 times per day  . insulin aspart   0-15 Units Subcutaneous TID WC  . insulin glargine  20 Units Subcutaneous QHS  . ipratropium-albuterol  3 mL Nebulization Q6H  . levofloxacin (LEVAQUIN) IV  500 mg Intravenous Q24H  . metoprolol tartrate  25 mg Oral TID  . multivitamin with minerals  1 tablet Oral Daily  . QUEtiapine  50 mg Oral QHS  . senna  1 tablet Oral BID   Continuous Infusions: . diltiazem (CARDIZEM) infusion 5 mg/hr (10/12/14 0100)    Active Problems:   Metastatic lung cancer (metastasis from lung to other site)  DM (diabetes mellitus)   HTN (hypertension)   UTI (lower urinary tract infection)   Supraventricular tachycardia    Time spent: 40 minutes   [facility] Pager [phone]. If 7PM-7AM, please contact night-coverage at [email or web address], password [facility] 10/12/2014, 1:55 PM  LOS: 1 day

## 2014-10-13 ENCOUNTER — Ambulatory Visit: Payer: Medicaid Other | Admitting: Radiation Oncology

## 2014-10-13 ENCOUNTER — Inpatient Hospital Stay (HOSPITAL_COMMUNITY): Payer: Medicaid Other

## 2014-10-13 DIAGNOSIS — I472 Ventricular tachycardia, unspecified: Secondary | ICD-10-CM

## 2014-10-13 LAB — GLUCOSE, CAPILLARY
GLUCOSE-CAPILLARY: 256 mg/dL — AB (ref 70–99)
Glucose-Capillary: 317 mg/dL — ABNORMAL HIGH (ref 70–99)
Glucose-Capillary: 320 mg/dL — ABNORMAL HIGH (ref 70–99)
Glucose-Capillary: 278 mg/dL — ABNORMAL HIGH (ref 70–99)

## 2014-10-13 LAB — URINE CULTURE: Colony Count: 75000

## 2014-10-13 MED ORDER — DILTIAZEM HCL 30 MG PO TABS
60.0000 mg | ORAL_TABLET | Freq: Four times a day (QID) | ORAL | Status: DC
  Administered 2014-10-13 – 2014-10-14 (×5): 60 mg via ORAL
  Filled 2014-10-13 (×5): qty 2

## 2014-10-13 NOTE — Care Management Note (Addendum)
    Page 1 of 2   10/26/2014     3:21:03 PM CARE MANAGEMENT NOTE 10/26/2014  Patient:  Sabrina Adkins, Sabrina Adkins   Account Number:  [id number]  Date Initiated:  10/12/2014  Documentation initiated by:  Dessa Phi  Subjective/Objective Assessment:   53 Y/O F ADMITTED W/PAINFUL NODULES UNDER BREASTS.NEW DX:MET LUNG CA.TACHYCARDIA.     Action/Plan:   FROM HOME.   Anticipated DC Date:  10/26/2014   Anticipated DC Plan:  [facility]  CM consult  [facility]      Choice offered to / List presented to:  C-1 Patient   DME arranged  [facility]      DME agency  [facility].        Status of service:  Completed, signed off Medicare Important Message given?   (If response is "NO", the following Medicare IM given date fields will be blank) Date Medicare IM given:   Medicare IM given by:   Date Additional Medicare IM given:   Additional Medicare IM given by:    Discharge Disposition:  HOME/SELF CARE  Per UR Regulation:  Reviewed for med. necessity/level of care/duration of stay  If discussed at [place] of Stay Meetings, dates discussed:   10/20/2014  10/22/2014    Comments:  10/26/14 Boots Mcglown RN,BSN NCM [phone] PATIENT TRAVELING TO [place] TOMORROW PER DTR.NO HOME CARE ORDERED SINCE MOVING.DTR WILL ESTABLISH ALL SERVICES ONCE THERE.[facility] DME REP AWARE OF ALL DME ORDERS.NO FURTHER D/C NEEDS.  10/22/14 Ferne Ellingwood RN,BSN NCM [phone] AFIB-LOPRESSOR IV X2.HYPOTENSION-BOLUS.[facility] FOLLOWING FOR HHC.AWAIT FINAL HHC ORDERS.  10/20/14 Santoria Chason RN,BSN NCM [phone] PROVIDED DTR W/RESOURCES FOR AIR TRAVEL.OT-RECOMMENDED W/C,RW,3N1.AWAIT FINAL HOME DME ORDER.TC [facility] DME REP [place] INFORMED HER OF REFERRAL.  AFLUTTER,N/V.D/C PLANS TO STAY LOCAL IN [place]-[facility] KRISTEN CHOSEN-AWARE OF HHC ORDERS,FOLLOWING FOR D/C.  10/19/14 [place] RN,BSN NCM [phone] SPOKE TO DTR JAMELLA C#[phone]  [id number],STATES SHE PLANS TO TAKE HER MOTHER TO [place], [place] ON THE WEEKEND.PT-HH.PER INSURANCE NOT COVERED, THEREFORE HHRN(SAFETY EVAL) CAN BE ORDERED.[facility] CHOSEN FOR HHC.TC KRISTEN REP AWARE OF REFERRAL, & D/C PLAN, AWAIT RESPONSE IF THEY CAN ACCEPT.DTR HAS A PCP-FOR [place],PROVIDED W/RELEASE OF INFO FORM FOR MEDICAL RECORDS.PATIENT DEFERS TO DTR.DTR VOICED UNDERSTANDING.  10/16/14 Keena Dinse RN,BSN NCM [place]AF[place].NOTED PT/OT-SNF.CSW NOTIFIED.PROVIDED [facility] AGENCY LIST FOR [place].AWAIT CHOICE FOR [place].  10/15/14 Tsering Leaman RN,BSN NCM [phone] AMIODARONE GTT.ONCOLOGY FOLLOWING-FOR CHEMO.PT-HH.WILL PROVIDE HHC LIST IN [place].AWAIT FINAL HHC ORDERS.  10/13/14 Laretta Pyatt RN,BSN NCM [phone] PROVDIED W/PCP LISTING-MEDICAID PROVIDERS.PATIENT TEARFUL ABOUT CURRENT SITUATION-HER FAMILY IS IN [place].DOES NOT HAVE GOOD SOCIAL SUPPORT HER IN [place] AREA.SHE WANTS TO D/C BACK TO [place].SHE IS CONCERNED THT FAMILY MAY NOT HAVE FINANCIAL ABILITY OR VEHICLE TO TRANSPORT HER TO [place].REFERRED TO CSW ABOUT TRANSP.  10/12/14 Antonio Creswell RN,BSN NCM [phone] CARDIZEM GTT,IV ABX.NO ANTICIPATED D/C NEEDS.

## 2014-10-13 NOTE — Progress Notes (Signed)
[facility] PROGRESS NOTE  Sabrina Adkins KKX:[id number] DOB: [birth year] DOA: 10/11/2014 PCP: Philis Fendt, MD  Assessment/Plan: 1. [place] with new mets 1. New brain mets on brain MRI 2. CT abd/pelvis w/ PO contrast ordered to r/o intra-abdominal mets, pending 3. Oncology following. Pt has decided on treatments. 2. SVT/PAF 1. Cardiology following 2. Currently on cardizem, titrating dose as tolerated 3. Presently, HR of low-100's 3. UTI 1. Cont on levaquin 2. 11/1 Urine cx with 75,000 multiple morphotypes present, none predominant 3. Will re-order urine culture, but as pt has already been treated with abx, culture will likely show no growth 4. Leukocytosis of 21k, up from 19.5k 5. Afebrile 4. HTN 1. Stable, controlled 2. Cont current regimen 5. DM 1. On 35 units lantus 2. Cont SSI coverage 6. DVT prophylaxis 1. Heparin subQ  Code Status: Full Family Communication: Pt in room Disposition Plan: Pending   Consultants:  Cardiology  Oncology  Procedures:    Antibiotics:  Levaquin 11/1>>>  HPI/Subjective: No acute events noted overnight. Pt complains of photophobia involving R eye  Objective: Filed Vitals:   10/13/14 0809 10/13/14 0900 10/13/14 1353 10/13/14 1409  BP:  128/83  112/78  Pulse:  106  103  Temp:    98.4 F (36.9 C)  TempSrc:    Oral  Resp:    20  Height:      Weight:      SpO2: 91%  93% 100%    Intake/Output Summary (Last 24 hours) at 10/13/14 1426 Last data filed at 10/13/14 1410  Gross per 24 hour  Intake   2210 ml  Output    800 ml  Net   1410 ml   Filed Weights   10/11/14 2029 10/12/14 0440 10/12/14 1722  Weight: 113.717 kg (250 lb 11.2 oz) 113.7 kg (250 lb 10.6 oz) 113 kg (249 lb 1.9 oz)    Exam:   General:  Awake, in nad  Cardiovascular: regular, s1, s2  Respiratory: normal resp effort, no wheezing  Abdomen: soft, nondistended  Musculoskeletal: perfused, no clubbin   Data Reviewed: Basic Metabolic  Panel:  Recent Labs Lab 10/11/14 1154 10/12/14 0321  NA 132* 137  K 3.9 4.0  CL 90* 94*  CO2 29 33*  GLUCOSE 349* 422*  BUN 12 13  CREATININE 0.64 0.73  CALCIUM 10.2 10.0   Liver Function Tests:  Recent Labs Lab 10/11/14 1154  AST 17  ALT 17  ALKPHOS 67  BILITOT 0.6  PROT 7.2  ALBUMIN 3.6   No results for input(s): LIPASE, AMYLASE in the last 168 hours. No results for input(s): AMMONIA in the last 168 hours. CBC:  Recent Labs Lab 10/11/14 1154 10/12/14 0321  WBC 19.5* 21.0*  NEUTROABS 15.2*  --   HGB 14.4 13.6  HCT 42.8 41.1  MCV 71.1* 70.7*  PLT 218 204   Cardiac Enzymes: No results for input(s): CKTOTAL, CKMB, CKMBINDEX, TROPONINI in the last 168 hours. BNP (last 3 results) No results for input(s): PROBNP in the last 8760 hours. CBG:  Recent Labs Lab 10/12/14 1145 10/12/14 1734 10/12/14 2122 10/13/14 0737 10/13/14 1157  GLUCAP 305* 282* 368* 317* 320*    Recent Results (from the past 240 hour(s))  Urine culture     Status: None   Collection Time: 10/11/14 12:33 PM  Result Value Ref Range Status   Specimen Description URINE, CLEAN CATCH  Final   Special Requests NONE  Final   Culture  Setup Time   Final  10/12/2014 03:08 Performed at [facility]   Final    75,000 COLONIES/ML Performed at [facility]   Final    Multiple bacterial morphotypes present, none predominant. Suggest appropriate recollection if clinically indicated. Performed at [facility]    Report Status 10/13/2014 FINAL  Final  MRSA PCR Screening     Status: None   Collection Time: 10/12/14 10:29 AM  Result Value Ref Range Status   MRSA by PCR NEGATIVE NEGATIVE Final    Comment:        The GeneXpert MRSA Assay (FDA approved for NASAL specimens only), is one component of a comprehensive MRSA colonization surveillance program. It is not intended to diagnose MRSA infection nor to guide or monitor treatment  for MRSA infections.      Studies: Mr Kizzie Fantasia Contrast  10/23/14   CLINICAL DATA:  Initial encounter. New diagnosis lung cancer. Right-sided headache, 3 days duration. Staging.  EXAM: MRI HEAD WITHOUT AND WITH CONTRAST  TECHNIQUE: Multiplanar, multiecho pulse sequences of the brain and surrounding structures were obtained without and with intravenous contrast.  CONTRAST:  88mL MULTIHANCE GADOBENATE DIMEGLUMINE 529 MG/ML IV SOLN  COMPARISON:  None.  FINDINGS: There are 3 metastatic lesions noted affecting the brain. Within the right lateral cerebellum, there is a 7 mm metastasis. This contains some petechial blood products. At the right parietal vertex, there is a 9 mm metastasis. This contains minimal petechial blood products. In the left posterior parietal lobe, there is a 5-6 mm metastasis.  No evidence of ischemic infarction. No evidence of chronic small vessel disease. No hydrocephalus. No extra-axial fluid collection. No calvarial lesion is seen.  In the right orbit, there is a well-circumscribed abnormality measuring approximately 1 cm in size. This probably represents a venous varix. I cannot be certain this does not represent a metastasis to the extra-ocular muscles (superior rectus).  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 Right cerebellum. Right parietal vertex. Left posterior parietal region. Early petechial blood products in the right cerebellar and right parietal lesions.  Orbital lesion on the right associated with the superior rectus muscle. This could represent a venous varix or metastasis. This could be studied with CT or dedicated orbital MRI should differentiation be important.   Electronically Signed   By: Nelson Chimes M.D.   On: 10/23/2014 19:36    Scheduled Meds: . clopidogrel  75 mg Oral Daily  . diltiazem  60 mg Oral 4 times per day  . docusate sodium  100 mg Oral BID  . gabapentin  100 mg Oral TID  . heparin  5,000 Units Subcutaneous  3 times per day  . insulin aspart  0-15 Units Subcutaneous TID WC  . insulin glargine  35 Units Subcutaneous QHS  . ipratropium-albuterol  3 mL Nebulization TID  . levofloxacin (LEVAQUIN) IV  500 mg Intravenous Q24H  . metoprolol tartrate  25 mg Oral TID  . multivitamin with minerals  1 tablet Oral Daily  . QUEtiapine  50 mg Oral QHS  . senna  1 tablet Oral BID   Continuous Infusions: . sodium chloride 75 mL/hr at 10/12/14 1836    Active Problems:   Metastatic lung cancer (metastasis from lung to other site)   DM (diabetes mellitus)   HTN (hypertension)   UTI (lower urinary tract infection)   Supraventricular tachycardia   PAF (paroxysmal atrial fibrillation)   Ventricular tachycardia  Time spent: 43min  Exodus Kutzer K  [facility] Pager [phone]. If 7PM-7AM, please contact night-coverage at [email or web address], password [facility] 10/13/2014, 2:26 PM  LOS: 2 days

## 2014-10-13 NOTE — Telephone Encounter (Signed)
Patient still in hospital.  Will continue to watch for discharge and update patient's condition at that time.

## 2014-10-13 NOTE — Progress Notes (Signed)
This am call stating Pt's HR was 140's EKG performed and Dr Ron Parker notified and informed. PO Cardizem ordered.

## 2014-10-13 NOTE — Plan of Care (Signed)
Problem: Phase II Progression Outcomes Goal: Ventricular heart rate < 100/min Outcome: Progressing Goal: Pain controlled Outcome: Progressing Goal: Progress activity as tolerated unless otherwise ordered Outcome: Progressing

## 2014-10-13 NOTE — Progress Notes (Signed)
Patient ID: Sabrina Adkins, female   DOB: [birth year], 53 y.o.   MRN: [id number]    SUBJECTIVE: patient is seen to follow-up her supraventricular arrhythmias. She is mildly uncomfortable in bed this morning.  Two-dimensional echo has been done. She has normal left ventricular function.   Filed Vitals:   10/12/14 1551 10/12/14 1722 10/12/14 2125 10/13/14 0524  BP: 114/78 99/66 116/69 110/73  Pulse: 83 87 100 96  Temp: 98.3 F (36.8 C) 98.4 F (36.9 C) 98 F (36.7 C) 98.4 F (36.9 C)  TempSrc: Oral Oral Oral Oral  Resp: 16 18 18 18   Height:  5\' 5"  (1.651 m)    Weight:  249 lb 1.9 oz (113 kg)    SpO2: 90% 96% 97% 94%     Intake/Output Summary (Last 24 hours) at 10/13/14 0749 Last data filed at 10/12/14 2300  Gross per 24 hour  Intake    960 ml  Output      0 ml  Net    960 ml    LABS: Basic Metabolic Panel:  Recent Labs  10/11/14 1154 10/12/14 0321  NA 132* 137  K 3.9 4.0  CL 90* 94*  CO2 29 33*  GLUCOSE 349* 422*  BUN 12 13  CREATININE 0.64 0.73  CALCIUM 10.2 10.0   Liver Function Tests:  Recent Labs  10/11/14 1154  AST 17  ALT 17  ALKPHOS 67  BILITOT 0.6  PROT 7.2  ALBUMIN 3.6   No results for input(s): LIPASE, AMYLASE in the last 72 hours. CBC:  Recent Labs  10/11/14 1154 10/12/14 0321  WBC 19.5* 21.0*  NEUTROABS 15.2*  --   HGB 14.4 13.6  HCT 42.8 41.1  MCV 71.1* 70.7*  PLT 218 204   Cardiac Enzymes: No results for input(s): CKTOTAL, CKMB, CKMBINDEX, TROPONINI in the last 72 hours. BNP: Invalid input(s): POCBNP D-Dimer: No results for input(s): DDIMER in the last 72 hours. Hemoglobin A1C: No results for input(s): HGBA1C in the last 72 hours. Fasting Lipid Panel: No results for input(s): CHOL, HDL, LDLCALC, TRIG, CHOLHDL, LDLDIRECT in the last 72 hours. Thyroid Function Tests:  Recent Labs  10/11/14 2030  TSH 1.290    RADIOLOGY: Dg Chest 2 View  10/11/2014   CLINICAL DATA:  Chest pain  EXAM: CHEST  2 VIEW  COMPARISON:  CT from  earlier in the same day  FINDINGS: Cardiac show remains elevated with right basilar atelectasis. A dominant right pulmonary nodule is noted as well as some smaller nodules similar to that seen on the prior CT examination. No sizable effusion is noted. No bony abnormality is seen.  IMPRESSION: Right basilar atelectasis with evidence of multiple pulmonary nodules.   Electronically Signed   By: Inez Catalina M.D.   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 Now with shortness of breath. History of DVT.  EXAM: CT ANGIOGRAPHY CHEST WITH CONTRAST  TECHNIQUE: Multidetector CT imaging of the chest was performed using the standard protocol during bolus administration of intravenous contrast. Multiplanar CT image reconstructions and MIPs were obtained to evaluate the vascular anatomy.  CONTRAST:  8mL OMNIPAQUE IOHEXOL 350 MG/ML SOLN  COMPARISON:  CT of the chest without contrast on 08/12/2014  FINDINGS: There has been significant progression of metastatic lung carcinoma in the chest and visualized upper abdomen. The dominant right  middle lobe mass now measures approximately 2.9 x 3.6 cm compared to approximately 2.0 x 2.2 cm on the prior study. A number of metastatic pulmonary nodules are identified in both lungs. Anterior right upper lobe nodule measures 1.2 cm. Superior segment right lower lobe nodule measures 0.5 cm. Nodule abutting the lower major fissure measures 1.3 cm. Right lower lobe peripheral nodule measures 0.7 cm. Several other smaller right lower lobe nodules present. There are at least 6 new pulmonary nodules in the left lower lobe with the largest measuring 1.1 cm. Stable 1.6 cm ground-glass opacity at the right lung apex.  Metastatic adenopathy in the mediastinum also shows significant progression since the prior CT. The  dominant anterior mediastinal lymph node mass abutting both the ascending thoracic aorta and SVC now measures approximately 3.2 x 4.7 cm as compared to roughly 2.5 cm in short axis on the previous exam. Largest right peritracheal lymph node measures 1.6 cm in short axis. Subcarinal lymph node mass measures 2.6 x 3.5 cm. Prevascular lymph node mass anterior to the aortic arch shows enlargement and measures 1.5 cm in short axis. Bilateral axillary lymph nodes also show enlargement with the largest left axillary lymph node measuring 2.3 cm in short axis and the largest right axillary lymph node measuring 1.6 cm in short axis.  Multiple metastatic subcutaneous nodules are identified and are completely new since the prior examination. The largest subcutaneous nodule is in the right anterior lower costal subcutaneous fat and measures 2.8 cm in diameter. This was not present previously. A number of other smaller subcutaneous nodules are present in the left anterior upper abdominal wall fat, fat abutting the chest wall laterally on the left and the subcutaneous fat overlying the left scapula.  The visualized upper abdomen also shows evidence of bilateral adrenal metastasis which were visualized previously. The dominant right adrenal metastasis now measures approximately 3.4 x 3.6 cm compared to 2.7 cm in diameter on the prior study. One or 2 separate left adrenal metastases measure in conglomerate dimensions approximately 2.5 x 3.8 cm. New 1.2 cm nodule posterior to the left upper kidney in the perinephric fat is consistent with metastasis.  No evidence of pulmonary embolism. The thoracic aorta is normal in caliber and shows no evidence of dissection. Diffuse spondylosis of the thoracic spine present. There are no visualized bone metastases in the chest. No pleural effusions or pulmonary infiltrates. There is a small amount of pericardial fluid which appears more prominent compared to the prior CT.  Review of the MIP images  confirms the above findings.  IMPRESSION: Marked progression of metastatic disease in the chest and upper abdomen, as detailed above. There is enlargement of the dominant right middle lobe lung carcinoma an a number of new metastatic nodules in both lungs. Metastatic adenopathy in the mediastinum and both axillary regions has increased in size since the prior CT. 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 There are a number of new subcutaneous metastatic nodules in the subcutaneous fat. New posterior left perinephric metastatic nodules also visualized. The patient presumably has not received any interval treatment since the prior CT. Recommend oncologic consultation. A formal PET scan would also be helpful to complete staging evaluation prior to initiation of any cancer treatment. There is no evidence of pulmonary embolism.  Findings were communicated directly to Dr. Canary Brim in the Emergency Department.   Electronically Signed   By: Aletta Edouard M.D.   On: 10/11/2014 14:30   Mr Jeri Cos [facility] Contrast  10/11/2014  CLINICAL DATA:  Initial encounter. New diagnosis lung cancer. Right-sided headache, 3 days duration. Staging.  EXAM: MRI HEAD WITHOUT AND WITH CONTRAST  TECHNIQUE: Multiplanar, multiecho pulse sequences of the brain and surrounding structures were obtained without and with intravenous contrast.  CONTRAST:  62mL MULTIHANCE GADOBENATE DIMEGLUMINE 529 MG/ML IV SOLN  COMPARISON:  None.  FINDINGS: There are 3 metastatic lesions noted affecting the brain. Within the right lateral cerebellum, there is a 7 mm metastasis. This contains some petechial blood products. At the right parietal vertex, there is a 9 mm metastasis. This contains minimal petechial blood products. In the left posterior parietal lobe, there is a 5-6 mm metastasis.  No evidence of ischemic infarction. No evidence of chronic small vessel disease. No hydrocephalus. No extra-axial fluid collection. No calvarial lesion is seen.  In the  right orbit, there is a well-circumscribed abnormality measuring approximately 1 cm in size. This probably represents a venous varix. I cannot be certain this does not represent a metastasis to the extra-ocular muscles (superior rectus).  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 Right cerebellum. Right parietal vertex. Left posterior parietal region. Early petechial blood products in the right cerebellar and right parietal lesions.  Orbital lesion on the right associated with the superior rectus muscle. This could represent a venous varix or metastasis. This could be studied with CT or dedicated orbital MRI should differentiation be important.   Electronically Signed   By: Nelson Chimes M.D.   On: 10/11/2014 19:36   Dg Chest Port 1 View  09/23/2014   CLINICAL DATA:  Postop bronchoscopy.  EXAM: PORTABLE CHEST - 1 VIEW  COMPARISON:  08/12/2014  FINDINGS: Low lung volumes. Right lower lobe airspace opacity could be related to bronchoscopy or represent atelectasis. No pneumothorax. Previously seen mid right lung nodule not well visualized on today's study, likely due to the low volumes and airspace disease. Minimal left base atelectasis. Heart is borderline in size.  IMPRESSION: Low lung volumes with bibasilar opacities, likely atelectasis, right greater than left. No pneumothorax.   Electronically Signed   By: Rolm Baptise M.D.   On: 09/23/2014 13:10   Dg C-arm Bronchoscopy  09/23/2014   CLINICAL DATA: right middle lobe lung mass   C-ARM BRONCHOSCOPY  Fluoroscopy was utilized by the requesting physician.  No radiographic  interpretation.     PHYSICAL EXAM  Patient is overweight. She is sleepy but oriented to person time and place. Affect is normal. Head is atraumatic. Sclera and conjunctiva are normal. Lungs reveal decreased breath sounds. There is no respiratory distress. Cardiac exam reveals S1 and S2. Abdomen is soft. There is no significant peripheral  edema.   TELEMETRY: I have reviewed telemetry today. There is sinus rhythm. In addition there was a 5 beat run of ventricular tachycardia. Also there was a burst of supraventricular tachycardia with a rate of approximately 160.   ASSESSMENT AND PLAN:    Metastatic lung cancer (metastasis from lung to other site)   DM (diabetes mellitus)   HTN (hypertension)   UTI (lower urinary tract infection)    Supraventricular tachycardia   PAF (paroxysmal atrial fibrillation)     At this time I do not see any IV diltiazem hanging in the room. I have discontinued any pending order for IV diltiazem. I have started 60 mg of oral diltiazem 4 times daily. Assuming she tolerates this well I will change it to a once a day medication tomorrow.  Ventricular tachycardia    Patient  had a 5 beat run of ventricular tachycardia. She has normal left jugular function. The plan will be to be sure that her potassium and magnesium remained normal. No further workup at this time.       Dola Argyle 10/13/2014 7:49 AM

## 2014-10-13 NOTE — Progress Notes (Signed)
This morning right after patient got her heparin injection, patient's HR went up into the 170's for a few seconds, then came back down to 100. Asymptomatic. Will continue to monitor.

## 2014-10-13 NOTE — Progress Notes (Signed)
Inpatient Diabetes Program Recommendations  AACE/ADA: New Consensus Statement on Inpatient Glycemic Control (2013)  Target Ranges:  Prepandial:   less than 140 mg/dL      Peak postprandial:   less than 180 mg/dL (1-2 hours)      Critically ill patients:  140 - 180 mg/dL   Reason for Visit: Diabetes Consult  Results for Sabrina, Adkins (MRN [id number]) as of 10/13/2014 12:49  Ref. Range 10/12/2014 11:45 10/12/2014 17:34 10/12/2014 21:22 10/13/2014 07:37 10/13/2014 11:57  Glucose-Capillary Latest Range: 70-99 mg/dL 305 (H) 282 (H) 368 (H) 317 (H) 320 (H)  Results for Sabrina, Adkins (MRN [id number]) as of 10/13/2014 12:49  Ref. Range 08/13/2014 13:14  Hgb A1c MFr Bld Latest Range: <5.7 % 9.7 (H)   Hyperglycemia with blood sugars in 300s. HgbA1C indicates poor glycemic control at home. Will need adjustments to diabetes medications prior to discharge. Currently has PCP.  Inpatient Diabetes Program Recommendations Insulin - Basal: Increase Lantus to 40 units QHS Correction (SSI): Increase Novolog to resistant tidwc and hs Insulin - Meal Coverage: Add Novolog 4 units tidwc for meal coverage insulin if pt eats >50% meal HgbA1C: 9.7% - uncontrolled   Note: Will continue to follow daily. Thank you. Lorenda Peck, RD, LDN, CDE Inpatient Diabetes Coordinator [phone]

## 2014-10-13 NOTE — Progress Notes (Signed)
Pt with Afib with RVR with activity, HR up to 180s. This was new per tele monitor, so Dr. Ron Parker made aware. Dr. Ron Parker had already ordered Cardizem 60mg  QID. Will have bedside RN give dose now and continue to monitor pt's HR.

## 2014-10-14 DIAGNOSIS — C799 Secondary malignant neoplasm of unspecified site: Secondary | ICD-10-CM | POA: Insufficient documentation

## 2014-10-14 DIAGNOSIS — I471 Supraventricular tachycardia: Secondary | ICD-10-CM

## 2014-10-14 DIAGNOSIS — N390 Urinary tract infection, site not specified: Secondary | ICD-10-CM

## 2014-10-14 DIAGNOSIS — C7989 Secondary malignant neoplasm of other specified sites: Secondary | ICD-10-CM

## 2014-10-14 DIAGNOSIS — C801 Malignant (primary) neoplasm, unspecified: Secondary | ICD-10-CM

## 2014-10-14 DIAGNOSIS — C7970 Secondary malignant neoplasm of unspecified adrenal gland: Secondary | ICD-10-CM

## 2014-10-14 DIAGNOSIS — I4891 Unspecified atrial fibrillation: Secondary | ICD-10-CM

## 2014-10-14 LAB — BASIC METABOLIC PANEL
ANION GAP: 11 (ref 5–15)
BUN: 15 mg/dL (ref 6–23)
CO2: 28 mEq/L (ref 19–32)
CREATININE: 0.7 mg/dL (ref 0.50–1.10)
Calcium: 9.7 mg/dL (ref 8.4–10.5)
Chloride: 92 mEq/L — ABNORMAL LOW (ref 96–112)
GFR calc Af Amer: >90 mL/min (ref >90)
GFR calc non Af Amer: >90 mL/min (ref >90)
Glucose, Bld: 278 mg/dL — ABNORMAL HIGH (ref 70–99)
Potassium: 4.1 mEq/L (ref 3.7–5.3)
Sodium: 131 mEq/L — ABNORMAL LOW (ref 137–147)

## 2014-10-14 LAB — GLUCOSE, CAPILLARY
GLUCOSE-CAPILLARY: 317 mg/dL — AB (ref 70–99)
Glucose-Capillary: 255 mg/dL — ABNORMAL HIGH (ref 70–99)
Glucose-Capillary: 330 mg/dL — ABNORMAL HIGH (ref 70–99)
Glucose-Capillary: 340 mg/dL — ABNORMAL HIGH (ref 70–99)

## 2014-10-14 LAB — CBC
HCT: 39.1 % (ref 36.0–46.0)
HEMOGLOBIN: 12.7 g/dL (ref 12.0–15.0)
MCH: 23.2 pg — ABNORMAL LOW (ref 26.0–34.0)
MCHC: 32.5 g/dL (ref 30.0–36.0)
MCV: 71.5 fL — ABNORMAL LOW (ref 78.0–100.0)
Platelets: 171 10*3/uL (ref 150–400)
RBC: 5.47 MIL/uL — ABNORMAL HIGH (ref 3.87–5.11)
RDW: 13.8 % (ref 11.5–15.5)
WBC: 22.6 10*3/uL — AB (ref 4.0–10.5)

## 2014-10-14 MED ORDER — AMIODARONE LOAD VIA INFUSION
150.0000 mg | Freq: Once | INTRAVENOUS | Status: AC
  Administered 2014-10-14: 150 mg via INTRAVENOUS
  Filled 2014-10-14: qty 83.34

## 2014-10-14 MED ORDER — SODIUM CHLORIDE 0.9 % IV SOLN
1000.0000 mg/m2 | Freq: Once | INTRAVENOUS | Status: AC
  Administered 2014-10-14: 2280 mg via INTRAVENOUS
  Filled 2014-10-14: qty 60

## 2014-10-14 MED ORDER — COLD PACK MISC ONCOLOGY
1.0000 | Freq: Once | Status: AC | PRN
  Filled 2014-10-14: qty 1

## 2014-10-14 MED ORDER — SODIUM CHLORIDE 0.9 % IV SOLN
Freq: Once | INTRAVENOUS | Status: AC
  Administered 2014-10-14: 40 mg via INTRAVENOUS
  Filled 2014-10-14 (×2): qty 8

## 2014-10-14 MED ORDER — SODIUM CHLORIDE 0.9 % IJ SOLN: 10.0000 mL | INTRAMUSCULAR | Status: DC | PRN

## 2014-10-14 MED ORDER — AMIODARONE HCL IN DEXTROSE 360-4.14 MG/200ML-% IV SOLN
60.0000 mg/h | INTRAVENOUS | Status: AC
  Administered 2014-10-14 (×2): 60 mg/h via INTRAVENOUS
  Filled 2014-10-14 (×2): qty 200

## 2014-10-14 MED ORDER — AMIODARONE IV BOLUS ONLY 150 MG/100ML: 150.0000 mg | Freq: Once | INTRAVENOUS | Status: DC

## 2014-10-14 MED ORDER — HEPARIN SOD (PORK) LOCK FLUSH 100 UNIT/ML IV SOLN: 500.0000 [IU] | Freq: Once | INTRAVENOUS | Status: AC | PRN

## 2014-10-14 MED ORDER — HYDROMORPHONE HCL 1 MG/ML IJ SOLN
1.0000 mg | Freq: Once | INTRAMUSCULAR | Status: AC
  Administered 2014-10-14: 1 mg via INTRAVENOUS
  Filled 2014-10-14: qty 1

## 2014-10-14 MED ORDER — AMIODARONE HCL IN DEXTROSE 360-4.14 MG/200ML-% IV SOLN
60.0000 mg/h | INTRAVENOUS | Status: DC
  Administered 2014-10-14: 30 mg/h via INTRAVENOUS
  Administered 2014-10-15: 60 mg/h via INTRAVENOUS
  Administered 2014-10-15: 30 mg/h via INTRAVENOUS
  Administered 2014-10-15 – 2014-10-19 (×13): 60 mg/h via INTRAVENOUS
  Filled 2014-10-14 (×26): qty 200

## 2014-10-14 MED ORDER — HEPARIN SOD (PORK) LOCK FLUSH 100 UNIT/ML IV SOLN: 250.0000 [IU] | Freq: Once | INTRAVENOUS | Status: AC | PRN

## 2014-10-14 MED ORDER — SODIUM CHLORIDE 0.9 % IV SOLN
Freq: Once | INTRAVENOUS | Status: AC
  Administered 2014-10-14: 09:00:00 via INTRAVENOUS

## 2014-10-14 MED ORDER — SODIUM CHLORIDE 0.9 % IJ SOLN: 3.0000 mL | INTRAMUSCULAR | Status: DC | PRN

## 2014-10-14 MED ORDER — SODIUM CHLORIDE 0.9 % IV SOLN
750.0000 mg | Freq: Once | INTRAVENOUS | Status: AC
  Administered 2014-10-14: 750 mg via INTRAVENOUS
  Filled 2014-10-14: qty 75

## 2014-10-14 MED ORDER — ALTEPLASE 2 MG IJ SOLR: 2.0000 mg | Freq: Once | INTRAMUSCULAR | Status: AC | PRN

## 2014-10-14 NOTE — Progress Notes (Signed)
Paged Dr. Ron Parker about HR of 175. 10 beats of VTACH. Verbal order to given second bolus of amiodarone. BP stable. Pt symptomatic with left sided lower arm pain. Will continue to monitor.

## 2014-10-14 NOTE — Progress Notes (Signed)
Around 0520, patient's HR converted into a.fib with RVR in the 160's. EKG taken. After about 15 minutes it went back into NSR, then it would jump up and down again. Cardiology was notified and no new orders were given. RN told to monitor patient and call if patient became symptomatic. Then patient's HR started sustaining again in 160's SVT. BP WNL. Patient complaining of severe pain in her left arm which was new. Hospitalist on call notified and new orders given for a one time dose of Dilaudid. Will continue to monitor and report to day shift.

## 2014-10-14 NOTE — Telephone Encounter (Signed)
Received call from [place] at [facility], she advised me that Patient had MRI Brain done on 11/1 while in hospital.  Advised Dr. Lamonte Sakai.  Still awaiting patient's discharged from hospital to obtain status follow up.

## 2014-10-14 NOTE — Progress Notes (Addendum)
Patient ID: Sabrina Adkins, female   DOB: 10/07/1961, 53 y.o.   MRN: [id number]    SUBJECTIVE:  The patient is having recurrent runs of supraventricular tachycardia with a rate of 160. She has had an episode this morning that is persisting. Her blood pressure is stable. I will start IV amiodarone. There is question of a cross allergy with history of iodine allergy. By history she may have had a skin reaction in the past. There is no evidence of a severe reaction in the past. We will not be able to control this patient's overall cardiovascular status without using amiodarone at this time.   Filed Vitals:   10/14/14 0453 10/14/14 0500 10/14/14 0534 10/14/14 0630  BP: 121/75 110/56 116/74 121/76  Pulse: 98 95  160  Temp:  98.9 F (37.2 C)    TempSrc:  Oral    Resp:  18    Height:      Weight:      SpO2:  92%       Intake/Output Summary (Last 24 hours) at 10/14/14 0732 Last data filed at 10/13/14 2300  Gross per 24 hour  Intake   2550 ml  Output   1600 ml  Net    950 ml    LABS: Basic Metabolic Panel:  Recent Labs  10/12/14 0321 10/14/14 0345  NA 137 131*  K 4.0 4.1  CL 94* 92*  CO2 33* 28  GLUCOSE 422* 278*  BUN 13 15  CREATININE 0.73 0.70  CALCIUM 10.0 9.7   Liver Function Tests:  Recent Labs  10/11/14 1154  AST 17  ALT 17  ALKPHOS 67  BILITOT 0.6  PROT 7.2  ALBUMIN 3.6   No results for input(s): LIPASE, AMYLASE in the last 72 hours. CBC:  Recent Labs  10/11/14 1154 10/12/14 0321 10/14/14 0345  WBC 19.5* 21.0* 22.6*  NEUTROABS 15.2*  --   --   HGB 14.4 13.6 12.7  HCT 42.8 41.1 39.1  MCV 71.1* 70.7* 71.5*  PLT 218 204 171   Cardiac Enzymes: No results for input(s): CKTOTAL, CKMB, CKMBINDEX, TROPONINI in the last 72 hours. BNP: Invalid input(s): POCBNP D-Dimer: No results for input(s): DDIMER in the last 72 hours. Hemoglobin A1C: No results for input(s): HGBA1C in the last 72 hours. Fasting Lipid Panel: No results for input(s): CHOL, HDL,  LDLCALC, TRIG, CHOLHDL, LDLDIRECT in the last 72 hours. Thyroid Function Tests:  Recent Labs  10/11/14 2030  TSH 1.290    RADIOLOGY: Ct Abdomen Pelvis Wo Contrast  10/13/2014   CLINICAL DATA:  Newly diagnosed metastatic lung cancer. No abdominal plate.  EXAM: CT ABDOMEN AND PELVIS WITHOUT CONTRAST  TECHNIQUE: Multidetector CT imaging of the abdomen and pelvis was performed following the standard protocol without IV contrast.  COMPARISON:  Chest CT 10/11/2014  FINDINGS: Right middle lobe mass at the right lung base again noted, measuring up to 3.8 cm compared with 3.6 cm. The slight change likely related to differences in scan planes. Other smaller bilateral lower lobe nodules are noted as measured on recent chest CT. Small right pleural effusion. Atelectasis in the right lower lobe and right middle lobe at the right lung base. Heart is normal size.  Numerous subcutaneous and chest wall nodules noted, the largest in the left lower chest wall measuring up to 2.4 cm, stable.  3.2 cm right adrenal mass. Left adrenal mass measures up to 4.1 cm. These slight differences likely reflect slight differences in scan planes. No definite focal hepatic abnormality  on this unenhanced study. Spleen, pancreas have an unremarkable unenhanced appearance.  Innumerable retroperitoneal nodules and masses. Index left retroperitoneal nodule on image 45 measures 2.4 cm. Numerous other small retroperitoneal/perinephric nodules bilaterally. Numerous subcutaneous nodules throughout the subcutaneous soft tissues of the abdomen and pelvis. Index right lateral subcutaneous nodule measures 1.8 cm on image 51. Index left anterior pelvic subcutaneous nodule measures 2.4 cm on image 68. Subcutaneous nodules within the inguinal regions bilaterally. Large right anterior subcutaneous nodule superiorly in the abdomen over the in the liver measures 2.8 cm on image 27.  Uterus and adnexa have an unremarkable unenhanced appearance. Urinary  bladder unremarkable. Moderate stool burden throughout the colon. Small bowel is decompressed. Aorta is normal caliber.  No acute bony abnormality or focal bone lesion.  IMPRESSION: Extensive metastatic disease including in the low lower lung zones bilaterally, bilateral adrenal glands, as well as numerous retroperitoneal and subcutaneous nodules.  Study is limited in evaluation of the solid organs without intravenous contrast.  Dominant mass in the right middle lobe at the right lung base.  Bibasilar atelectasis.   Electronically Signed   By: Rolm Baptise M.D.   On: 10/13/2014 16:49   Dg Chest 2 View  10/11/2014   CLINICAL DATA:  Chest pain  EXAM: CHEST  2 VIEW  COMPARISON:  CT from earlier in the same day  FINDINGS: Cardiac show remains elevated with right basilar atelectasis. A dominant right pulmonary nodule is noted as well as some smaller nodules similar to that seen on the prior CT examination. No sizable effusion is noted. No bony abnormality is seen.  IMPRESSION: Right basilar atelectasis with evidence of multiple pulmonary nodules.   Electronically Signed   By: Inez Catalina M.D.   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 Now with shortness of breath. History of DVT.  EXAM: CT ANGIOGRAPHY CHEST WITH CONTRAST  TECHNIQUE: Multidetector CT imaging of the chest was performed using the standard protocol during bolus administration of intravenous contrast. Multiplanar CT image reconstructions and MIPs were obtained to evaluate the vascular anatomy.  CONTRAST:  60mL OMNIPAQUE IOHEXOL 350 MG/ML SOLN  COMPARISON:  CT of the chest without contrast on 08/12/2014  FINDINGS: There has been significant progression of metastatic lung carcinoma in the chest and visualized upper abdomen. The dominant right middle lobe mass now measures  approximately 2.9 x 3.6 cm compared to approximately 2.0 x 2.2 cm on the prior study. A number of metastatic pulmonary nodules are identified in both lungs. Anterior right upper lobe nodule measures 1.2 cm. Superior segment right lower lobe nodule measures 0.5 cm. Nodule abutting the lower major fissure measures 1.3 cm. Right lower lobe peripheral nodule measures 0.7 cm. Several other smaller right lower lobe nodules present. There are at least 6 new pulmonary nodules in the left lower lobe with the largest measuring 1.1 cm. Stable 1.6 cm ground-glass opacity at the right lung apex.  Metastatic adenopathy in the mediastinum also shows significant progression since the prior CT. The dominant anterior mediastinal lymph node mass abutting both the ascending thoracic aorta and SVC now measures approximately 3.2 x 4.7 cm as compared to roughly 2.5 cm in short axis on the previous exam. Largest right peritracheal lymph node measures 1.6 cm in short axis. Subcarinal lymph node mass measures 2.6 x 3.5 cm. Prevascular lymph  node mass anterior to the aortic arch shows enlargement and measures 1.5 cm in short axis. Bilateral axillary lymph nodes also show enlargement with the largest left axillary lymph node measuring 2.3 cm in short axis and the largest right axillary lymph node measuring 1.6 cm in short axis.  Multiple metastatic subcutaneous nodules are identified and are completely new since the prior examination. The largest subcutaneous nodule is in the right anterior lower costal subcutaneous fat and measures 2.8 cm in diameter. This was not present previously. A number of other smaller subcutaneous nodules are present in the left anterior upper abdominal wall fat, fat abutting the chest wall laterally on the left and the subcutaneous fat overlying the left scapula.  The visualized upper abdomen also shows evidence of bilateral adrenal metastasis which were visualized previously. The dominant right adrenal metastasis  now measures approximately 3.4 x 3.6 cm compared to 2.7 cm in diameter on the prior study. One or 2 separate left adrenal metastases measure in conglomerate dimensions approximately 2.5 x 3.8 cm. New 1.2 cm nodule posterior to the left upper kidney in the perinephric fat is consistent with metastasis.  No evidence of pulmonary embolism. The thoracic aorta is normal in caliber and shows no evidence of dissection. Diffuse spondylosis of the thoracic spine present. There are no visualized bone metastases in the chest. No pleural effusions or pulmonary infiltrates. There is a small amount of pericardial fluid which appears more prominent compared to the prior CT.  Review of the MIP images confirms the above findings.  IMPRESSION: Marked progression of metastatic disease in the chest and upper abdomen, as detailed above. There is enlargement of the dominant right middle lobe lung carcinoma an a number of new metastatic nodules in both lungs. Metastatic adenopathy in the mediastinum and both axillary regions has increased in size since the prior CT. 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 There are a number of new subcutaneous metastatic nodules in the subcutaneous fat. New posterior left perinephric metastatic nodules also visualized. The patient presumably has not received any interval treatment since the prior CT. Recommend oncologic consultation. A formal PET scan would also be helpful to complete staging evaluation prior to initiation of any cancer treatment. There is no evidence of pulmonary embolism.  Findings were communicated directly to Dr. Canary Brim in the Emergency Department.   Electronically Signed   By: Aletta Edouard M.D.   On: 10/11/2014 14:30   Mr Jeri Cos UV Contrast  10/11/2014   CLINICAL DATA:  Initial encounter. New diagnosis lung cancer. Right-sided headache, 3 days duration. Staging.  EXAM: MRI HEAD WITHOUT AND WITH CONTRAST  TECHNIQUE: Multiplanar, multiecho pulse sequences of the  brain and surrounding structures were obtained without and with intravenous contrast.  CONTRAST:  49mL MULTIHANCE GADOBENATE DIMEGLUMINE 529 MG/ML IV SOLN  COMPARISON:  None.  FINDINGS: There are 3 metastatic lesions noted affecting the brain. Within the right lateral cerebellum, there is a 7 mm metastasis. This contains some petechial blood products. At the right parietal vertex, there is a 9 mm metastasis. This contains minimal petechial blood products. In the left posterior parietal lobe, there is a 5-6 mm metastasis.  No evidence of ischemic infarction. No evidence of chronic small vessel disease. No hydrocephalus. No extra-axial fluid collection. No calvarial lesion is seen.  In the right orbit, there is a well-circumscribed abnormality measuring approximately 1 cm in size. This probably represents a venous varix. I cannot be certain this does not represent a metastasis to the extra-ocular  muscles (superior rectus).  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 Right cerebellum. Right parietal vertex. Left posterior parietal region. Early petechial blood products in the right cerebellar and right parietal lesions.  Orbital lesion on the right associated with the superior rectus muscle. This could represent a venous varix or metastasis. This could be studied with CT or dedicated orbital MRI should differentiation be important.   Electronically Signed   By: Nelson Chimes M.D.   On: 10/11/2014 19:36   Dg Chest Port 1 View  09/23/2014   CLINICAL DATA:  Postop bronchoscopy.  EXAM: PORTABLE CHEST - 1 VIEW  COMPARISON:  08/12/2014  FINDINGS: Low lung volumes. Right lower lobe airspace opacity could be related to bronchoscopy or represent atelectasis. No pneumothorax. Previously seen mid right lung nodule not well visualized on today's study, likely due to the low volumes and airspace disease. Minimal left base atelectasis. Heart is borderline in size.  IMPRESSION: Low lung volumes  with bibasilar opacities, likely atelectasis, right greater than left. No pneumothorax.   Electronically Signed   By: Rolm Baptise M.D.   On: 09/23/2014 13:10   Dg C-arm Bronchoscopy  09/23/2014   CLINICAL DATA: right middle lobe lung mass   C-ARM BRONCHOSCOPY  Fluoroscopy was utilized by the requesting physician.  No radiographic  interpretation.     PHYSICAL EXAM  Patient is lying flat in bed. She is having some arm pain. Cardiac exam reveals S1 and S2. The rate is rapid. Lungs reveal scattered rhonchi. There is no significant peripheral edema.   TELEMETRY:  I personally reviewed telemetry today October 14, 2014. She currently has rapid supraventricular tachycardia. The rate is 160. The rhythm is regular.   ASSESSMENT AND PLAN:    Metastatic lung cancer (metastasis from lung to other site)   DM (diabetes mellitus)   HTN (hypertension)   UTI (lower urinary tract infection)    Supraventricular tachycardia      The patient has rapid supraventricular tachycardia this morning. I have ordered IV amiodarone. I have outlined above that I am aware that there is a cross reactivity with her iodine allergy. The iodine allergy may have been mild skin reaction in the past. There is no evidence of a severe reaction. Amiodarone is the drug of choice for this patient at this time. The benefit outweighs the risk. I will stop oral Cardizem while the patient is on IV amiodarone. It will probably be necessary to continue oral amiodarone going forward.    PAF (paroxysmal atrial fibrillation)    Ventricular tachycardia    There has been no further ventricular tachycardia since yesterday.   Dola Argyle 10/14/2014 7:32 AM

## 2014-10-14 NOTE — Progress Notes (Signed)
[facility] PROGRESS NOTE  Sabrina Adkins GDJ:[id number] DOB: [birth year] DOA: 10/11/2014  PCP: Philis Fendt, MD  Brief HPI: Sabrina Adkins is a 53 y.o. 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. She presented with painful multiple subcutaneous nodules beneath her breasts, and the anterior and shoulder area and upper abdominal wall. CT chest angiogram revealed rapid progression of her lung cancer, withsubcutaneous nodules actually being metastatic disease, but having intra-abdominal metastasis, as well. She was admitted for chemotherapy. Patient then developed supraventricular tachycardia with heart rate being in the 170s. Seen by cardiology and started on Amiodarone.   Past medical history:  Past Medical History  Diagnosis Date  . Diabetes mellitus without complication   . Anxiety   . Clotting disorder     right leg DVT  . Coagulopathy 09/02/2014  . Hypertension   . Shortness of breath     "when I am getting ready to lay down"  . Pneumonia     2015  . Depression   . Bipolar disorder   . GERD (gastroesophageal reflux disease)   . Arthritis     RA  . DVT (deep venous thrombosis)     > 10 years ago, was on coumadin  . Cancer 10/12/2014    metastatic  lung    Consultants: Oncology, Cardiology  Procedures:  2 D ECHO Study Conclusions - Left ventricle: The cavity size was normal. Wall thickness wasnormal. Systolic function was normal. The estimated ejectionfraction was in the range of 55% to 60%. Wall motion was normal;there were no regional wall motion abnormalities. Leftventricular diastolic function parameters were normal. - Pericardium, extracardiac: A trivial pericardial effusion wasidentified.  Antibiotics: None  Subjective: Patient feels well. Denies any chest pain or shortness  of breath. No dizziness. No headaches.   Objective: Vital Signs  Filed Vitals:   10/14/14 0804 10/14/14 0821 10/14/14 0842 10/14/14 0856  BP: 119/70 131/77 121/78 109/86  Pulse: 80 79 175 147  Temp:      TempSrc:      Resp: 18     Height:      Weight:      SpO2:        Intake/Output Summary (Last 24 hours) at 10/14/14 1146 Last data filed at 10/14/14 0900  Gross per 24 hour  Intake   2370 ml  Output   1600 ml  Net    770 ml   Filed Weights   10/11/14 2029 10/12/14 0440 10/12/14 1722  Weight: 113.717 kg (250 lb 11.2 oz) 113.7 kg (250 lb 10.6 oz) 113 kg (249 lb 1.9 oz)    General appearance: alert, cooperative, appears stated age and no distress Head: Normocephalic, without obvious abnormality, atraumatic Resp: clear to auscultation bilaterally Cardio: regular rate and rhythm, S1, S2 normal, no murmur, click, rub or gallop GI: soft, non-tender; bowel sounds normal; no masses,  no organomegaly Neurologic: no focal deficits  Lab Results:  Basic Metabolic Panel:  Recent Labs Lab 10/11/14 1154 10/12/14 0321 10/14/14 0345  NA 132* 137 131*  K 3.9 4.0 4.1  CL 90* 94* 92*  CO2 29 33* 28  GLUCOSE 349* 422* 278*  BUN 12 13 15   CREATININE 0.64 0.73 0.70  CALCIUM 10.2 10.0 9.7   Liver Function Tests:  Recent Labs Lab 10/11/14 1154  AST 17  ALT 17  ALKPHOS 67  BILITOT 0.6  PROT 7.2  ALBUMIN 3.6   CBC:  Recent Labs Lab 10/11/14 1154 10/12/14 0321 10/14/14 0345  WBC 19.5* 21.0* 22.6*  NEUTROABS 15.2*  --   --   HGB 14.4 13.6 12.7  HCT 42.8 41.1 39.1  MCV 71.1* 70.7* 71.5*  PLT 218 204 171   CBG:  Recent Labs Lab 10/12/14 2122 10/13/14 0737 10/13/14 1157 10/13/14 1633 10/13/14 2113  GLUCAP 368* 317* 320* 256* 278*    Recent Results (from the past 240 hour(s))  Urine culture     Status: None   Collection Time: 10/11/14 12:33 PM  Result Value Ref Range Status   Specimen Description URINE, CLEAN CATCH  Final   Special Requests NONE  Final     Culture  Setup Time   Final    10/12/2014 03:08 Performed at [facility]   Final    75,000 COLONIES/ML Performed at [facility]    Culture   Final    Multiple bacterial morphotypes present, none predominant. Suggest appropriate recollection if clinically indicated. Performed at [facility]    Report Status 10/13/2014 FINAL  Final  MRSA PCR Screening     Status: None   Collection Time: 10/12/14 10:29 AM  Result Value Ref Range Status   MRSA by PCR NEGATIVE NEGATIVE Final    Comment:        The GeneXpert MRSA Assay (FDA approved for NASAL specimens only), is one component of a comprehensive MRSA colonization surveillance program. It is not intended to diagnose MRSA infection nor to guide or monitor treatment for MRSA infections.       Studies/Results: Ct Abdomen Pelvis Wo Contrast  10/13/2014   CLINICAL DATA:  Newly diagnosed metastatic lung cancer. No abdominal plate.  EXAM: CT ABDOMEN AND PELVIS WITHOUT CONTRAST  TECHNIQUE: Multidetector CT imaging of the abdomen and pelvis was performed following the standard protocol without IV contrast.  COMPARISON:  Chest CT 10/11/2014  FINDINGS: Right middle lobe mass at the right lung base again noted, measuring up to 3.8 cm compared with 3.6 cm. The slight change likely related to differences in scan planes. Other smaller bilateral lower lobe nodules are noted as measured on recent chest CT. Small right pleural effusion. Atelectasis in the right lower lobe and right middle lobe at the right lung base. Heart is normal size.  Numerous subcutaneous and chest wall nodules noted, the largest in the left lower chest wall measuring up to 2.4 cm, stable.  3.2 cm right adrenal mass. Left adrenal mass measures up to 4.1 cm. These slight differences likely reflect slight differences in scan planes. No definite focal hepatic abnormality on this unenhanced study. Spleen, pancreas have an unremarkable unenhanced  appearance.  Innumerable retroperitoneal nodules and masses. Index left retroperitoneal nodule on image 45 measures 2.4 cm. Numerous other small retroperitoneal/perinephric nodules bilaterally. Numerous subcutaneous nodules throughout the subcutaneous soft tissues of the abdomen and pelvis. Index right lateral subcutaneous nodule measures 1.8 cm on image 51. Index left anterior pelvic subcutaneous nodule measures 2.4 cm on image 68. Subcutaneous nodules within the inguinal regions bilaterally. Large right anterior subcutaneous nodule superiorly in the abdomen over the in the liver measures 2.8 cm on image 27.  Uterus and adnexa have an unremarkable unenhanced appearance. Urinary bladder unremarkable. Moderate stool burden throughout the colon. Small bowel is decompressed. Aorta is normal caliber.  No acute bony abnormality or focal bone lesion.  IMPRESSION: Extensive metastatic disease including in the low lower  lung zones bilaterally, bilateral adrenal glands, as well as numerous retroperitoneal and subcutaneous nodules.  Study is limited in evaluation of the solid organs without intravenous contrast.  Dominant mass in the right middle lobe at the right lung base.  Bibasilar atelectasis.   Electronically Signed   By: Rolm Baptise M.D.   On: 10/13/2014 16:49    Medications:  Scheduled: . clopidogrel  75 mg Oral Daily  . docusate sodium  100 mg Oral BID  . gabapentin  100 mg Oral TID  . heparin  5,000 Units Subcutaneous 3 times per day  . insulin aspart  0-15 Units Subcutaneous TID WC  . insulin glargine  35 Units Subcutaneous QHS  . ipratropium-albuterol  3 mL Nebulization TID  . levofloxacin (LEVAQUIN) IV  500 mg Intravenous Q24H  . metoprolol tartrate  25 mg Oral TID  . multivitamin with minerals  1 tablet Oral Daily  . QUEtiapine  50 mg Oral QHS  . senna  1 tablet Oral BID   Continuous: . sodium chloride 75 mL/hr at 10/12/14 1836  . amiodarone 30 mg/hr (10/14/14 1400)   AST:[id number]  **OR** acetaminophen, albuterol, ALPRAZolam, alteplase, alum & mag hydroxide-simeth, bisacodyl, Cold Pack, heparin lock flush, heparin lock flush, morphine injection, ondansetron **OR** ondansetron (ZOFRAN) IV, oxyCODONE, polyethylene glycol, sodium chloride, sodium chloride  Assessment/Plan:  Active Problems:   Metastatic lung cancer (metastasis from lung to other site)   DM (diabetes mellitus)   HTN (hypertension)   UTI (lower urinary tract infection)   Supraventricular tachycardia   PAF (paroxysmal atrial fibrillation)   Ventricular tachycardia    Metastatic NSSLC New brain mets on brain MRI. CT abd/pelvis revealed intra-abdominal mets as well. Received Chemo with Gemzar and Carboplatin 11/4. Oncology following. Radiation to brain to be pursued at later date.  SVT/PAF Cardiology following and on Amiodarone.   UTI Cont on levaquin. 11/1 Urine cx with 75,000 multiple morphotypes present, none predominant.   Leukocytosis WBC remains elevated. Possibly due to to malignancy. No obvious infectious source apart from UTI. And UTI did not appear to be that significant. Monitor for now.   Essential HTN Stable, controlled. Continue current regimen.  Diabetes mellitus Type 2 Continue Lantus and SSI.   DVT Prophylaxis: Heparin SQ    Code Status: Full Code  Family Communication: Discussed with patient  Disposition Plan: Unclear for now.    LOS: 3 days   [facility] Pager [phone] 10/14/2014, 11:46 AM  If 8PM-8AM, please contact night-coverage at [email or web address], password [facility]

## 2014-10-14 NOTE — Progress Notes (Signed)
This morning, patient's HR started going up in the 150's-160's. Patient just sitting the the bed. It did go into a.fib for a few seconds and then went back into ST. BP was 121/75. NP made aware and RN gave patient morning dose of Cardizem. HR now in the 90s' NSR. Will continue to monitor.

## 2014-10-14 NOTE — Plan of Care (Signed)
Problem: Phase III Progression Outcomes Goal: Tolerating diet Outcome: Completed/Met Date Met:  10/14/14     

## 2014-10-14 NOTE — Progress Notes (Signed)
Patient tolerated first dose of Gemzar/Carboplatin without difficulty per new L FA IV site.  Pt slept through infusions.  Will require much reinforcement with chemo education.  See Education documentation.  4th floor nursing staff educated on chemo precautions/signage placed on door.

## 2014-10-14 NOTE — Progress Notes (Signed)
Subjective: The patient is seen and examined today. She is feeling fine this morning with no specific complaints. She had an episode of atrial fibrillation and was seen by Dr. Ron Parker and started on treatment with amiodarone. She is feeling better this morning. She was seen yesterday by Dr. Pablo Ledger for consideration of radiotherapy to the brain lesion. Dr. Pablo Ledger decided to hold on this treatment for now because of the aggressive systemic disease.   Objective: Vital signs in last 24 hours: Temp:  [98.4 F (36.9 C)-98.9 F (37.2 C)] 98.9 F (37.2 C) (11/04 0500) Pulse Rate:  [79-160] 79 (11/04 0821) Resp:  [18-20] 18 (11/04 0804) BP: (109-131)/(56-83) 131/77 mmHg (11/04 0821) SpO2:  [92 %-100 %] 98 % (11/04 0758)  Intake/Output from previous day: 11/03 0701 - 11/04 0700 In: 2550 [P.O.:900; I.V.:1350; IV Piggyback:300] Out: 1600 [Urine:1600] Intake/Output this shift:    General appearance: alert, cooperative, fatigued and mild distress Resp: rhonchi bilaterally and wheezes bilaterally Cardio: regular rate and rhythm, S1, S2 normal, no murmur, click, rub or gallop GI: soft, non-tender; bowel sounds normal; no masses,  no organomegaly Extremities: edema 2+  Lab Results:   Recent Labs  10/12/14 0321 10/14/14 0345  WBC 21.0* 22.6*  HGB 13.6 12.7  HCT 41.1 39.1  PLT 204 171   BMET  Recent Labs  10/12/14 0321 10/14/14 0345  NA 137 131*  K 4.0 4.1  CL 94* 92*  CO2 33* 28  GLUCOSE 422* 278*  BUN 13 15  CREATININE 0.73 0.70  CALCIUM 10.0 9.7    Studies/Results: Ct Abdomen Pelvis Wo Contrast  10/13/2014   CLINICAL DATA:  Newly diagnosed metastatic lung cancer. No abdominal plate.  EXAM: CT ABDOMEN AND PELVIS WITHOUT CONTRAST  TECHNIQUE: Multidetector CT imaging of the abdomen and pelvis was performed following the standard protocol without IV contrast.  COMPARISON:  Chest CT 10/11/2014  FINDINGS: Right middle lobe mass at the right lung base again noted, measuring up to  3.8 cm compared with 3.6 cm. The slight change likely related to differences in scan planes. Other smaller bilateral lower lobe nodules are noted as measured on recent chest CT. Small right pleural effusion. Atelectasis in the right lower lobe and right middle lobe at the right lung base. Heart is normal size.  Numerous subcutaneous and chest wall nodules noted, the largest in the left lower chest wall measuring up to 2.4 cm, stable.  3.2 cm right adrenal mass. Left adrenal mass measures up to 4.1 cm. These slight differences likely reflect slight differences in scan planes. No definite focal hepatic abnormality on this unenhanced study. Spleen, pancreas have an unremarkable unenhanced appearance.  Innumerable retroperitoneal nodules and masses. Index left retroperitoneal nodule on image 45 measures 2.4 cm. Numerous other small retroperitoneal/perinephric nodules bilaterally. Numerous subcutaneous nodules throughout the subcutaneous soft tissues of the abdomen and pelvis. Index right lateral subcutaneous nodule measures 1.8 cm on image 51. Index left anterior pelvic subcutaneous nodule measures 2.4 cm on image 68. Subcutaneous nodules within the inguinal regions bilaterally. Large right anterior subcutaneous nodule superiorly in the abdomen over the in the liver measures 2.8 cm on image 27.  Uterus and adnexa have an unremarkable unenhanced appearance. Urinary bladder unremarkable. Moderate stool burden throughout the colon. Small bowel is decompressed. Aorta is normal caliber.  No acute bony abnormality or focal bone lesion.  IMPRESSION: Extensive metastatic disease including in the low lower lung zones bilaterally, bilateral adrenal glands, as well as numerous retroperitoneal and subcutaneous nodules.  Study is  limited in evaluation of the solid organs without intravenous contrast.  Dominant mass in the right middle lobe at the right lung base.  Bibasilar atelectasis.   Electronically Signed   By: Rolm Baptise  M.D.   On: 10/13/2014 16:49    Medications: I have reviewed the patient's current medications.  Assessment/Plan: This is a very pleasant 53 years old African-American female with metastatic non-small cell lung cancer, squamous cell carcinoma with extensive disease involving the lung, adrenals, subcutaneous nodules as well as retroperitoneal lymphadenopathy and brain metastasis. I had a lengthy discussion with the patient this morning about her condition. I recommended for her to start systemic chemotherapy with carboplatin for AUC of 5 on day 1 and gemcitabine 1000 MG/M2 on days 1 and 8 every 3 weeks. I discussed with the patient adverse effect of this treatment including but not limited to alopecia, myelosuppression, nausea and vomiting, peripheral neuropathy, liver or renal dysfunction. We will hold on treating the small brain lesions for now until she has some control of her systemic disease. She agreed to proceed with the chemotherapy and expected to start the first cycle of this treatment today. She would have a chemotherapy education class before starting the first cycle of her treatment. I will continue to monitor the patient closely during her treatment. For the atrial fibrillation, continue the current treatment under the care of Dr. Ron Parker.   LOS: 3 days    Jasenia Weilbacher K. 10/14/2014

## 2014-10-14 NOTE — Plan of Care (Signed)
Problem: Phase II Progression Outcomes Goal: Ventricular heart rate < 100/min Outcome: Completed/Met Date Met:  10/14/14 Goal: Anticoagulation Therapy per MD order Outcome: Completed/Met Date Met:  10/14/14 Goal: CV Risk Factors identified Outcome: Completed/Met Date Met:  10/14/14 Goal: Pain controlled Outcome: Completed/Met Date Met:  10/14/14 Goal: Progress activity as tolerated unless otherwise ordered Outcome: Progressing Goal: Discharge plan established Outcome: Completed/Met Date Met:  10/14/14 Goal: Tolerating diet Outcome: Completed/Met Date Met:  10/14/14

## 2014-10-15 ENCOUNTER — Other Ambulatory Visit: Payer: Self-pay | Admitting: Radiation Therapy

## 2014-10-15 ENCOUNTER — Inpatient Hospital Stay (HOSPITAL_COMMUNITY): Payer: Medicaid Other

## 2014-10-15 DIAGNOSIS — I480 Paroxysmal atrial fibrillation: Secondary | ICD-10-CM | POA: Insufficient documentation

## 2014-10-15 DIAGNOSIS — C7931 Secondary malignant neoplasm of brain: Secondary | ICD-10-CM

## 2014-10-15 DIAGNOSIS — E118 Type 2 diabetes mellitus with unspecified complications: Secondary | ICD-10-CM | POA: Insufficient documentation

## 2014-10-15 LAB — GLUCOSE, CAPILLARY
GLUCOSE-CAPILLARY: 312 mg/dL — AB (ref 70–99)
GLUCOSE-CAPILLARY: 304 mg/dL — AB (ref 70–99)
Glucose-Capillary: 305 mg/dL — ABNORMAL HIGH (ref 70–99)

## 2014-10-15 LAB — CBC
HCT: 39.8 % (ref 36.0–46.0)
HEMOGLOBIN: 13.3 g/dL (ref 12.0–15.0)
MCH: 23.5 pg — ABNORMAL LOW (ref 26.0–34.0)
MCHC: 33.4 g/dL (ref 30.0–36.0)
MCV: 70.3 fL — ABNORMAL LOW (ref 78.0–100.0)
Platelets: 213 10*3/uL (ref 150–400)
RBC: 5.66 MIL/uL — ABNORMAL HIGH (ref 3.87–5.11)
RDW: 14 % (ref 11.5–15.5)
WBC: 25.1 10*3/uL — AB (ref 4.0–10.5)

## 2014-10-15 LAB — BASIC METABOLIC PANEL
ANION GAP: 11 (ref 5–15)
BUN: 18 mg/dL (ref 6–23)
CO2: 27 mEq/L (ref 19–32)
Calcium: 9.8 mg/dL (ref 8.4–10.5)
Chloride: 96 mEq/L (ref 96–112)
Creatinine, Ser: 0.79 mg/dL (ref 0.50–1.10)
Glucose, Bld: 329 mg/dL — ABNORMAL HIGH (ref 70–99)
Potassium: 4.8 mEq/L (ref 3.7–5.3)
Sodium: 134 mEq/L — ABNORMAL LOW (ref 137–147)

## 2014-10-15 LAB — PRO B NATRIURETIC PEPTIDE: Pro B Natriuretic peptide (BNP): 561.6 pg/mL — ABNORMAL HIGH (ref 0–125)

## 2014-10-15 MED ORDER — INSULIN GLARGINE 100 UNIT/ML ~~LOC~~ SOLN
10.0000 [IU] | Freq: Every day | SUBCUTANEOUS | Status: DC
  Administered 2014-10-15: 10 [IU] via SUBCUTANEOUS
  Filled 2014-10-15 (×2): qty 0.1

## 2014-10-15 MED ORDER — INSULIN ASPART 100 UNIT/ML ~~LOC~~ SOLN
0.0000 [IU] | Freq: Every day | SUBCUTANEOUS | Status: DC
  Administered 2014-10-15: 4 [IU] via SUBCUTANEOUS
  Administered 2014-10-16: 3 [IU] via SUBCUTANEOUS
  Administered 2014-10-17 – 2014-10-24 (×3): 2 [IU] via SUBCUTANEOUS

## 2014-10-15 MED ORDER — QUETIAPINE FUMARATE 50 MG PO TABS
50.0000 mg | ORAL_TABLET | Freq: Once | ORAL | Status: AC
  Administered 2014-10-15: 50 mg via ORAL
  Filled 2014-10-15: qty 1

## 2014-10-15 MED ORDER — MORPHINE SULFATE 4 MG/ML IJ SOLN
4.0000 mg | INTRAMUSCULAR | Status: DC | PRN
  Administered 2014-10-15 – 2014-10-23 (×7): 4 mg via INTRAVENOUS
  Filled 2014-10-15 (×9): qty 1

## 2014-10-15 MED ORDER — OXYCODONE HCL 5 MG PO TABS
10.0000 mg | ORAL_TABLET | ORAL | Status: DC | PRN
  Administered 2014-10-15 – 2014-10-26 (×43): 10 mg via ORAL
  Filled 2014-10-15 (×46): qty 2

## 2014-10-15 MED ORDER — INSULIN ASPART 100 UNIT/ML ~~LOC~~ SOLN
0.0000 [IU] | Freq: Three times a day (TID) | SUBCUTANEOUS | Status: DC
  Administered 2014-10-15: 15 [IU] via SUBCUTANEOUS
  Administered 2014-10-16: 7 [IU] via SUBCUTANEOUS
  Administered 2014-10-16 (×2): 15 [IU] via SUBCUTANEOUS
  Administered 2014-10-17: 11 [IU] via SUBCUTANEOUS
  Administered 2014-10-17: 7 [IU] via SUBCUTANEOUS
  Administered 2014-10-18: 4 [IU] via SUBCUTANEOUS
  Administered 2014-10-18 – 2014-10-19 (×2): 7 [IU] via SUBCUTANEOUS
  Administered 2014-10-19: 11 [IU] via SUBCUTANEOUS
  Administered 2014-10-20 (×2): 3 [IU] via SUBCUTANEOUS
  Administered 2014-10-20 – 2014-10-21 (×2): 4 [IU] via SUBCUTANEOUS
  Administered 2014-10-21 – 2014-10-22 (×2): 3 [IU] via SUBCUTANEOUS
  Administered 2014-10-22: 7 [IU] via SUBCUTANEOUS
  Administered 2014-10-22: 4 [IU] via SUBCUTANEOUS
  Administered 2014-10-23 (×2): 7 [IU] via SUBCUTANEOUS
  Administered 2014-10-23: 4 [IU] via SUBCUTANEOUS
  Administered 2014-10-24 – 2014-10-25 (×4): 7 [IU] via SUBCUTANEOUS
  Administered 2014-10-25: 4 [IU] via SUBCUTANEOUS
  Administered 2014-10-25: 7 [IU] via SUBCUTANEOUS
  Administered 2014-10-26: 4 [IU] via SUBCUTANEOUS

## 2014-10-15 MED ORDER — SODIUM CHLORIDE 0.9 % IJ SOLN
10.0000 mL | INTRAMUSCULAR | Status: DC | PRN
  Administered 2014-10-18: 10 mL
  Administered 2014-10-18: 20 mL
  Administered 2014-10-19: 40 mL
  Administered 2014-10-19 – 2014-10-24 (×6): 10 mL
  Administered 2014-10-25: 20 mL
  Administered 2014-10-25 – 2014-10-26 (×2): 10 mL
  Filled 2014-10-15 (×12): qty 40

## 2014-10-15 MED ORDER — PROMETHAZINE HCL 25 MG/ML IJ SOLN
12.5000 mg | Freq: Once | INTRAMUSCULAR | Status: AC
  Administered 2014-10-15: 12.5 mg via INTRAVENOUS
  Filled 2014-10-15: qty 1

## 2014-10-15 MED ORDER — DOCUSATE SODIUM 100 MG PO CAPS
100.0000 mg | ORAL_CAPSULE | Freq: Once | ORAL | Status: AC
  Administered 2014-10-15: 100 mg via ORAL
  Filled 2014-10-15: qty 1

## 2014-10-15 MED ORDER — SENNA 8.6 MG PO TABS
1.0000 | ORAL_TABLET | Freq: Once | ORAL | Status: AC
  Administered 2014-10-15: 8.6 mg via ORAL
  Filled 2014-10-15 (×2): qty 1

## 2014-10-15 MED ORDER — INSULIN ASPART 100 UNIT/ML ~~LOC~~ SOLN
3.0000 [IU] | Freq: Three times a day (TID) | SUBCUTANEOUS | Status: DC
  Administered 2014-10-15 – 2014-10-26 (×21): 3 [IU] via SUBCUTANEOUS

## 2014-10-15 MED ORDER — GABAPENTIN 100 MG PO CAPS
100.0000 mg | ORAL_CAPSULE | Freq: Once | ORAL | Status: AC
  Administered 2014-10-15: 100 mg via ORAL
  Filled 2014-10-15: qty 1

## 2014-10-15 MED ORDER — FUROSEMIDE 10 MG/ML IJ SOLN
40.0000 mg | Freq: Once | INTRAMUSCULAR | Status: AC
  Administered 2014-10-15: 40 mg via INTRAVENOUS
  Filled 2014-10-15: qty 4

## 2014-10-15 NOTE — Progress Notes (Signed)
[facility] PROGRESS NOTE  Sabrina Adkins UKG:[id number] DOB: 11-May-1961 DOA: 10/11/2014  PCP: Philis Fendt, MD  Brief HPI: Sabrina Adkins is a 53 y.o. 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. She presented with painful multiple subcutaneous nodules beneath her breasts, and the anterior and shoulder area and upper abdominal wall. CT chest angiogram revealed rapid progression of her lung cancer, withsubcutaneous nodules actually being metastatic disease, but having intra-abdominal metastasis, as well. She was admitted for chemotherapy. Patient then developed supraventricular tachycardia with heart rate being in the 170s. Seen by cardiology and started on Amiodarone.   Past medical history:  Past Medical History  Diagnosis Date  . Diabetes mellitus without complication   . Anxiety   . Clotting disorder     right leg DVT  . Coagulopathy 09/02/2014  . Hypertension   . Shortness of breath     "when I am getting ready to lay down"  . Pneumonia     2015  . Depression   . Bipolar disorder   . GERD (gastroesophageal reflux disease)   . Arthritis     RA  . DVT (deep venous thrombosis)     > 10 years ago, was on coumadin  . Cancer 10/12/2014    metastatic  lung    Consultants: Oncology, Cardiology  Procedures:  2 D ECHO Study Conclusions - Left ventricle: The cavity size was normal. Wall thickness wasnormal. Systolic function was normal. The estimated ejectionfraction was in the range of 55% to 60%. Wall motion was normal;there were no regional wall motion abnormalities. Leftventricular diastolic function parameters were normal. - Pericardium, extracardiac: A trivial pericardial effusion wasidentified.  Antibiotics: None  Subjective: Patient feels slightly short of breath. Denies any chest  pain. No dizziness. No headaches.   Objective: Vital Signs  Filed Vitals:   10/14/14 2000 10/14/14 2102 10/15/14 0512 10/15/14 0748  BP:  125/64 131/70   Pulse:  86 85   Temp:  98.4 F (36.9 C) 98.3 F (36.8 C)   TempSrc:  Oral Oral   Resp:  24 24   Height:      Weight:      SpO2: 95% 92% 92% 98%    Intake/Output Summary (Last 24 hours) at 10/15/14 0856 Last data filed at 10/15/14 0654  Gross per 24 hour  Intake 4900.98 ml  Output   1600 ml  Net 3300.98 ml   Filed Weights   10/11/14 2029 10/12/14 0440 10/12/14 1722  Weight: 113.717 kg (250 lb 11.2 oz) 113.7 kg (250 lb 10.6 oz) 113 kg (249 lb 1.9 oz)    General appearance: alert, cooperative, appears stated age and no distress Resp: decreased air entry at bases. No defiite crackles.  Cardio: regular rate and rhythm, S1, S2 normal, no murmur, click, rub or gallop GI: soft, non-tender; bowel sounds normal; no masses,  no organomegaly Neurologic: no focal deficits  Lab Results:  Basic Metabolic Panel:  Recent Labs Lab 10/11/14 1154 10/12/14 0321 10/14/14 0345 10/15/14 0514  NA 132* 137 131* 134*  K 3.9 4.0 4.1 4.8  CL 90* 94* 92* 96  CO2 29 33* 28 27  GLUCOSE 349* 422* 278* 329*  BUN 12 13 15 18   CREATININE 0.64 0.73 0.70 0.79  CALCIUM 10.2 10.0 9.7 9.8   Liver Function Tests:  Recent Labs Lab 10/11/14 1154  AST 17  ALT 17  ALKPHOS 67  BILITOT 0.6  PROT 7.2  ALBUMIN 3.6   CBC:  Recent Labs Lab 10/11/14 1154 10/12/14 0321 10/14/14 0345 10/15/14 0655  WBC 19.5* 21.0* 22.6* 25.1*  NEUTROABS 15.2*  --   --   --   HGB 14.4 13.6 12.7 13.3  HCT 42.8 41.1 39.1 39.8  MCV 71.1* 70.7* 71.5* 70.3*  PLT 218 204 171 213   CBG:  Recent Labs Lab 10/14/14 0728 10/14/14 1201 10/14/14 1705 10/14/14 2059 10/15/14 0734  GLUCAP 255* 330* 340* 317* 312*    Recent Results (from the past 240 hour(s))  Urine culture     Status: None   Collection Time: 10/11/14 12:33 PM  Result Value Ref Range Status    Specimen Description URINE, CLEAN CATCH  Final   Special Requests NONE  Final   Culture  Setup Time   Final    10/12/2014 03:08 Performed at [place]   Final    75,000 COLONIES/ML Performed at [facility]    Culture   Final    Multiple bacterial morphotypes present, none predominant. Suggest appropriate recollection if clinically indicated. Performed at [facility]    Report Status 10/13/2014 FINAL  Final  MRSA PCR Screening     Status: None   Collection Time: 10/12/14 10:29 AM  Result Value Ref Range Status   MRSA by PCR NEGATIVE NEGATIVE Final    Comment:        The GeneXpert MRSA Assay (FDA approved for NASAL specimens only), is one component of a comprehensive MRSA colonization surveillance program. It is not intended to diagnose MRSA infection nor to guide or monitor treatment for MRSA infections.       Studies/Results: Ct Abdomen Pelvis Wo Contrast  10/13/2014   CLINICAL DATA:  Newly diagnosed metastatic lung cancer. No abdominal plate.  EXAM: CT ABDOMEN AND PELVIS WITHOUT CONTRAST  TECHNIQUE: Multidetector CT imaging of the abdomen and pelvis was performed following the standard protocol without IV contrast.  COMPARISON:  Chest CT 10/11/2014  FINDINGS: Right middle lobe mass at the right lung base again noted, measuring up to 3.8 cm compared with 3.6 cm. The slight change likely related to differences in scan planes. Other smaller bilateral lower lobe nodules are noted as measured on recent chest CT. Small right pleural effusion. Atelectasis in the right lower lobe and right middle lobe at the right lung base. Heart is normal size.  Numerous subcutaneous and chest wall nodules noted, the largest in the left lower chest wall measuring up to 2.4 cm, stable.  3.2 cm right adrenal mass. Left adrenal mass measures up to 4.1 cm. These slight differences likely reflect slight differences in scan planes. No definite focal hepatic  abnormality on this unenhanced study. Spleen, pancreas have an unremarkable unenhanced appearance.  Innumerable retroperitoneal nodules and masses. Index left retroperitoneal nodule on image 45 measures 2.4 cm. Numerous other small retroperitoneal/perinephric nodules bilaterally. Numerous subcutaneous nodules throughout the subcutaneous soft tissues of the abdomen and pelvis. Index right lateral subcutaneous nodule measures 1.8 cm on image 51. Index left anterior pelvic subcutaneous nodule measures 2.4 cm on image 68. Subcutaneous nodules within the inguinal regions bilaterally. Large right anterior subcutaneous nodule superiorly in the abdomen over the in the liver measures 2.8 cm on image 27.  Uterus and adnexa have an unremarkable unenhanced appearance. Urinary bladder unremarkable. Moderate stool burden throughout the colon. Small bowel is decompressed. Aorta is normal caliber.  No acute bony abnormality or focal bone lesion.  IMPRESSION: Extensive metastatic disease including in the low lower lung zones bilaterally, bilateral adrenal glands, as well as numerous retroperitoneal and subcutaneous nodules.  Study is limited in evaluation of the solid organs without intravenous contrast.  Dominant mass in the right middle lobe at the right lung base.  Bibasilar atelectasis.   Electronically Signed   By: Rolm Baptise M.D.   On: 10/13/2014 16:49    Medications:  Scheduled: . clopidogrel  75 mg Oral Daily  . docusate sodium  100 mg Oral BID  . gabapentin  100 mg Oral TID  . heparin  5,000 Units Subcutaneous 3 times per day  . insulin aspart  0-15 Units Subcutaneous TID WC  . insulin glargine  10 Units Subcutaneous Daily  . insulin glargine  35 Units Subcutaneous QHS  . ipratropium-albuterol  3 mL Nebulization TID  . levofloxacin (LEVAQUIN) IV  500 mg Intravenous Q24H  . metoprolol tartrate  25 mg Oral TID  . multivitamin with minerals  1 tablet Oral Daily  . QUEtiapine  50 mg Oral QHS  . senna  1  tablet Oral BID   Continuous: . sodium chloride 75 mL/hr at 10/15/14 0649  . amiodarone 30 mg/hr (10/15/14 7341)   PFX:[id number] **OR** acetaminophen, albuterol, ALPRAZolam, alum & mag hydroxide-simeth, bisacodyl, morphine injection, ondansetron **OR** ondansetron (ZOFRAN) IV, oxyCODONE, polyethylene glycol, sodium chloride, sodium chloride  Assessment/Plan:  Active Problems:   Metastatic lung cancer (metastasis from lung to other site)   DM (diabetes mellitus)   HTN (hypertension)   UTI (lower urinary tract infection)   Supraventricular tachycardia   PAF (paroxysmal atrial fibrillation)   Ventricular tachycardia   Metastatic disease    Metastatic [facility] New brain mets on brain MRI. CT abd/pelvis revealed intra-abdominal mets as well. Received Chemo with Gemzar and Carboplatin 11/4. Oncology following. Radiation to brain to be pursued at later date.  SVT/PAF Cardiology following and on Amiodarone.   UTI Cont on levaquin. 11/1 Urine cx with 75,000 multiple morphotypes present, none predominant.   Leukocytosis WBC continues to climb. She remains afebrile. Will appreciate Onc input in this matter. Possibly due to to malignancy. No obvious infectious source apart from UTI. And UTI did not appear to be that significant. Unclear if she received any steroids or marrow stimulant recently.  Essential HTN Stable, controlled. Continue current regimen.  Diabetes mellitus Type 2 Poorly controlled. Add Am Lantus as well. Continue SSI but change to resistent. A1c was 9.7 in September.  DVT Prophylaxis: Heparin SQ    Code Status: Full Code  Family Communication: Discussed with patient  Disposition Plan: Unclear for now. PT/OT attempting to see patient.     LOS: 4 days   [facility] Pager [phone] 10/15/2014, 8:56 AM  If 8PM-8AM, please contact night-coverage at [email or web address], password [facility]

## 2014-10-15 NOTE — Progress Notes (Signed)
Received alert from CMT that patient had 16 beat run of v-tach with HR up to 180s nonsustained at 0925. RN checked on patient and found that she was also having intermittent sharp chest pain. EKG performed. BP stable. Dr. Harrington Challenger made aware, no new orders at this time. Will continue to monitor patient.

## 2014-10-15 NOTE — Evaluation (Addendum)
Physical Therapy Evaluation Patient Details Name: Sabrina Adkins MRN: [id number] DOB: 10/13/1961 Today's Date: 10/15/2014   History of Present Illness  53 yo female admitted with metastatic lung cancer, new brain lesion, SVT, PAF. Hx of DM, anxiety, clotting d/o, HTN, bipolar d/o, DVT, recent diagnosis of lung cancer.   Clinical Impression  On eval, pt was Min guard assist for transfer from bed to chair. Did not ambulate per nurse recommendations today (RN was okay with bed to chair today). Will attempt ambulation on next visit, if able. Pt states she does not have family here and her plan is to return home -to [place]? from here. At this time, recommend HHPT. May need to consider SNF placement if pt does not improve.     Follow Up Recommendations Home health PT (may need to consider SNF if mobility/activity tolerance does not improve. )    Equipment Recommendations   (to be determined)    Recommendations for Other Services OT consult     Precautions / Restrictions Precautions Precautions: Fall      Mobility  Bed Mobility Overal bed mobility: Needs Assistance Bed Mobility: Supine to Sit     Supine to sit: Min guard     General bed mobility comments: close guard for safety  Transfers Overall transfer level: Needs assistance   Transfers: Sit to/from Stand;Stand Pivot Transfers Sit to Stand: Min guard Stand pivot transfers: Min guard       General transfer comment: close guard for safety. Increased time. Tolerated fairly well.   Ambulation/Gait             General Gait Details: NT-RN suggested PT hold off on ambulation today.  Stairs            Wheelchair Mobility    Modified Rankin (Stroke Patients Only)       Balance                                             Pertinent Vitals/Pain Pain Assessment: Faces Pain Score: 4  Pain Location: eye Pain Intervention(s): Monitored during session HR: 101 bpm start of session; 96 bpm end  of session    Home Living Family/patient expects to be discharged to:: Private residence Living Arrangements: Alone   Type of Home: Apartment Home Access: Stairs to enter   [employer] of Steps: 2 flights Home Layout: One level Home Equipment: None      Prior Function Level of Independence: Independent               Hand Dominance        Extremity/Trunk Assessment   Upper Extremity Assessment: Defer to OT evaluation           Lower Extremity Assessment: Generalized weakness      Cervical / Trunk Assessment: Normal  Communication   Communication: No difficulties  Cognition Arousal/Alertness: Awake/alert Behavior During Therapy: WFL for tasks assessed/performed Overall Cognitive Status: Within Functional Limits for tasks assessed                      General Comments      Exercises        Assessment/Plan    PT Assessment Patient needs continued PT services  PT Diagnosis Difficulty walking;Generalized weakness;Acute pain   PT Problem List Decreased strength;Decreased activity tolerance;Decreased balance;Decreased mobility;Obesity;Pain  PT Treatment Interventions DME instruction;Gait training;Functional  mobility training;Therapeutic activities;Therapeutic exercise;Patient/family education;Balance training   PT Goals (Current goals can be found in the Care Plan section) Acute Rehab PT Goals Patient Stated Goal: to return to [place]? PT Goal Formulation: With patient Time For Goal Achievement: 10/29/14 Potential to Achieve Goals: Fair    Frequency Min 3X/week   Barriers to discharge        Co-evaluation               End of Session   Activity Tolerance: Patient tolerated treatment well Patient left: in chair;with call bell/phone within reach           Time: 0853-0904 PT Time Calculation (min): 11 min   Charges:   PT Evaluation $Initial PT Evaluation Tier I: 1 Procedure PT Treatments $Therapeutic Activity:  8-22 mins   PT G Codes:          Weston Anna, MPT Pager: [phone]

## 2014-10-15 NOTE — Progress Notes (Signed)
Inpatient Diabetes Program Recommendations  AACE/ADA: New Consensus Statement on Inpatient Glycemic Control (2013)  Target Ranges:  Prepandial:   less than 140 mg/dL      Peak postprandial:   less than 180 mg/dL (1-2 hours)      Critically ill patients:  140 - 180 mg/dL   Reason for Visit: Hyperglycemia  Results for LARUEN, RISSER (MRN [id number]) as of 10/15/2014 11:54  Ref. Range 10/14/2014 07:28 10/14/2014 12:01 10/14/2014 17:05 10/14/2014 20:59 10/15/2014 07:34  Glucose-Capillary Latest Range: 70-99 mg/dL 255 (H) 330 (H) 340 (H) 317 (H) 312 (H)   Blood sugars consistently in the 300s. Eating 100%.  Recommendations: Add Novolog 4 units tidwc for meal coverage insulin if pt eats >50% meal Increase Novolog to resistant tidwc and hs. Will likely need medication adjustment prior to discharge.  Will continue to follow. Thank you. Lorenda Peck, RD, LDN, CDE Inpatient Diabetes Coordinator [phone]

## 2014-10-15 NOTE — Progress Notes (Signed)
Pt would benefit from central access due to difficulty obtaining IV access.   Also the Amiodarone and chemotherapy she is receiving.  Thanks.

## 2014-10-15 NOTE — Progress Notes (Signed)
Contacted by nurse re afib and NSVT. Reviewed labs  Will give lasix  IV  X 1 Will increase amiodarone gtt to 60 mg /hour.

## 2014-10-15 NOTE — Progress Notes (Signed)
OT Cancellation Note  Patient Details Name: Vedanshi Massaro MRN: [id number] DOB: 24-Jun-1961   Cancelled Treatment:    Reason Eval/Treat Not Completed: Medical issues which prohibited therapy. Spoke to nursing who requests that OT hold off right now on seeing pt. Will try back later time.  Jules Schick  [phone] 10/15/2014, 10:06 AM

## 2014-10-15 NOTE — Progress Notes (Signed)
Subjective: Breathing is a little short.  No CP  Sensed palpitations last night.    [place] for breathing treatment.   Objective: Filed Vitals:   10/14/14 1435 10/14/14 2000 10/14/14 2102 10/15/14 0512  BP:   125/64 131/70  Pulse:   86 85  Temp:   98.4 F (36.9 C) 98.3 F (36.8 C)  TempSrc:   Oral Oral  Resp:   24 24  Height:      Weight:      SpO2: 95% 95% 92% 92%   Weight change:   Intake/Output Summary (Last 24 hours) at 10/15/14 0719 Last data filed at 10/15/14 0654  Gross per 24 hour  Intake 4900.98 ml  Output   1600 ml  Net 3300.98 ml   Net i/o  (? Complete)  +5.7 L  General: Alert, awake, oriented x3, in no acute distress Neck:  JVP is normal Heart: Gr II/VI systolic murmujr   Crisp valve sounds.   Lungs: Clear to auscultation.  No rales or wheezes. Exemities:  No edema.   Neuro: Grossly intact, nonfocal. \ Tele:  SR  Short bursts of atrial fib lasting about 20 min last night.    Lab Results: Results for orders placed or performed during the hospital encounter of 10/11/14 (from the past 24 hour(s))  Glucose, capillary     Status: Abnormal   Collection Time: 10/14/14  7:28 AM  Result Value Ref Range   Glucose-Capillary 255 (H) 70 - 99 mg/dL   Comment 1 Notify RN    Comment 2 Documented in Chart   Glucose, capillary     Status: Abnormal   Collection Time: 10/14/14 12:01 PM  Result Value Ref Range   Glucose-Capillary 330 (H) 70 - 99 mg/dL   Comment 1 Notify RN    Comment 2 Documented in Chart   Glucose, capillary     Status: Abnormal   Collection Time: 10/14/14  5:05 PM  Result Value Ref Range   Glucose-Capillary 340 (H) 70 - 99 mg/dL   Comment 1 Notify RN    Comment 2 Documented in Chart   Glucose, capillary     Status: Abnormal   Collection Time: 10/14/14  8:59 PM  Result Value Ref Range   Glucose-Capillary 317 (H) 70 - 99 mg/dL  Basic metabolic panel     Status: Abnormal   Collection Time: 10/15/14  5:14 AM  Result Value Ref Range   Sodium 134  (L) 137 - 147 mEq/L   Potassium 4.8 3.7 - 5.3 mEq/L   Chloride 96 96 - 112 mEq/L   CO2 27 19 - 32 mEq/L   Glucose, Bld 329 (H) 70 - 99 mg/dL   BUN 18 6 - 23 mg/dL   Creatinine, Ser 0.79 0.50 - 1.10 mg/dL   Calcium 9.8 8.4 - 10.5 mg/dL   GFR calc non Af Amer >90 >90 mL/min   GFR calc Af Amer >90 >90 mL/min   Anion gap 11 5 - 15  CBC     Status: Abnormal   Collection Time: 10/15/14  6:55 AM  Result Value Ref Range   WBC 25.1 (H) 4.0 - 10.5 K/uL   RBC 5.66 (H) 3.87 - 5.11 MIL/uL   Hemoglobin 13.3 12.0 - 15.0 g/dL   HCT 39.8 36.0 - 46.0 %   MCV 70.3 (L) 78.0 - 100.0 fL   MCH 23.5 (L) 26.0 - 34.0 pg   MCHC 33.4 30.0 - 36.0 g/dL   RDW 14.0 11.5 - 15.5 %  Platelets 213 150 - 400 K/uL    Studies/Results: No results found.  Medications: REviewed   @PROBHOSP @  1.  Atrial fib/flutter.  Patinet senses.  SHort  Would continue IV amiodarone for now.    2.  Metastatic lung Ca 3.  HTN    4.  DM.    LOS: 4 days   Dorris Carnes 10/15/2014, 7:19 AM

## 2014-10-15 NOTE — Progress Notes (Signed)
Peripherally Inserted Central Catheter/Midline Placement  The IV Nurse has discussed with the patient and/or persons authorized to consent for the patient, the purpose of this procedure and the potential benefits and risks involved with this procedure.  The benefits include less needle sticks, lab draws from the catheter and patient may be discharged home with the catheter.  Risks include, but not limited to, infection, bleeding, blood clot (thrombus formation), and puncture of an artery; nerve damage and irregular heat beat.  Alternatives to this procedure were also discussed.  PICC/Midline Placement Documentation        Sabrina Adkins 10/15/2014, 5:30 PM

## 2014-10-16 ENCOUNTER — Ambulatory Visit (HOSPITAL_COMMUNITY): Admission: RE | Admit: 2014-10-16 | Payer: Medicaid Other | Source: Ambulatory Visit

## 2014-10-16 ENCOUNTER — Ambulatory Visit (HOSPITAL_COMMUNITY): Payer: Medicaid Other

## 2014-10-16 DIAGNOSIS — R079 Chest pain, unspecified: Secondary | ICD-10-CM

## 2014-10-16 LAB — CBC
HCT: 37.3 % (ref 36.0–46.0)
Hemoglobin: 12.5 g/dL (ref 12.0–15.0)
MCH: 23.4 pg — AB (ref 26.0–34.0)
MCHC: 33.5 g/dL (ref 30.0–36.0)
MCV: 69.9 fL — ABNORMAL LOW (ref 78.0–100.0)
Platelets: 184 10*3/uL (ref 150–400)
RBC: 5.34 MIL/uL — ABNORMAL HIGH (ref 3.87–5.11)
RDW: 13.9 % (ref 11.5–15.5)
WBC: 21.4 10*3/uL — ABNORMAL HIGH (ref 4.0–10.5)

## 2014-10-16 LAB — BASIC METABOLIC PANEL
Anion gap: 10 (ref 5–15)
BUN: 21 mg/dL (ref 6–23)
CALCIUM: 9.6 mg/dL (ref 8.4–10.5)
CO2: 29 mEq/L (ref 19–32)
Chloride: 91 mEq/L — ABNORMAL LOW (ref 96–112)
Creatinine, Ser: 0.78 mg/dL (ref 0.50–1.10)
GLUCOSE: 236 mg/dL — AB (ref 70–99)
POTASSIUM: 4.1 meq/L (ref 3.7–5.3)
SODIUM: 130 meq/L — AB (ref 137–147)

## 2014-10-16 LAB — GLUCOSE, CAPILLARY
GLUCOSE-CAPILLARY: 259 mg/dL — AB (ref 70–99)
Glucose-Capillary: 374 mg/dL — ABNORMAL HIGH (ref 70–99)
Glucose-Capillary: 246 mg/dL — ABNORMAL HIGH (ref 70–99)
Glucose-Capillary: 327 mg/dL — ABNORMAL HIGH (ref 70–99)
Glucose-Capillary: 305 mg/dL — ABNORMAL HIGH (ref 70–99)

## 2014-10-16 MED ORDER — PROMETHAZINE HCL 25 MG/ML IJ SOLN
12.5000 mg | Freq: Four times a day (QID) | INTRAMUSCULAR | Status: DC | PRN
  Administered 2014-10-17 – 2014-10-22 (×2): 12.5 mg via INTRAVENOUS
  Filled 2014-10-16 (×2): qty 1

## 2014-10-16 MED ORDER — INSULIN GLARGINE 100 UNIT/ML ~~LOC~~ SOLN
15.0000 [IU] | Freq: Every day | SUBCUTANEOUS | Status: DC
  Administered 2014-10-16 – 2014-10-18 (×3): 15 [IU] via SUBCUTANEOUS
  Filled 2014-10-16 (×3): qty 0.15

## 2014-10-16 NOTE — Progress Notes (Signed)
Patient ID: Sabrina Adkins, female   DOB: 12-29-1960, 53 y.o.   MRN: [id number]    SUBJECTIVE:  Patient is not having any chest pain. She does not feel any significant palpitations.   Filed Vitals:   10/15/14 1356 10/15/14 1526 10/15/14 2130 10/16/14 0602  BP: 127/57 114/80 95/66 97/64   Pulse: 90 84 72 93  Temp: 97.4 F (36.3 C)  97.5 F (36.4 C) 97.9 F (36.6 C)  TempSrc: Oral  Oral Oral  Resp: 24  24 22   Height:      Weight:      SpO2: 96%  96% 96%     Intake/Output Summary (Last 24 hours) at 10/16/14 0643 Last data filed at 10/16/14 0334  Gross per 24 hour  Intake 2731.89 ml  Output   2600 ml  Net 131.89 ml    LABS: Basic Metabolic Panel:  Recent Labs  10/15/14 0514 10/16/14 0438  NA 134* 130*  K 4.8 4.1  CL 96 91*  CO2 27 29  GLUCOSE 329* 236*  BUN 18 21  CREATININE 0.79 0.78  CALCIUM 9.8 9.6   Liver Function Tests: No results for input(s): AST, ALT, ALKPHOS, BILITOT, PROT, ALBUMIN in the last 72 hours. No results for input(s): LIPASE, AMYLASE in the last 72 hours. CBC:  Recent Labs  10/15/14 0655 10/16/14 0438  WBC 25.1* 21.4*  HGB 13.3 12.5  HCT 39.8 37.3  MCV 70.3* 69.9*  PLT 213 184   Cardiac Enzymes: No results for input(s): CKTOTAL, CKMB, CKMBINDEX, TROPONINI in the last 72 hours. BNP: Invalid input(s): POCBNP D-Dimer: No results for input(s): DDIMER in the last 72 hours. Hemoglobin A1C: No results for input(s): HGBA1C in the last 72 hours. Fasting Lipid Panel: No results for input(s): CHOL, HDL, LDLCALC, TRIG, CHOLHDL, LDLDIRECT in the last 72 hours. Thyroid Function Tests: No results for input(s): TSH, T4TOTAL, T3FREE, THYROIDAB in the last 72 hours.  Invalid input(s): FREET3  RADIOLOGY: Ct Abdomen Pelvis Wo Contrast  10/13/2014   CLINICAL DATA:  Newly diagnosed metastatic lung cancer. No abdominal plate.  EXAM: CT ABDOMEN AND PELVIS WITHOUT CONTRAST  TECHNIQUE: Multidetector CT imaging of the abdomen and pelvis was performed  following the standard protocol without IV contrast.  COMPARISON:  Chest CT 10/11/2014  FINDINGS: Right middle lobe mass at the right lung base again noted, measuring up to 3.8 cm compared with 3.6 cm. The slight change likely related to differences in scan planes. Other smaller bilateral lower lobe nodules are noted as measured on recent chest CT. Small right pleural effusion. Atelectasis in the right lower lobe and right middle lobe at the right lung base. Heart is normal size.  Numerous subcutaneous and chest wall nodules noted, the largest in the left lower chest wall measuring up to 2.4 cm, stable.  3.2 cm right adrenal mass. Left adrenal mass measures up to 4.1 cm. These slight differences likely reflect slight differences in scan planes. No definite focal hepatic abnormality on this unenhanced study. Spleen, pancreas have an unremarkable unenhanced appearance.  Innumerable retroperitoneal nodules and masses. Index left retroperitoneal nodule on image 45 measures 2.4 cm. Numerous other small retroperitoneal/perinephric nodules bilaterally. Numerous subcutaneous nodules throughout the subcutaneous soft tissues of the abdomen and pelvis. Index right lateral subcutaneous nodule measures 1.8 cm on image 51. Index left anterior pelvic subcutaneous nodule measures 2.4 cm on image 68. Subcutaneous nodules within the inguinal regions bilaterally. Large right anterior subcutaneous nodule superiorly in the abdomen over the in the liver measures 2.8 cm  on image 27.  Uterus and adnexa have an unremarkable unenhanced appearance. Urinary bladder unremarkable. Moderate stool burden throughout the colon. Small bowel is decompressed. Aorta is normal caliber.  No acute bony abnormality or focal bone lesion.  IMPRESSION: Extensive metastatic disease including in the low lower lung zones bilaterally, bilateral adrenal glands, as well as numerous retroperitoneal and subcutaneous nodules.  Study is limited in evaluation of the  solid organs without intravenous contrast.  Dominant mass in the right middle lobe at the right lung base.  Bibasilar atelectasis.   Electronically Signed   By: Rolm Baptise M.D.   On: 10/13/2014 16:49   Dg Chest 2 View  10/11/2014   CLINICAL DATA:  Chest pain  EXAM: CHEST  2 VIEW  COMPARISON:  CT from earlier in the same day  FINDINGS: Cardiac show remains elevated with right basilar atelectasis. A dominant right pulmonary nodule is noted as well as some smaller nodules similar to that seen on the prior CT examination. No sizable effusion is noted. No bony abnormality is seen.  IMPRESSION: Right basilar atelectasis with evidence of multiple pulmonary nodules.   Electronically Signed   By: Inez Catalina M.D.   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 Now with shortness of breath. History of DVT.  EXAM: CT ANGIOGRAPHY CHEST WITH CONTRAST  TECHNIQUE: Multidetector CT imaging of the chest was performed using the standard protocol during bolus administration of intravenous contrast. Multiplanar CT image reconstructions and MIPs were obtained to evaluate the vascular anatomy.  CONTRAST:  36mL OMNIPAQUE IOHEXOL 350 MG/ML SOLN  COMPARISON:  CT of the chest without contrast on 08/12/2014  FINDINGS: There has been significant progression of metastatic lung carcinoma in the chest and visualized upper abdomen. The dominant right middle lobe mass now measures approximately 2.9 x 3.6 cm compared to approximately 2.0 x 2.2 cm on the prior study. A number of metastatic pulmonary nodules are identified in both lungs. Anterior right upper lobe nodule measures 1.2 cm. Superior segment right lower lobe nodule measures 0.5 cm. Nodule abutting the lower major fissure measures 1.3 cm. Right lower lobe peripheral nodule measures 0.7 cm. Several other  smaller right lower lobe nodules present. There are at least 6 new pulmonary nodules in the left lower lobe with the largest measuring 1.1 cm. Stable 1.6 cm ground-glass opacity at the right lung apex.  Metastatic adenopathy in the mediastinum also shows significant progression since the prior CT. The dominant anterior mediastinal lymph node mass abutting both the ascending thoracic aorta and SVC now measures approximately 3.2 x 4.7 cm as compared to roughly 2.5 cm in short axis on the previous exam. Largest right peritracheal lymph node measures 1.6 cm in short axis. Subcarinal lymph node mass measures 2.6 x 3.5 cm. Prevascular lymph node mass anterior to the aortic arch shows enlargement and measures 1.5 cm in short axis. Bilateral axillary lymph nodes also show enlargement with the largest left axillary lymph node measuring 2.3 cm in short axis and the largest right axillary lymph node measuring 1.6 cm in short axis.  Multiple metastatic subcutaneous nodules are identified and are completely new since the prior examination. The largest subcutaneous nodule is in the right anterior lower costal subcutaneous fat and measures 2.8 cm in diameter. This was not present previously. A number of other smaller  subcutaneous nodules are present in the left anterior upper abdominal wall fat, fat abutting the chest wall laterally on the left and the subcutaneous fat overlying the left scapula.  The visualized upper abdomen also shows evidence of bilateral adrenal metastasis which were visualized previously. The dominant right adrenal metastasis now measures approximately 3.4 x 3.6 cm compared to 2.7 cm in diameter on the prior study. One or 2 separate left adrenal metastases measure in conglomerate dimensions approximately 2.5 x 3.8 cm. New 1.2 cm nodule posterior to the left upper kidney in the perinephric fat is consistent with metastasis.  No evidence of pulmonary embolism. The thoracic aorta is normal in caliber and shows  no evidence of dissection. Diffuse spondylosis of the thoracic spine present. There are no visualized bone metastases in the chest. No pleural effusions or pulmonary infiltrates. There is a small amount of pericardial fluid which appears more prominent compared to the prior CT.  Review of the MIP images confirms the above findings.  IMPRESSION: Marked progression of metastatic disease in the chest and upper abdomen, as detailed above. There is enlargement of the dominant right middle lobe lung carcinoma an a number of new metastatic nodules in both lungs. Metastatic adenopathy in the mediastinum and both axillary regions has increased in size since the prior CT. 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 There are a number of new subcutaneous metastatic nodules in the subcutaneous fat. New posterior left perinephric metastatic nodules also visualized. The patient presumably has not received any interval treatment since the prior CT. Recommend oncologic consultation. A formal PET scan would also be helpful to complete staging evaluation prior to initiation of any cancer treatment. There is no evidence of pulmonary embolism.  Findings were communicated directly to Dr. Canary Brim in the Emergency Department.   Electronically Signed   By: Aletta Edouard M.D.   On: 10/11/2014 14:30   Mr Jeri Cos VO Contrast  10/11/2014   CLINICAL DATA:  Initial encounter. New diagnosis lung cancer. Right-sided headache, 3 days duration. Staging.  EXAM: MRI HEAD WITHOUT AND WITH CONTRAST  TECHNIQUE: Multiplanar, multiecho pulse sequences of the brain and surrounding structures were obtained without and with intravenous contrast.  CONTRAST:  50mL MULTIHANCE GADOBENATE DIMEGLUMINE 529 MG/ML IV SOLN  COMPARISON:  None.  FINDINGS: There are 3 metastatic lesions noted affecting the brain. Within the right lateral cerebellum, there is a 7 mm metastasis. This contains some petechial blood products. At the right parietal vertex, there is  a 9 mm metastasis. This contains minimal petechial blood products. In the left posterior parietal lobe, there is a 5-6 mm metastasis.  No evidence of ischemic infarction. No evidence of chronic small vessel disease. No hydrocephalus. No extra-axial fluid collection. No calvarial lesion is seen.  In the right orbit, there is a well-circumscribed abnormality measuring approximately 1 cm in size. This probably represents a venous varix. I cannot be certain this does not represent a metastasis to the extra-ocular muscles (superior rectus).  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 Right cerebellum. Right parietal vertex. Left posterior parietal region. Early petechial blood products in the right cerebellar and right parietal lesions.  Orbital lesion on the right associated with the superior rectus muscle. This could represent a venous varix or metastasis. This could be studied with CT or dedicated orbital MRI should differentiation be important.   Electronically Signed   By: Nelson Chimes M.D.   On: 10/11/2014 19:36   Dg Chest [facility]  10/15/2014   CLINICAL DATA:  Dyspnea.  EXAM: PORTABLE CHEST - 1 VIEW  COMPARISON:  10/11/2014  FINDINGS: The cardiac silhouette remains enlarged. There is persistent elevation of right hemidiaphragm, not significantly changed. Right-sided lung nodules are again seen, measuring approximately 3.9 cm in the right base and 1.3 cm adjacent to the right hilum. Focal linear opacity in the lateral left mid lung is unchanged, likely platelike atelectasis. Right basilar atelectasis is again seen. No large pleural effusion is identified. No pneumothorax.  IMPRESSION: Stable appearance of the chest with right lung nodules/mass and bibasilar atelectasis.   Electronically Signed   By: Logan Bores   On: 10/15/2014 09:31   Dg Chest Port 1 View  09/23/2014   CLINICAL DATA:  Postop bronchoscopy.  EXAM: PORTABLE CHEST - 1 VIEW  COMPARISON:  08/12/2014   FINDINGS: Low lung volumes. Right lower lobe airspace opacity could be related to bronchoscopy or represent atelectasis. No pneumothorax. Previously seen mid right lung nodule not well visualized on today's study, likely due to the low volumes and airspace disease. Minimal left base atelectasis. Heart is borderline in size.  IMPRESSION: Low lung volumes with bibasilar opacities, likely atelectasis, right greater than left. No pneumothorax.   Electronically Signed   By: Rolm Baptise M.D.   On: 09/23/2014 13:10   Dg C-arm Bronchoscopy  09/23/2014   CLINICAL DATA: right middle lobe lung mass   C-ARM BRONCHOSCOPY  Fluoroscopy was utilized by the requesting physician.  No radiographic  interpretation.     PHYSICAL EXAM  Patient is oriented to person time and place. Her affect is normal although she seems somewhat depressed. Lungs reveal scattered rhonchi. Cardiac exam reveals S1 and S2. Abdomen is benign. She does have mild peripheral edema.   TELEMETRY: I have reviewed telemetry today October 16, 2014. There is sinus rhythm. She did have 4 beats of ventricular tachycardia.   ASSESSMENT AND PLAN:    Metastatic lung cancer (metastasis from lung to other site)     She is receiving chemotherapy. She did have some nausea last night.      Supraventricular tachycardia   PAF (paroxysmal atrial fibrillation)     The patient's rhythm has remained sinus since amiodarone was started. She is on IV amiodarone. Eventually it should be changed to oral. However with her current chemotherapy and other issues, it may be most prudent to continue IV amiodarone until we're sure she can successfully take oral amiodarone.    Ventricular tachycardia     Left ventricular function is normal. Continue to watch her electrolytes. No further workup for her ventricular ectopy.   Dola Argyle 10/16/2014 6:43 AM

## 2014-10-16 NOTE — Progress Notes (Addendum)
Physical Therapy Treatment Patient Details Name: Sabrina Adkins MRN: [id number] DOB: December 20, 1960 Today's Date: 10/16/2014    History of Present Illness 53 yo female admitted with metastatic lung cancer, new brain lesion, SVT, PAF. Hx of DM, anxiety, clotting d/o, HTN, bipolar d/o, DVT, recent diagnosis of lung cancer.     PT Comments    Progressing  Very slowly. Pt may need to have consult with SW to discuss possible need for ST rehab if functional status does not improve. Do not feel pt is safe to d/c home alone at this time.   Follow Up Recommendations  SNF vs Home health PT (progressing slowly-may need ST rehab at SNF 1st)     Equipment Recommendations  Rolling walker with 5" wheels (possibly)    Recommendations for Other Services OT consult     Precautions / Restrictions Precautions Precautions: Fall Precaution Comments: monitor HR Restrictions Weight Bearing Restrictions: No    Mobility  Bed Mobility Overal bed mobility: Needs Assistance Bed Mobility: Supine to Sit;Sit to Supine     Supine to sit: Min guard Sit to supine: Min guard   General bed mobility comments: increased time, guard for safety.  Transfers Overall transfer level: Needs assistance   Transfers: Sit to/from Stand Sit to Stand: Min guard Stand pivot transfers: Min guard       General transfer comment: min guard to stand with holding to grab bar in the bathroom.   Ambulation/Gait Ambulation/Gait assistance: Min assist Ambulation Distance (Feet): 15 Feet (x2) Assistive device:  (pt held onto IV pole for support) Gait Pattern/deviations: Trunk flexed;Decreased stride length     General Gait Details: very slow gait speed. short distance appeared effortful. seated rest break needed after 1st 15 feet   Stairs            Wheelchair Mobility    Modified Rankin (Stroke Patients Only)       Balance Overall balance assessment: Needs assistance           Standing balance-Leahy  Scale: Fair                      Cognition Arousal/Alertness: Awake/alert Behavior During Therapy: WFL for tasks assessed/performed Overall Cognitive Status: Within Functional Limits for tasks assessed                      Exercises      General Comments        Pertinent Vitals/Pain Pain Assessment: Faces Faces Pain Scale: Hurts even more Pain Location: all over Pain Intervention(s): Monitored during session    Home Living Family/patient expects to be discharged to:: Private residence Living Arrangements: Alone   Type of Home: Apartment Home Access: Stairs to enter   Home Layout: One level Home Equipment: None      Prior Function Level of Independence: Independent      Comments: pt states effort to complete ADL   PT Goals (current goals can now be found in the care plan section) Acute Rehab PT Goals Patient Stated Goal: none stated Progress towards PT goals: Progressing toward goals (very slowly)    Frequency  Min 3X/week    PT Plan Current plan remains appropriate    Co-evaluation             End of Session   Activity Tolerance: Patient limited by fatigue;Patient limited by pain Patient left: with call bell/phone within reach     Time: 7425-9563 PT Time Calculation (min): 20  min  Charges:  $Therapeutic Activity: 8-22 mins                    G Codes:      Weston Anna, MPT Pager: [phone]

## 2014-10-16 NOTE — Evaluation (Signed)
Occupational Therapy Evaluation Patient Details Name: Sabrina Adkins MRN: [id number] DOB: 1961-07-05 Today's Date: 10/16/2014    History of Present Illness 53 yo female admitted with metastatic lung cancer, new brain lesion, SVT, PAF. Hx of DM, anxiety, clotting d/o, HTN, bipolar d/o, DVT, recent diagnosis of lung cancer.    Clinical Impression   Pt requires max increased time and min assist to transfer into the bathroom for toileting task. Tasks are effortful due to pain. Began education on AE options for LB self care. Feel is is unsafe for pt to d/c alone at this time and recommend SNF at d/c currently. If she decides to d/c home would recommend [facility] and an aide.     Follow Up Recommendations  SNF;Other (comment) (could benefit from SNF but if pt decides d/c home recommend [facility] and aide)    Equipment Recommendations  3 in 1 bedside comode    Recommendations for Other Services       Precautions / Restrictions Precautions Precautions: Fall Precaution Comments: monitor HR      Mobility Bed Mobility Overal bed mobility: Needs Assistance Bed Mobility: Supine to Sit;Sit to Supine     Supine to sit: Min guard Sit to supine: Min guard   General bed mobility comments: increased time, guard for safety.  Transfers Overall transfer level: Needs assistance   Transfers: Sit to/from Stand Sit to Stand: Min guard         General transfer comment: min guard to stand with holding to grab bar in the bathroom.     Balance                                            ADL Overall ADL's : Needs assistance/impaired Eating/Feeding: Independent;Sitting   Grooming: Wash/dry hands;Standing;Minimal assistance Grooming Details (indicate cue type and reason): leaning onto sink Upper Body Bathing: Set up;Sitting   Lower Body Bathing: Moderate assistance;Sit to/from stand   Upper Body Dressing : Set up;Sitting   Lower Body Dressing: Moderate assistance;Sit to/from  stand   Toilet Transfer: Minimal assistance;Ambulation;Comfort height toilet;Grab bars   Toileting- Clothing Manipulation and Hygiene: Sit to/from stand;Min guard         General ADL Comments: Pt agreeable to OOB but increased time for all tasks. educated pt on AE options for LB self care as pt states it was difficult to don clothing PTA and now limited by edema and abdominal discomfort. Pt held to end of bed, IV pole and coutner in bathroom for support during transfer in and out of bathroom.      Vision                     Perception     Praxis      Pertinent Vitals/Pain       Hand Dominance     Extremity/Trunk Assessment Upper Extremity Assessment Upper Extremity Assessment: Generalized weakness           Communication Communication Communication: No difficulties   Cognition Arousal/Alertness: Awake/alert Behavior During Therapy: WFL for tasks assessed/performed Overall Cognitive Status: Within Functional Limits for tasks assessed                     General Comments       Exercises       Shoulder Instructions      Home Living Family/patient expects to be  discharged to:: Private residence Living Arrangements: Alone   Type of Home: Apartment Home Access: Stairs to enter [employer] of Steps: 2 flights   Home Layout: One level     Bathroom Shower/Tub: Teacher, early years/pre: Standard     Home Equipment: None          Prior Functioning/Environment Level of Independence: Independent        Comments: pt states effort to complete ADL    OT Diagnosis: Generalized weakness   OT Problem List: Decreased strength;Decreased knowledge of use of DME or AE;Decreased activity tolerance   OT Treatment/Interventions: Self-care/ADL training;Patient/family education;Therapeutic activities;DME and/or AE instruction;Energy conservation    OT Goals(Current goals can be found in the care plan section) Acute Rehab OT  Goals Patient Stated Goal: none stated OT Goal Formulation: With patient Time For Goal Achievement: 10/30/14 Potential to Achieve Goals: Good  OT Frequency: Min 2X/week   Barriers to D/C:            Co-evaluation              End of Session    Activity Tolerance: Patient limited by fatigue;Patient limited by pain Patient left: in bed;with call bell/phone within reach   Time: 6837-2902 OT Time Calculation (min): 21 min Charges:  OT General Charges $OT Visit: 1 Procedure OT Evaluation $Initial OT Evaluation Tier I: 1 Procedure G-Codes:    Jules Schick  [phone] 10/16/2014, 1:20 PM

## 2014-10-16 NOTE — Progress Notes (Addendum)
[facility] PROGRESS NOTE  Sabrina Adkins GDJ:[id number] DOB: 25-Nov-1961 DOA: 10/11/2014  PCP: Philis Fendt, MD  Brief HPI: Sabrina Adkins is a 53 y.o. 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. She presented with painful multiple subcutaneous nodules beneath her breasts, and the anterior and shoulder area and upper abdominal wall. CT chest angiogram revealed rapid progression of her lung cancer, withsubcutaneous nodules actually being metastatic disease, but having intra-abdominal metastasis, as well. She was admitted for chemotherapy. Patient then developed supraventricular tachycardia with heart rate being in the 170s. Seen by cardiology and started on Amiodarone. She was given chemotherapy with Gemzar and Carboplatin on 11/4.  Past medical history:  Past Medical History  Diagnosis Date  . Diabetes mellitus without complication   . Anxiety   . Clotting disorder     right leg DVT  . Coagulopathy 09/02/2014  . Hypertension   . Shortness of breath     "when I am getting ready to lay down"  . Pneumonia     2015  . Depression   . Bipolar disorder   . GERD (gastroesophageal reflux disease)   . Arthritis     RA  . DVT (deep venous thrombosis)     > 10 years ago, was on coumadin  . Cancer 10/12/2014    metastatic  lung    Consultants: Oncology, Cardiology  Procedures:  2 D ECHO Study Conclusions - Left ventricle: The cavity size was normal. Wall thickness wasnormal. Systolic function was normal. The estimated ejectionfraction was in the range of 55% to 60%. Wall motion was normal;there were no regional wall motion abnormalities. Leftventricular diastolic function parameters were normal. - Pericardium, extracardiac: A trivial pericardial effusion  wasidentified.  Antibiotics: None  Subjective: Patient complains of swelling all over. Pain all over. No dizziness. No headaches.   Objective: Vital Signs  Filed Vitals:   10/15/14 1356 10/15/14 1526 10/15/14 2130 10/16/14 0602  BP: 127/57 114/80 95/66 97/64   Pulse: 90 84 72 93  Temp: 97.4 F (36.3 C)  97.5 F (36.4 C) 97.9 F (36.6 C)  TempSrc: Oral  Oral Oral  Resp: 24  24 22   Height:      Weight:      SpO2: 96%  96% 96%    Intake/Output Summary (Last 24 hours) at 10/16/14 0852 Last data filed at 10/16/14 0334  Gross per 24 hour  Intake 2005.93 ml  Output   2600 ml  Net -594.07 ml   Filed Weights   10/11/14 2029 10/12/14 0440 10/12/14 1722  Weight: 113.717 kg (250 lb 11.2 oz) 113.7 kg (250 lb 10.6 oz) 113 kg (249 lb 1.9 oz)    General appearance: alert, cooperative, appears stated age and no distress. Mildly edematous. Resp: decreased air entry at bases. No definite crackles.  Cardio: regular rate and rhythm, S1, S2 normal, no murmur, click, rub or gallop GI: soft, non-tender; bowel sounds normal; no masses,  no organomegaly Mild edema bilateral LE with right appearing slightly larger than left. Neurologic: no focal deficits  Lab Results:  Basic Metabolic Panel:  Recent Labs Lab 10/11/14 1154 10/12/14 0321 10/14/14 0345 10/15/14 0514 10/16/14 0438  NA 132* 137 131* 134* 130*  K 3.9 4.0 4.1 4.8 4.1  CL 90* 94* 92* 96 91*  CO2 29 33* 28 27 29   GLUCOSE 349* 422* 278* 329* 236*  BUN 12 13 15 18 21   CREATININE 0.64 0.73 0.70 0.79 0.78  CALCIUM 10.2 10.0 9.7 9.8 9.6   Liver Function Tests:  Recent Labs Lab 10/11/14 1154  AST 17  ALT 17  ALKPHOS 67  BILITOT 0.6  PROT 7.2  ALBUMIN 3.6   CBC:  Recent Labs Lab 10/11/14 1154 10/12/14 0321 10/14/14 0345 10/15/14 0655 10/16/14 0438  WBC 19.5* 21.0* 22.6* 25.1* 21.4*  NEUTROABS 15.2*  --   --   --   --   HGB 14.4 13.6 12.7 13.3 12.5  HCT 42.8 41.1 39.1 39.8 37.3  MCV 71.1* 70.7* 71.5*  70.3* 69.9*  PLT 218 204 171 213 184   CBG:  Recent Labs Lab 10/15/14 0734 10/15/14 1219 10/15/14 1732 10/15/14 2129 10/16/14 0735  GLUCAP 312* 304* 374* 305* 246*    Recent Results (from the past 240 hour(s))  Urine culture     Status: None   Collection Time: 10/11/14 12:33 PM  Result Value Ref Range Status   Specimen Description URINE, CLEAN CATCH  Final   Special Requests NONE  Final   Culture  Setup Time   Final    10/12/2014 03:08 Performed at [facility]   Final    75,000 COLONIES/ML Performed at [facility]    Culture   Final    Multiple bacterial morphotypes present, none predominant. Suggest appropriate recollection if clinically indicated. Performed at [facility]    Report Status 10/13/2014 FINAL  Final  MRSA PCR Screening     Status: None   Collection Time: 10/12/14 10:29 AM  Result Value Ref Range Status   MRSA by PCR NEGATIVE NEGATIVE Final    Comment:        The GeneXpert MRSA Assay (FDA approved for NASAL specimens only), is one component of a comprehensive MRSA colonization surveillance program. It is not intended to diagnose MRSA infection nor to guide or monitor treatment for MRSA infections.       Studies/Results: Dg Chest Port 1 View  10/15/2014   CLINICAL DATA:  Dyspnea.  EXAM: PORTABLE CHEST - 1 VIEW  COMPARISON:  10/11/2014  FINDINGS: The cardiac silhouette remains enlarged. There is persistent elevation of right hemidiaphragm, not significantly changed. Right-sided lung nodules are again seen, measuring approximately 3.9 cm in the right base and 1.3 cm adjacent to the right hilum. Focal linear opacity in the lateral left mid lung is unchanged, likely platelike atelectasis. Right basilar atelectasis is again seen. No large pleural effusion is identified. No pneumothorax.  IMPRESSION: Stable appearance of the chest with right lung nodules/mass and bibasilar atelectasis.   Electronically Signed    By: Logan Bores   On: 10/15/2014 09:31    Medications:  Scheduled: . clopidogrel  75 mg Oral Daily  . docusate sodium  100 mg Oral BID  . gabapentin  100 mg Oral TID  . heparin  5,000 Units Subcutaneous 3 times per day  . insulin aspart  0-20 Units Subcutaneous TID WC  . insulin aspart  0-5 Units Subcutaneous QHS  . insulin aspart  3 Units Subcutaneous TID WC  . insulin glargine  15 Units Subcutaneous Daily  . insulin glargine  35 Units Subcutaneous QHS  . ipratropium-albuterol  3 mL Nebulization TID  . levofloxacin (LEVAQUIN) IV  500 mg Intravenous Q24H  . metoprolol tartrate  25 mg Oral TID  . multivitamin with minerals  1 tablet Oral Daily  . QUEtiapine  50 mg  Oral QHS  . senna  1 tablet Oral BID   Continuous: . sodium chloride 75 mL/hr at 10/15/14 0649  . amiodarone 60 mg/hr (10/16/14 0340)   YHO:[id number] **OR** acetaminophen, albuterol, ALPRAZolam, alum & mag hydroxide-simeth, bisacodyl, morphine injection, ondansetron **OR** ondansetron (ZOFRAN) IV, oxyCODONE, polyethylene glycol, promethazine, sodium chloride, sodium chloride, sodium chloride  Assessment/Plan:  Active Problems:   Metastatic lung cancer (metastasis from lung to other site)   DM (diabetes mellitus)   HTN (hypertension)   UTI (lower urinary tract infection)   Supraventricular tachycardia   PAF (paroxysmal atrial fibrillation)   Ventricular tachycardia   Metastatic disease   Paroxysmal atrial fibrillation   Type 2 diabetes mellitus with complication    Metastatic NSSLC New brain mets on brain MRI. CT abd/pelvis revealed intra-abdominal mets as well. Received Chemo with Gemzar and Carboplatin 11/4. Oncology following. Radiation to brain to be pursued at later date. Unclear what her overall prognosis is at this time. Will like Onc input in this matter. Swelling likely due to nutritional status. Get LE dopplers. Was given Lasix yesterday.  SVT/PAF Cardiology following and on Amiodarone. HR  continues to spike once in a while  UTI Cont on levaquin. 11/1 Urine cx with 75,000 multiple morphotypes present, none predominant.   Leukocytosis WBC better today. She remains afebrile. Will appreciate Onc input in this matter. Possibly due to to malignancy. No obvious infectious source apart from UTI. And UTI did not appear to be that significant. Unclear if she received any steroids or marrow stimulant recently.  Essential HTN Stable, controlled. Continue current regimen.  Diabetes mellitus Type 2 Poorly controlled. Will increase AM dose of lantus. Continue current PM dose. Continue SSI. A1c was 9.7 in September.  DVT Prophylaxis: Heparin SQ    Code Status: Full Code  Family Communication: Discussed with patient and her daughter today. Disposition Plan: Unclear for now. Patient would ultimately like to go to [facility] to be close to her daughter but not stable for discharge currently.    LOS: 5 days   [facility] Pager [phone] 10/16/2014, 8:52 AM  If 8PM-8AM, please contact night-coverage at [email or web address], password [facility]

## 2014-10-16 NOTE — Progress Notes (Signed)
Bilateral lower extremity venous duplex completed:  No evidence of DVT, superficial thrombosis, or Baker's cyst.   

## 2014-10-17 LAB — CBC
HCT: 35.3 % — ABNORMAL LOW (ref 36.0–46.0)
Hemoglobin: 11.8 g/dL — ABNORMAL LOW (ref 12.0–15.0)
MCH: 23.2 pg — ABNORMAL LOW (ref 26.0–34.0)
MCHC: 33.4 g/dL (ref 30.0–36.0)
MCV: 69.5 fL — ABNORMAL LOW (ref 78.0–100.0)
PLATELETS: 170 10*3/uL (ref 150–400)
RBC: 5.08 MIL/uL (ref 3.87–5.11)
RDW: 13.8 % (ref 11.5–15.5)
WBC: 18.8 10*3/uL — AB (ref 4.0–10.5)

## 2014-10-17 LAB — GLUCOSE, CAPILLARY
GLUCOSE-CAPILLARY: 263 mg/dL — AB (ref 70–99)
Glucose-Capillary: 185 mg/dL — ABNORMAL HIGH (ref 70–99)
Glucose-Capillary: 231 mg/dL — ABNORMAL HIGH (ref 70–99)
Glucose-Capillary: 222 mg/dL — ABNORMAL HIGH (ref 70–99)

## 2014-10-17 LAB — COMPREHENSIVE METABOLIC PANEL
ALT: 21 U/L (ref 0–35)
AST: 24 U/L (ref 0–37)
Albumin: 2.3 g/dL — ABNORMAL LOW (ref 3.5–5.2)
Alkaline Phosphatase: 66 U/L (ref 39–117)
Anion gap: 13 (ref 5–15)
BILIRUBIN TOTAL: 0.7 mg/dL (ref 0.3–1.2)
BUN: 21 mg/dL (ref 6–23)
CALCIUM: 9.5 mg/dL (ref 8.4–10.5)
CHLORIDE: 94 meq/L — AB (ref 96–112)
CO2: 28 meq/L (ref 19–32)
Creatinine, Ser: 0.76 mg/dL (ref 0.50–1.10)
GFR calc Af Amer: >90 mL/min (ref >90)
Glucose, Bld: 265 mg/dL — ABNORMAL HIGH (ref 70–99)
Potassium: 4.4 mEq/L (ref 3.7–5.3)
SODIUM: 135 meq/L — AB (ref 137–147)
Total Protein: 5.4 g/dL — ABNORMAL LOW (ref 6.0–8.3)

## 2014-10-17 MED ORDER — LEVOFLOXACIN 500 MG PO TABS
500.0000 mg | ORAL_TABLET | Freq: Every day | ORAL | Status: DC
  Filled 2014-10-17: qty 1

## 2014-10-17 MED ORDER — FUROSEMIDE 10 MG/ML IJ SOLN
40.0000 mg | Freq: Once | INTRAMUSCULAR | Status: AC
  Administered 2014-10-17: 40 mg via INTRAVENOUS
  Filled 2014-10-17: qty 4

## 2014-10-17 MED ORDER — METOPROLOL TARTRATE 25 MG PO TABS
25.0000 mg | ORAL_TABLET | Freq: Four times a day (QID) | ORAL | Status: DC
  Administered 2014-10-17 – 2014-10-22 (×16): 25 mg via ORAL
  Filled 2014-10-17 (×18): qty 1

## 2014-10-17 NOTE — Progress Notes (Signed)
Subjective:  No chest pain or SOB.  Minimal palpitations  Objective:  Vital Signs in the last 24 hours: BP 111/51 mmHg  Pulse 78  Temp(Src) 97.7 F (36.5 C) (Oral)  Resp 18  Ht 5\' 5"  (1.651 m)  Wt 113 kg (249 lb 1.9 oz)  BMI 41.46 kg/m2  SpO2 97%  Physical Exam: Obese BF in NAD Lungs:  Clear  Cardiac:  Regular rhythm, normal S1 and S2, no S3  Intake/Output from previous day: 11/06 0701 - 11/07 0700 In: 1153.3 [I.V.:1053.3; IV Piggyback:100] Out: 500 [Urine:500] Weight Filed Weights   10/11/14 2029 10/12/14 0440 10/12/14 1722  Weight: 113.717 kg (250 lb 11.2 oz) 113.7 kg (250 lb 10.6 oz) 113 kg (249 lb 1.9 oz)    Lab Results: Basic Metabolic Panel:  Recent Labs  10/16/14 0438 10/17/14 0459  NA 130* 135*  K 4.1 4.4  CL 91* 94*  CO2 29 28  GLUCOSE 236* 265*  BUN 21 21  CREATININE 0.78 0.76    CBC:  Recent Labs  10/16/14 0438 10/17/14 0459  WBC 21.4* PENDING  HGB 12.5 11.8*  HCT 37.3 35.3*  MCV 69.9* 69.5*  PLT 184 PENDING    BNP    Component Value Date/Time   PROBNP 561.6* 10/15/2014 0514    PROTIME: Lab Results  Component Value Date   INR 1.08 09/02/2014   INR 2.40* 08/25/2014   PROTIME * 09/02/2014    Telemetry:  Some continued PAF since yesterday.  Currently sinus  Assessment/Plan:  1. Paroxsymal atrial fibrillation and SVT continues 2. Metastatic lung cancer  Recommendations:  Continue amiodarone intravenously and may switch to oral once by mouth status is maintained. Increase metoprolol slightly.     Kerry Hough  MD [facility]  10/17/2014, 8:18 AM

## 2014-10-17 NOTE — Progress Notes (Signed)
CSW received referral for New SNF.  CSW reviewed chart and noted that PT evaluation recommends SNF vs Home health PT (progressing slowly-may need ST rehab at [facility]).  CSW attempted to meet with pt at bedside. Pt reports feeling very nauseous at this time. RN present at bedside administering medication. Pt asked for CSW to return at a later time as she felt very sick and did not feel that she could speak with CSW at this time. CSW expressed understanding.  CSW to follow up at a later time to discuss disposition needs and complete full psychosocial assessment.  Alison Murray, MSW, [place] [facility] [phone]

## 2014-10-17 NOTE — Progress Notes (Signed)
PHARMACIST - PHYSICIAN COMMUNICATION DR:   [facility] CONCERNING: Antibiotic IV to Oral Route Change Policy  RECOMMENDATION: This patient is receiving levofloxacin by the intravenous route.  Based on criteria approved by the Pharmacy and Therapeutics Committee, the antibiotic(s) is/are being converted to the equivalent oral dose form(s).   DESCRIPTION: These criteria include:  Patient being treated for a respiratory tract infection, urinary tract infection, cellulitis or clostridium difficile associated diarrhea if on metronidazole  The patient is not neutropenic and does not exhibit a GI malabsorption state  The patient is eating (either orally or via tube) and/or has been taking other orally administered medications for a least 24 hours  The patient is improving clinically and has a Tmax < 100.5  If you have questions about this conversion, please contact the Pharmacy Department  []   [phone] )  [facility] []   [phone] )  [facility]  []   [phone] )  [facility] [x]   [phone] )  [place], PharmD, BCPS.   Pager: [phone] 10/17/2014 12:36 PM

## 2014-10-17 NOTE — Progress Notes (Addendum)
[facility] PROGRESS NOTE  Sabrina Adkins IRS:[id number] DOB: [birth year] DOA: 10/11/2014  PCP: Philis Fendt, MD  Brief HPI: Sabrina Adkins is a 53 y.o. 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. She presented with painful multiple subcutaneous nodules beneath her breasts, and the anterior and shoulder area and upper abdominal wall. CT chest angiogram revealed rapid progression of her lung cancer, withsubcutaneous nodules actually being metastatic disease, but having intra-abdominal metastasis, as well. She was admitted for chemotherapy. Patient then developed supraventricular tachycardia with heart rate being in the 170s. Seen by cardiology and started on Amiodarone. She was given chemotherapy with Gemzar and Carboplatin on 11/4.  Past medical history:  Past Medical History  Diagnosis Date  . Diabetes mellitus without complication   . Anxiety   . Clotting disorder     right leg DVT  . Coagulopathy 09/02/2014  . Hypertension   . Shortness of breath     "when I am getting ready to lay down"  . Pneumonia     2015  . Depression   . Bipolar disorder   . GERD (gastroesophageal reflux disease)   . Arthritis     RA  . DVT (deep venous thrombosis)     > 10 years ago, was on coumadin  . Cancer 10/12/2014    metastatic  lung    Consultants: Oncology, Cardiology  Procedures:  2 D ECHO Study Conclusions - Left ventricle: The cavity size was normal. Wall thickness wasnormal. Systolic function was normal. The estimated ejectionfraction was in the range of 55% to 60%. Wall motion was normal;there were no regional wall motion abnormalities. Leftventricular diastolic function parameters were normal. - Pericardium, extracardiac: A trivial pericardial effusion wasidentified.  LE Venous Dopplers No  DVT  Antibiotics: None  Subjective: Patient continues to complain of swelling all over. Not much pain today.  Objective: Vital Signs  Filed Vitals:   10/16/14 1615 10/16/14 1951 10/16/14 2213 10/17/14 0520  BP: 120/53  99/61 111/51  Pulse: 81  77 78  Temp:   98.3 F (36.8 C) 97.7 F (36.5 C)  TempSrc:   Oral Oral  Resp:   20 18  Height:      Weight:      SpO2:  96% 97% 97%    Intake/Output Summary (Last 24 hours) at 10/17/14 1050 Last data filed at 10/17/14 0829  Gross per 24 hour  Intake 1273.32 ml  Output    500 ml  Net 773.32 ml   Filed Weights   10/11/14 2029 10/12/14 0440 10/12/14 1722  Weight: 113.717 kg (250 lb 11.2 oz) 113.7 kg (250 lb 10.6 oz) 113 kg (249 lb 1.9 oz)    General appearance: alert, cooperative, appears stated age and no distress. Mildly edematous. Resp: decreased air entry at bases. No definite crackles. No wheezing. Cardio: regular rate and rhythm, S1, S2 normal, no murmur, click, rub or gallop GI: soft, non-tender; bowel sounds normal; no masses,  no organomegaly Mild edema bilateral LE . Neurologic: no focal deficits  Lab Results:  Basic Metabolic Panel:  Recent Labs Lab 10/12/14 0321 10/14/14 0345 10/15/14 0514 10/16/14 0438 10/17/14 0459  NA 137 131* 134* 130* 135*  K 4.0 4.1 4.8 4.1 4.4  CL 94* 92* 96 91* 94*  CO2 33* 28 27 29 28   GLUCOSE 422* 278* 329* 236* 265*  BUN 13 15  18 21 21   CREATININE 0.73 0.70 0.79 0.78 0.76  CALCIUM 10.0 9.7 9.8 9.6 9.5   Liver Function Tests:  Recent Labs Lab 10/11/14 1154 10/17/14 0459  AST 17 24  ALT 17 21  ALKPHOS 67 66  BILITOT 0.6 0.7  PROT 7.2 5.4*  ALBUMIN 3.6 2.3*   CBC:  Recent Labs Lab 10/11/14 1154 10/12/14 0321 10/14/14 0345 10/15/14 0655 10/16/14 0438 10/17/14 0459  WBC 19.5* 21.0* 22.6* 25.1* 21.4* 18.8*  NEUTROABS 15.2*  --   --   --   --   --   HGB 14.4 13.6 12.7 13.3 12.5 11.8*  HCT 42.8 41.1 39.1 39.8 37.3 35.3*  MCV 71.1* 70.7* 71.5* 70.3* 69.9*  69.5*  PLT 218 204 171 213 184 170   CBG:  Recent Labs Lab 10/16/14 0735 10/16/14 1155 10/16/14 1751 10/16/14 2210 10/17/14 0729  GLUCAP 246* 327* 305* 259* 185*    Recent Results (from the past 240 hour(s))  Urine culture     Status: None   Collection Time: 10/11/14 12:33 PM  Result Value Ref Range Status   Specimen Description URINE, CLEAN CATCH  Final   Special Requests NONE  Final   Culture  Setup Time   Final    10/12/2014 03:08 Performed at [facility]   Final    75,000 COLONIES/ML Performed at [facility]    Culture   Final    Multiple bacterial morphotypes present, none predominant. Suggest appropriate recollection if clinically indicated. Performed at [facility]    Report Status 10/13/2014 FINAL  Final  MRSA PCR Screening     Status: None   Collection Time: 10/12/14 10:29 AM  Result Value Ref Range Status   MRSA by PCR NEGATIVE NEGATIVE Final    Comment:        The GeneXpert MRSA Assay (FDA approved for NASAL specimens only), is one component of a comprehensive MRSA colonization surveillance program. It is not intended to diagnose MRSA infection nor to guide or monitor treatment for MRSA infections.       Studies/Results: No results found.  Medications:  Scheduled: . clopidogrel  75 mg Oral Daily  . docusate sodium  100 mg Oral BID  . furosemide  40 mg Intravenous Once  . gabapentin  100 mg Oral TID  . heparin  5,000 Units Subcutaneous 3 times per day  . insulin aspart  0-20 Units Subcutaneous TID WC  . insulin aspart  0-5 Units Subcutaneous QHS  . insulin aspart  3 Units Subcutaneous TID WC  . insulin glargine  15 Units Subcutaneous Daily  . insulin glargine  35 Units Subcutaneous QHS  . levofloxacin (LEVAQUIN) IV  500 mg Intravenous Q24H  . metoprolol tartrate  25 mg Oral QID  . multivitamin with minerals  1 tablet Oral Daily  . QUEtiapine  50 mg Oral QHS  . senna  1 tablet Oral BID    Continuous: . sodium chloride 10 mL/hr at 10/16/14 2116  . amiodarone 60 mg/hr (10/17/14 0951)   XHB:[id number] **OR** acetaminophen, albuterol, ALPRAZolam, alum & mag hydroxide-simeth, bisacodyl, morphine injection, ondansetron **OR** ondansetron (ZOFRAN) IV, oxyCODONE, polyethylene glycol, promethazine, sodium chloride, sodium chloride, sodium chloride  Assessment/Plan:  Active Problems:   Metastatic lung cancer (metastasis from lung to other site)   DM (diabetes mellitus)   HTN (hypertension)   UTI (lower urinary tract infection)   Supraventricular tachycardia   PAF (paroxysmal atrial fibrillation)   Ventricular tachycardia  Metastatic disease   Paroxysmal atrial fibrillation   Type 2 diabetes mellitus with complication    Metastatic NSSLC New brain mets on brain MRI. CT abd/pelvis revealed intra-abdominal mets as well. Received Chemo with Gemzar and Carboplatin 11/4. Oncology following. Radiation to brain to be pursued at later date. Unclear what her overall prognosis is at this time. Will like Onc input in this matter. Swelling likely due to nutritional status. LE dopplers neg for DVT. Give another dose of lasix. She is in significant positive balance and less UO. Unclear if I/O being charted properly. Less nauseas today and tolerated her diet.   SVT/PAF Cardiology following and on Amiodarone infusion. HR continues to spike once in a while. Transition to oral once not vomiting. On BB and Plavix as well.  UTI Can stop levaquin. 11/1 Urine cx with 75,000 multiple morphotypes present, none predominant.   Leukocytosis WBC improving. She remains afebrile. Will appreciate Onc input in this matter. Possibly due to to malignancy. No obvious infectious source apart from UTI. And UTI did not appear to be that significant. Unclear if she received any steroids or marrow stimulant recently.  Essential HTN Stable, controlled. Continue current regimen.  Diabetes mellitus Type  2 Poorly controlled but improved after adjustments made to lantus dosing. Continue Lantus and SSI. A1c was 9.7 in September.  DVT Prophylaxis: Heparin SQ    Code Status: Full Code  Family Communication: Discussed with patient. Disposition Plan: Unclear for now. Patient would ultimately like to go to [place] to be close to her daughter.    LOS: 6 days   [facility] Pager [phone] 10/17/2014, 10:50 AM  If 8PM-8AM, please contact night-coverage at [email or web address], password [facility]

## 2014-10-17 NOTE — Plan of Care (Signed)
Problem: Phase II Progression Outcomes Goal: Progress activity as tolerated unless otherwise ordered Outcome: Completed/Met Date Met:  10/17/14     

## 2014-10-17 NOTE — Progress Notes (Signed)
PRN Albuterol 2.5mg  tx given per pt request. Pt in no obvious respiratory distress at time tx administered. RT will continue to monitor.

## 2014-10-18 DIAGNOSIS — R600 Localized edema: Secondary | ICD-10-CM | POA: Clinically undetermined

## 2014-10-18 DIAGNOSIS — R112 Nausea with vomiting, unspecified: Secondary | ICD-10-CM

## 2014-10-18 LAB — BASIC METABOLIC PANEL
Anion gap: 10 (ref 5–15)
BUN: 21 mg/dL (ref 6–23)
CO2: 29 mEq/L (ref 19–32)
Calcium: 9.5 mg/dL (ref 8.4–10.5)
Chloride: 92 mEq/L — ABNORMAL LOW (ref 96–112)
Creatinine, Ser: 0.84 mg/dL (ref 0.50–1.10)
GFR calc non Af Amer: 79 mL/min — ABNORMAL LOW (ref >90)
GLUCOSE: 198 mg/dL — AB (ref 70–99)
POTASSIUM: 4.5 meq/L (ref 3.7–5.3)
SODIUM: 131 meq/L — AB (ref 137–147)

## 2014-10-18 LAB — GLUCOSE, CAPILLARY
Glucose-Capillary: 193 mg/dL — ABNORMAL HIGH (ref 70–99)
Glucose-Capillary: 218 mg/dL — ABNORMAL HIGH (ref 70–99)
Glucose-Capillary: 240 mg/dL — ABNORMAL HIGH (ref 70–99)
Glucose-Capillary: 177 mg/dL — ABNORMAL HIGH (ref 70–99)

## 2014-10-18 LAB — CBC
HCT: 36.6 % (ref 36.0–46.0)
HEMOGLOBIN: 12.6 g/dL (ref 12.0–15.0)
MCH: 23.8 pg — ABNORMAL LOW (ref 26.0–34.0)
MCHC: 34.4 g/dL (ref 30.0–36.0)
MCV: 69.2 fL — ABNORMAL LOW (ref 78.0–100.0)
Platelets: 188 10*3/uL (ref 150–400)
RBC: 5.29 MIL/uL — ABNORMAL HIGH (ref 3.87–5.11)
RDW: 13.7 % (ref 11.5–15.5)
WBC: 17 10*3/uL — ABNORMAL HIGH (ref 4.0–10.5)

## 2014-10-18 MED ORDER — INSULIN GLARGINE 100 UNIT/ML ~~LOC~~ SOLN
18.0000 [IU] | Freq: Every day | SUBCUTANEOUS | Status: DC
  Administered 2014-10-19 – 2014-10-25 (×7): 18 [IU] via SUBCUTANEOUS
  Filled 2014-10-18 (×7): qty 0.18

## 2014-10-18 MED ORDER — FUROSEMIDE 10 MG/ML IJ SOLN
40.0000 mg | Freq: Once | INTRAMUSCULAR | Status: AC
  Administered 2014-10-18: 40 mg via INTRAVENOUS
  Filled 2014-10-18: qty 4

## 2014-10-18 NOTE — Clinical Social Work Psychosocial (Signed)
Clinical Social Work Department BRIEF PSYCHOSOCIAL ASSESSMENT 10/18/2014  Patient:  Sabrina Adkins, Sabrina Adkins     Account Number:  [id number]     Admit date:  10/11/2014  Clinical Social Worker:  Lovey Newcomer  Date/Time:  10/18/2014 11:48 AM  Referred by:  Physician  Date Referred:  10/18/2014 Referred for  SNF Placement   Other Referral:   NA   Interview type:  Patient Other interview type:   Patient alert and oriented at time of assessment.    PSYCHOSOCIAL DATA Living Status:  ALONE Admitted from facility:   Level of care:   Primary support name:  Jamelia Primary support relationship to patient:  CHILD, ADULT Degree of support available:   Support is good.    CURRENT CONCERNS Current Concerns  Post-Acute Placement   Other Concerns:   NA    SOCIAL WORK ASSESSMENT / PLAN CSW met with patient at bedside to complete assessment. Patient resting comfortably in bed. Patient states that she was admitted from home alone but does have good family support in [place]. CSW explained that PT has worked with patient and they have recommended SNF placement for patient at discharge. Patient states that she plans to return to [place], [place] to be with her family at discharge and her family is working to coordinate this. Patient plans to live with her daughter in [place]. Patient states that she is agreeable to placement if the family cannot manage to get her back to [place] in her current condition. CSW explained SNF search/placement process to patient and answered patient's questions. CSW will follow up with bed offers.   Assessment/plan status:  Psychosocial Support/Ongoing Assessment of Needs Other assessment/ plan:   Complete Fl2, Fax, PASRR   Information/referral to community resources:   CSW contact information given. SNF list will be given with bed offers once received.    PATIENT'S/FAMILY'S RESPONSE TO PLAN OF CARE: Patient would like to DC home with family in [place], which she  states that family is working on. Patient reports that her family is on their way from [place] now. Patient is agreeable to DC to SNF if it is not possible for patient to DC home with family in [place] at discharge.       Liz Beach MSW, [place], [place], [phone]

## 2014-10-18 NOTE — Clinical Social Work Placement (Signed)
Clinical Social Work Department CLINICAL SOCIAL WORK PLACEMENT NOTE 10/18/2014  Patient:  Sabrina Adkins, Sabrina Adkins  Account Number:  [id number] [place] date:  10/11/2014  Clinical Social Worker:  Kemper Durie, [place]  Date/time:  10/18/2014 03:01 PM  Clinical Social Work is seeking post-discharge placement for this patient at the following level of care:   SKILLED NURSING   (*CSW will update this form in Epic as items are completed)   10/18/2014  Patient/family provided with [facility] Department of Clinical Social Work's list of facilities offering this level of care within the geographic area requested by the patient (or if unable, by the patient's family).  10/18/2014  Patient/family informed of their freedom to choose among providers that offer the needed level of care, that participate in Medicare, Medicaid or managed care program needed by the patient, have an available bed and are willing to accept the patient.  10/18/2014  Patient/family informed of [facility]' ownership interest in [facility], as well as of the fact that they are under no obligation to receive care at this facility.  PASARR submitted to EDS on 10/18/2014 PASARR number received on 10/18/2014  FL2 transmitted to all facilities in geographic area requested by pt/family on  10/18/2014 FL2 transmitted to all facilities within larger geographic area on   Patient informed that his/her managed care company has contracts with or will negotiate with  certain facilities, including the following:     Patient/family informed of bed offers received:   Patient chooses bed at  Physician recommends and patient chooses bed at    Patient to be transferred to  on   Patient to be transferred to facility by  Patient and family notified of transfer on  Name of family member notified:    The following physician request were entered in Epic:   Additional Comments:    Liz Beach MSW, [place], [place],  [phone]

## 2014-10-18 NOTE — Progress Notes (Signed)
Subjective:  No chest pain or SOB.  Still having some palpitations. She vomited a couple of times yesterday and says that she is currently nauseated. Her food tray was not eaten this morning.  Objective:  Vital Signs in the last 24 hours: BP 104/68 mmHg  Pulse 83  Temp(Src) 97.7 F (36.5 C) (Oral)  Resp 18  Ht 5\' 5"  (1.651 m)  Wt 113 kg (249 lb 1.9 oz)  BMI 41.46 kg/m2  SpO2 99%  Physical Exam: Obese BF in NAD Lungs:  Clear  Cardiac:  Regular rhythm, normal S1 and S2, no S3  Intake/Output from previous day: 11/07 0701 - 11/08 0700 In: 699.6 [P.O.:180; I.V.:519.6] Out: 1200 [Urine:1200] Weight Filed Weights   10/11/14 2029 10/12/14 0440 10/12/14 1722  Weight: 113.717 kg (250 lb 11.2 oz) 113.7 kg (250 lb 10.6 oz) 113 kg (249 lb 1.9 oz)    Lab Results: Basic Metabolic Panel:  Recent Labs  10/17/14 0459 10/18/14 0520  NA 135* 131*  K 4.4 4.5  CL 94* 92*  CO2 28 29  GLUCOSE 265* 198*  BUN 21 21  CREATININE 0.76 0.84    CBC:  Recent Labs  10/17/14 0459 10/18/14 0520  WBC 18.8* 17.0*  HGB 11.8* 12.6  HCT 35.3* 36.6  MCV 69.5* 69.2*  PLT 170 188    BNP    Component Value Date/Time   PROBNP 561.6* 10/15/2014 0514    PROTIME: Lab Results  Component Value Date   INR 1.08 09/02/2014   INR 2.40* 08/25/2014   PROTIME * 09/02/2014    Telemetry:  Some continued PAF since yesterday.  Currently sinusOn telemetry Assessment/Plan:  1. Paroxsymal atrial fibrillation and SVT -currently being loaded on amiodarone 2. Metastatic lung cancer  Recommendations:  Continue amiodarone intravenously today. She did vomit a couple of times and would like to see a higher oral intake and no vomiting prior to switching to oral since she is continuing to have atrial fibrillation.     Kerry Hough  MD [facility]  10/18/2014, 9:11 AM

## 2014-10-18 NOTE — Progress Notes (Signed)
[facility] PROGRESS NOTE  Sabrina Adkins QTM:[id number] DOB: 1960-12-13 DOA: 10/11/2014  PCP: Philis Fendt, MD  Brief HPI: Sabrina Adkins is a 53 y.o. 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. She presented with painful multiple subcutaneous nodules beneath her breasts, and the anterior and shoulder area and upper abdominal wall. CT chest angiogram revealed rapid progression of her lung cancer, withsubcutaneous nodules actually being metastatic disease, but having intra-abdominal metastasis, as well. She was admitted for chemotherapy. Patient then developed supraventricular tachycardia with heart rate being in the 170s. Seen by cardiology and started on Amiodarone. She was given chemotherapy with Gemzar and Carboplatin on 11/4.  Past medical history:  Past Medical History  Diagnosis Date  . Diabetes mellitus without complication   . Anxiety   . Clotting disorder     right leg DVT  . Coagulopathy 09/02/2014  . Hypertension   . Shortness of breath     "when I am getting ready to lay down"  . Pneumonia     2015  . Depression   . Bipolar disorder   . GERD (gastroesophageal reflux disease)   . Arthritis     RA  . DVT (deep venous thrombosis)     > 10 years ago, was on coumadin  . Cancer 10/12/2014    metastatic  lung    Consultants: Oncology, Cardiology  Procedures:  2 D ECHO Study Conclusions - Left ventricle: The cavity size was normal. Wall thickness wasnormal. Systolic function was normal. The estimated ejectionfraction was in the range of 55% to 60%. Wall motion was normal;there were no regional wall motion abnormalities. Leftventricular diastolic function parameters were normal. - Pericardium, extracardiac: A trivial pericardial effusion wasidentified.  LE Venous Dopplers No  DVT  Antibiotics: None  Subjective: Patient continues to complain of swelling all over. Not much pain today.  Objective: Vital Signs  Filed Vitals:   10/17/14 1203 10/17/14 1328 10/17/14 2138 10/18/14 0643  BP:  114/66 95/68 104/68  Pulse:  68 90 83  Temp:  98.1 F (36.7 C) 99.3 F (37.4 C) 97.7 F (36.5 C)  TempSrc:  Oral Oral Oral  Resp:  16 18 18   Height:      Weight:      SpO2: 97% 96% 98% 99%    Intake/Output Summary (Last 24 hours) at 10/18/14 0951 Last data filed at 10/17/14 1800  Gross per 24 hour  Intake  579.6 ml  Output   1200 ml  Net -620.4 ml   Filed Weights   10/11/14 2029 10/12/14 0440 10/12/14 1722  Weight: 113.717 kg (250 lb 11.2 oz) 113.7 kg (250 lb 10.6 oz) 113 kg (249 lb 1.9 oz)    General appearance: alert, cooperative, appears stated age and no distress. Mildly edematous. Resp: Improved air entry. No definite crackles. No wheezing. Cardio: regular rate and rhythm, S1, S2 normal, no murmur, click, rub or gallop GI: soft, non-tender; bowel sounds normal; no masses,  no organomegaly Mild edema bilateral LE . Neurologic: no focal deficits  Lab Results:  Basic Metabolic Panel:  Recent Labs Lab 10/14/14 0345 10/15/14 0514 10/16/14 0438 10/17/14 0459 10/18/14 0520  NA 131* 134* 130* 135* 131*  K 4.1 4.8 4.1 4.4 4.5  CL 92* 96 91* 94* 92*  CO2 28 27 29 28 29   GLUCOSE 278* 329* 236* 265* 198*  BUN 15  18 21 21 21   CREATININE 0.70 0.79 0.78 0.76 0.84  CALCIUM 9.7 9.8 9.6 9.5 9.5   Liver Function Tests:  Recent Labs Lab 10/11/14 1154 10/17/14 0459  AST 17 24  ALT 17 21  ALKPHOS 67 66  BILITOT 0.6 0.7  PROT 7.2 5.4*  ALBUMIN 3.6 2.3*   CBC:  Recent Labs Lab 10/11/14 1154  10/14/14 0345 10/15/14 0655 10/16/14 0438 10/17/14 0459 10/18/14 0520  WBC 19.5*  < > 22.6* 25.1* 21.4* 18.8* 17.0*  NEUTROABS 15.2*  --   --   --   --   --   --   HGB 14.4  < > 12.7 13.3 12.5 11.8* 12.6  HCT 42.8  < > 39.1 39.8 37.3 35.3* 36.6  MCV  71.1*  < > 71.5* 70.3* 69.9* 69.5* 69.2*  PLT 218  < > 171 213 184 170 188  < > = values in this interval not displayed. CBG:  Recent Labs Lab 10/17/14 0729 10/17/14 1128 10/17/14 1653 10/17/14 2141 10/18/14 0740  GLUCAP 185* 231* 263* 222* 193*    Recent Results (from the past 240 hour(s))  Urine culture     Status: None   Collection Time: 10/11/14 12:33 PM  Result Value Ref Range Status   Specimen Description URINE, CLEAN CATCH  Final   Special Requests NONE  Final   Culture  Setup Time   Final    10/12/2014 03:08 Performed at [place] [facility]   Final    75,000 COLONIES/ML Performed at [facility]    Culture   Final    Multiple bacterial morphotypes present, none predominant. Suggest appropriate recollection if clinically indicated. Performed at [facility]    Report Status 10/13/2014 FINAL  Final  MRSA PCR Screening     Status: None   Collection Time: 10/12/14 10:29 AM  Result Value Ref Range Status   MRSA by PCR NEGATIVE NEGATIVE Final    Comment:        The GeneXpert MRSA Assay (FDA approved for NASAL specimens only), is one component of a comprehensive MRSA colonization surveillance program. It is not intended to diagnose MRSA infection nor to guide or monitor treatment for MRSA infections.       Studies/Results: No results found.  Medications:  Scheduled: . clopidogrel  75 mg Oral Daily  . docusate sodium  100 mg Oral BID  . furosemide  40 mg Intravenous Once  . gabapentin  100 mg Oral TID  . heparin  5,000 Units Subcutaneous 3 times per day  . insulin aspart  0-20 Units Subcutaneous TID WC  . insulin aspart  0-5 Units Subcutaneous QHS  . insulin aspart  3 Units Subcutaneous TID WC  . [START ON 10/19/2014] insulin glargine  18 Units Subcutaneous Daily  . insulin glargine  35 Units Subcutaneous QHS  . metoprolol tartrate  25 mg Oral QID  . multivitamin with minerals  1 tablet Oral Daily  . QUEtiapine  50  mg Oral QHS  . senna  1 tablet Oral BID   Continuous: . sodium chloride 10 mL/hr at 10/16/14 2116  . amiodarone 60 mg/hr (10/18/14 0543)   OIN:[id number] **OR** acetaminophen, albuterol, ALPRAZolam, alum & mag hydroxide-simeth, bisacodyl, morphine injection, ondansetron **OR** ondansetron (ZOFRAN) IV, oxyCODONE, polyethylene glycol, promethazine, sodium chloride, sodium chloride, sodium chloride  Assessment/Plan:  Active Problems:   Metastatic lung cancer (metastasis from lung to other site)   DM (diabetes mellitus)   HTN (hypertension)  UTI (lower urinary tract infection)   Supraventricular tachycardia   PAF (paroxysmal atrial fibrillation)   Ventricular tachycardia   Metastatic disease   Paroxysmal atrial fibrillation   Type 2 diabetes mellitus with complication    Metastatic NSSLC New brain mets on brain MRI. CT abd/pelvis revealed intra-abdominal mets as well. Received Chemo with Gemzar and Carboplatin 11/4. Oncology following. Radiation to brain to be pursued at later date. Unclear what her overall prognosis is at this time. Will like Onc input in this matter. Will discuss with Dr. Julien Nordmann in AM. Swelling likely due to nutritional status. LE dopplers neg for DVT. Give another dose of lasix. She is in significant positive balance. Better UO yesterday. Had an episode of vomiting yesterday.   Nausea and Vomiting Likely due to chemotherapy. Abdomen is benign.   Pedal Edema Lasix as discussed above. Watch sodium levels closely.  SVT/PAF Cardiology following and on Amiodarone infusion. HR continues to spike once in a while. Transition to oral once not vomiting. On BB and Plavix as well.  UTI Completed course of levaquin. 11/1 Urine cx with 75,000 multiple morphotypes present, none predominant.   Leukocytosis WBC improving. She remains afebrile. Possibly due to to malignancy. No obvious infectious source apart from UTI. And UTI did not appear to be that significant. Unclear  if she received any steroids or marrow stimulant recently.  Essential HTN Stable, controlled. Continue current regimen.  Diabetes mellitus Type 2 Poorly controlled but improved after adjustments made to lantus dosing. Continue Lantus and SSI. A1c was 9.7 in September.  DVT Prophylaxis: Heparin SQ    Code Status: Full Code  Family Communication: Discussed with patient. Disposition Plan: Unclear for now. Patient would ultimately like to go to [place] to be close to her daughter. Daughter is coming in tomorrow.    LOS: 7 days   [facility] Pager [phone] 10/18/2014, 9:51 AM  If 8PM-8AM, please contact night-coverage at [email or web address], password [facility]

## 2014-10-19 LAB — CBC
HEMATOCRIT: 33 % — AB (ref 36.0–46.0)
Hemoglobin: 11.2 g/dL — ABNORMAL LOW (ref 12.0–15.0)
MCH: 23.4 pg — ABNORMAL LOW (ref 26.0–34.0)
MCHC: 33.9 g/dL (ref 30.0–36.0)
MCV: 69 fL — ABNORMAL LOW (ref 78.0–100.0)
Platelets: 157 10*3/uL (ref 150–400)
RBC: 4.78 MIL/uL (ref 3.87–5.11)
RDW: 13.7 % (ref 11.5–15.5)
WBC: 10.6 10*3/uL — ABNORMAL HIGH (ref 4.0–10.5)

## 2014-10-19 LAB — BASIC METABOLIC PANEL
Anion gap: 11 (ref 5–15)
BUN: 16 mg/dL (ref 6–23)
CHLORIDE: 93 meq/L — AB (ref 96–112)
CO2: 26 mEq/L (ref 19–32)
Calcium: 8.7 mg/dL (ref 8.4–10.5)
Creatinine, Ser: 0.77 mg/dL (ref 0.50–1.10)
Glucose, Bld: 225 mg/dL — ABNORMAL HIGH (ref 70–99)
Potassium: 4.2 mEq/L (ref 3.7–5.3)
Sodium: 130 mEq/L — ABNORMAL LOW (ref 137–147)

## 2014-10-19 LAB — GLUCOSE, CAPILLARY
GLUCOSE-CAPILLARY: 192 mg/dL — AB (ref 70–99)
GLUCOSE-CAPILLARY: 253 mg/dL — AB (ref 70–99)
GLUCOSE-CAPILLARY: 132 mg/dL — AB (ref 70–99)
Glucose-Capillary: 237 mg/dL — ABNORMAL HIGH (ref 70–99)

## 2014-10-19 MED ORDER — AMIODARONE HCL 200 MG PO TABS
400.0000 mg | ORAL_TABLET | Freq: Every day | ORAL | Status: DC
  Administered 2014-10-19 – 2014-10-24 (×6): 400 mg via ORAL
  Filled 2014-10-19 (×7): qty 2

## 2014-10-19 NOTE — Progress Notes (Signed)
OT Cancellation Note  Patient Details Name: Sabrina Adkins MRN: [id number] DOB: 03-09-1961   Cancelled Treatment:    Reason Eval/Treat Not Completed: Fatigue/lethargy limiting ability to participate Pt agreed for OT to recheck on pt later in day or next day Kari Baars, [place]  Payton Mccallum D 10/19/2014, 1:01 PM

## 2014-10-19 NOTE — Progress Notes (Signed)
Physical Therapy Treatment Patient Details Name: Sabrina Adkins MRN: [id number] DOB: [birth year] Today's Date: 10/19/2014    History of Present Illness 53 yo female admitted with metastatic lung cancer, new brain lesion, SVT, PAF. Hx of DM, anxiety, clotting d/o, HTN, bipolar d/o, DVT, recent diagnosis of lung cancer.     PT Comments    Patient appreciative of  Care given/bath. Pt is limited  In ambulation due to fatigue and pain.  Patient reports daughter is coming in  Today to assist with Dc plans.  Follow Up Recommendations  Home health PT;SNF (depends on plns for   going  to daughter's home.)     Equipment Recommendations  Rolling walker with 5" wheels    Recommendations for Other Services       Precautions / Restrictions Precautions Precautions: Fall Precaution Comments: monitor HR    Mobility  Bed Mobility   Bed Mobility: Supine to Sit     Supine to sit: Supervision     General bed mobility comments: increased time,   Transfers Overall transfer level: Needs assistance   Transfers: Sit to/from Stand Sit to Stand: Min guard Stand pivot transfers: Min guard       General transfer comment: uses IV pole and rail to pull up into standing from bed and toilet and grab bar in  bathroom  Ambulation/Gait Ambulation/Gait assistance: Min assist Ambulation Distance (Feet): 15 Feet (then 20',)         General Gait Details: very slow gait speed. short distance appeared  guarde, holds on to IV pole   Stairs            Wheelchair Mobility    Modified Rankin (Stroke Patients Only)       Balance           Standing balance support: Single extremity supported Standing balance-Leahy Scale: Fair                      Cognition Arousal/Alertness: Awake/alert Behavior During Therapy: WFL for tasks assessed/performed (reports that she is  agravated about not getting a bath. CNA  aware and uin to assist with bath.)                         Exercises      General Comments        Pertinent Vitals/Pain Faces Pain Scale: Hurts little more Pain Location: all over Pain Descriptors / Indicators: Discomfort Pain Intervention(s): Monitored during session    Home Living                      Prior Function            PT Goals (current goals can now be found in the care plan section) Progress towards PT goals: Progressing toward goals    Frequency  Min 3X/week    PT Plan Current plan remains appropriate    Co-evaluation             End of Session   Activity Tolerance: Patient limited by fatigue;Patient limited by pain Patient left: in chair;with call bell/phone within reach;with nursing/sitter in room     Time: 1110-1133 PT Time Calculation (min): 23 min  Charges:  $Gait Training: 8-22 mins $Self Care/Home Management: 8-22                    G Codes:      Claretha Cooper 10/19/2014, 11:44  AM Tresa Endo PT [phone]

## 2014-10-19 NOTE — Progress Notes (Addendum)
[facility] PROGRESS NOTE  Sabrina Adkins YQI:[id number] DOB: 04/20/1961 DOA: 10/11/2014  PCP: Philis Fendt, MD  Brief HPI: Sabrina Adkins is a 53 y.o. 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. She presented with painful multiple subcutaneous nodules beneath her breasts, and the anterior and shoulder area and upper abdominal wall. CT chest angiogram revealed rapid progression of her lung cancer, withsubcutaneous nodules actually being metastatic disease, but having intra-abdominal metastasis, as well. She was admitted for chemotherapy. Patient then developed supraventricular tachycardia with heart rate being in the 170s. Seen by cardiology and started on Amiodarone. She was given chemotherapy with Gemzar and Carboplatin on 11/4.  Past medical history:  Past Medical History  Diagnosis Date  . Diabetes mellitus without complication   . Anxiety   . Clotting disorder     right leg DVT  . Coagulopathy 09/02/2014  . Hypertension   . Shortness of breath     "when I am getting ready to lay down"  . Pneumonia     2015  . Depression   . Bipolar disorder   . GERD (gastroesophageal reflux disease)   . Arthritis     RA  . DVT (deep venous thrombosis)     > 10 years ago, was on coumadin  . Cancer 10/12/2014    metastatic  lung    Consultants: Oncology, Cardiology  Procedures:  2 D ECHO Study Conclusions - Left ventricle: The cavity size was normal. Wall thickness wasnormal. Systolic function was normal. The estimated ejectionfraction was in the range of 55% to 60%. Wall motion was normal;there were no regional wall motion abnormalities. Leftventricular diastolic function parameters were normal. - Pericardium, extracardiac: A trivial pericardial effusion wasidentified.  LE Venous Dopplers No  DVT  Antibiotics: None  Subjective: Patient states she is finally hungry. Denies nausea and vomiting for 24 hrs.   Objective: Vital Signs  Filed Vitals:   10/18/14 1500 10/18/14 2207 10/18/14 2236 10/19/14 0545  BP: 109/53 100/54  125/50  Pulse: 74 64 84 72  Temp: 98.2 F (36.8 C) 98.1 F (36.7 C)  97.5 F (36.4 C)  TempSrc: Oral Oral  Oral  Resp:  16  20  Height:      Weight:      SpO2: 99% 98%  98%    Intake/Output Summary (Last 24 hours) at 10/19/14 0847 Last data filed at 10/19/14 0653  Gross per 24 hour  Intake 1124.55 ml  Output      0 ml  Net 1124.55 ml   Filed Weights   10/11/14 2029 10/12/14 0440 10/12/14 1722  Weight: 113.717 kg (250 lb 11.2 oz) 113.7 kg (250 lb 10.6 oz) 113 kg (249 lb 1.9 oz)    General appearance: alert, cooperative, appears stated age and no distress. Mildly edematous. Resp: No definite crackles. No wheezing. Cardio: regular rate and rhythm, S1, S2 normal, no murmur, click, rub or gallop GI: soft, non-tender; bowel sounds normal; no masses,  no organomegaly Improved edema bilateral LE . Neurologic: no focal deficits  Lab Results:  Basic Metabolic Panel:  Recent Labs Lab 10/15/14 0514 10/16/14 0438 10/17/14 0459 10/18/14 0520 10/19/14 0540  NA 134* 130* 135* 131* 130*  K 4.8 4.1 4.4 4.5 4.2  CL 96 91* 94* 92* 93*  CO2 27 29 28 29 26   GLUCOSE 329* 236* 265* 198* 225*  BUN  18 21 21 21 16   CREATININE 0.79 0.78 0.76 0.84 0.77  CALCIUM 9.8 9.6 9.5 9.5 8.7   Liver Function Tests:  Recent Labs Lab 10/17/14 0459  AST 24  ALT 21  ALKPHOS 66  BILITOT 0.7  PROT 5.4*  ALBUMIN 2.3*   CBC:  Recent Labs Lab 10/15/14 0655 10/16/14 0438 10/17/14 0459 10/18/14 0520 10/19/14 0540  WBC 25.1* 21.4* 18.8* 17.0* 10.6*  HGB 13.3 12.5 11.8* 12.6 11.2*  HCT 39.8 37.3 35.3* 36.6 33.0*  MCV 70.3* 69.9* 69.5* 69.2* 69.0*  PLT 213 184 170 188 157   CBG:  Recent Labs Lab 10/18/14 0740 10/18/14 1143 10/18/14 1736  10/18/14 2212 10/19/14 0802  GLUCAP 193* 218* 240* 177* 192*    Recent Results (from the past 240 hour(s))  Urine culture     Status: None   Collection Time: 10/11/14 12:33 PM  Result Value Ref Range Status   Specimen Description URINE, CLEAN CATCH  Final   Special Requests NONE  Final   Culture  Setup Time   Final    10/12/2014 03:08 Performed at [facility]   Final    75,000 COLONIES/ML Performed at [facility]    Culture   Final    Multiple bacterial morphotypes present, none predominant. Suggest appropriate recollection if clinically indicated. Performed at [facility]    Report Status 10/13/2014 FINAL  Final  MRSA PCR Screening     Status: None   Collection Time: 10/12/14 10:29 AM  Result Value Ref Range Status   MRSA by PCR NEGATIVE NEGATIVE Final    Comment:        The GeneXpert MRSA Assay (FDA approved for NASAL specimens only), is one component of a comprehensive MRSA colonization surveillance program. It is not intended to diagnose MRSA infection nor to guide or monitor treatment for MRSA infections.       Studies/Results: No results found.  Medications:  Scheduled: . clopidogrel  75 mg Oral Daily  . docusate sodium  100 mg Oral BID  . gabapentin  100 mg Oral TID  . heparin  5,000 Units Subcutaneous 3 times per day  . insulin aspart  0-20 Units Subcutaneous TID WC  . insulin aspart  0-5 Units Subcutaneous QHS  . insulin aspart  3 Units Subcutaneous TID WC  . insulin glargine  18 Units Subcutaneous Daily  . insulin glargine  35 Units Subcutaneous QHS  . metoprolol tartrate  25 mg Oral QID  . multivitamin with minerals  1 tablet Oral Daily  . QUEtiapine  50 mg Oral QHS  . senna  1 tablet Oral BID   Continuous: . sodium chloride 10 mL/hr at 10/16/14 2116  . amiodarone 60 mg/hr (10/18/14 2242)   NKN:[id number] **OR** acetaminophen, albuterol, ALPRAZolam, alum & mag hydroxide-simeth, bisacodyl,  morphine injection, ondansetron **OR** ondansetron (ZOFRAN) IV, oxyCODONE, polyethylene glycol, promethazine, sodium chloride, sodium chloride, sodium chloride  Assessment/Plan:  Active Problems:   Metastatic lung cancer (metastasis from lung to other site)   DM (diabetes mellitus)   HTN (hypertension)   UTI (lower urinary tract infection)   Supraventricular tachycardia   PAF (paroxysmal atrial fibrillation)   Ventricular tachycardia   Metastatic disease   Paroxysmal atrial fibrillation   Type 2 diabetes mellitus with complication   Nausea and vomiting   Pedal edema    Metastatic NSSLC She has new brain mets on brain MRI. CT abd/pelvis revealed intra-abdominal mets as well. Received Chemo with Gemzar  and Carboplatin 11/4. Oncology following. Radiation to brain to be pursued at later date. Unclear what her overall prognosis is at this time. Will like Onc input in this matter. Will discuss with Dr. Julien Nordmann in AM. Swelling likely due to nutritional status. LE dopplers neg for DVT. Edema improved with Lasix. No UO charted.   Nausea and Vomiting Seems to be improving. This was likely due to chemotherapy. Abdomen is benign.   Pedal Edema Improved. No more lasix today.   SVT/PAF Cardiology following and on Amiodarone infusion. HR continues to spike once in a while. Transition to oral once not vomiting. On BB and Plavix as well. I am anticipating transition to oral Amiodarone soon.   UTI Completed course of levaquin. 11/1 Urine cx with 75,000 multiple morphotypes present, none predominant.   Leukocytosis WBC almost normal. She remains afebrile. This was possibly due to to malignancy. No obvious infectious source apart from UTI. And UTI did not appear to be that significant. Unclear if she received any steroids or marrow stimulant recently.  Essential HTN Stable, controlled. Continue current regimen.  Diabetes mellitus Type 2 Poorly controlled but improved after adjustments made to  lantus dosing. Continue Lantus and SSI. A1c was 9.7 in September. May need to make further adjustments as her oral intake improves.   DVT Prophylaxis: Heparin SQ    Code Status: Full Code  Family Communication: Discussed with patient. Disposition Plan: Unclear for now. Patient would ultimately like to go to [place] to be close to her daughter. Daughter is in town. Will discuss with her today.    LOS: 8 days   [facility] Pager [phone] 10/19/2014, 8:47 AM  If 8PM-8AM, please contact night-coverage at [email or web address], password [email or web address]  Discussed with Dr. Irish Lack as patient has not vomited in 36 hrs. Will start Amiodarone 400mg  daily and stop the infusion.  Shritha Bresee 2:23 PM

## 2014-10-19 NOTE — Progress Notes (Signed)
SUBJECTIVE:  Palpitations are less frequent.  Appetite is better.  No vomiting since last night.  OBJECTIVE:   Vitals:   Filed Vitals:   10/18/14 1500 10/18/14 2207 10/18/14 2236 10/19/14 0545  BP: 109/53 100/54  125/50  Pulse: 74 64 84 72  Temp: 98.2 F (36.8 C) 98.1 F (36.7 C)  97.5 F (36.4 C)  TempSrc: Oral Oral  Oral  Resp:  16  20  Height:      Weight:      SpO2: 99% 98%  98%   I&O's:   Intake/Output Summary (Last 24 hours) at 10/19/14 0841 Last data filed at 10/19/14 2355  Gross per 24 hour  Intake 1124.55 ml  Output      0 ml  Net 1124.55 ml   TELEMETRY: Reviewed telemetry pt in NSR, PAF:     PHYSICAL EXAM General: Well developed, well nourished, in no acute distress Head:   Normal cephalic and atramatic  Lungs:  No wheeze Heart:   HRRR    Abdomen: obese  Neuro: Alert and oriented. Psych:  Normal affect, responds appropriately Skin: No rash   LABS: Basic Metabolic Panel:  Recent Labs  10/18/14 0520 10/19/14 0540  NA 131* 130*  K 4.5 4.2  CL 92* 93*  CO2 29 26  GLUCOSE 198* 225*  BUN 21 16  CREATININE 0.84 0.77  CALCIUM 9.5 8.7   Liver Function Tests:  Recent Labs  10/17/14 0459  AST 24  ALT 21  ALKPHOS 66  BILITOT 0.7  PROT 5.4*  ALBUMIN 2.3*   No results for input(s): LIPASE, AMYLASE in the last 72 hours. CBC:  Recent Labs  10/18/14 0520 10/19/14 0540  WBC 17.0* 10.6*  HGB 12.6 11.2*  HCT 36.6 33.0*  MCV 69.2* 69.0*  PLT 188 157   Cardiac Enzymes: No results for input(s): CKTOTAL, CKMB, CKMBINDEX, TROPONINI in the last 72 hours. BNP: Invalid input(s): POCBNP D-Dimer: No results for input(s): DDIMER in the last 72 hours. Hemoglobin A1C: No results for input(s): HGBA1C in the last 72 hours. Fasting Lipid Panel: No results for input(s): CHOL, HDL, LDLCALC, TRIG, CHOLHDL, LDLDIRECT in the last 72 hours. Thyroid Function Tests: No results for input(s): TSH, T4TOTAL, T3FREE, THYROIDAB in the last 72  hours.  Invalid input(s): FREET3 Anemia Panel: No results for input(s): VITAMINB12, FOLATE, FERRITIN, TIBC, IRON, RETICCTPCT in the last 72 hours. Coag Panel:   Lab Results  Component Value Date   INR 1.08 09/02/2014   INR 2.40* 08/25/2014   PROTIME * 09/02/2014    RADIOLOGY: Ct Abdomen Pelvis Wo Contrast  10/13/2014   CLINICAL DATA:  Newly diagnosed metastatic lung cancer. No abdominal plate.  EXAM: CT ABDOMEN AND PELVIS WITHOUT CONTRAST  TECHNIQUE: Multidetector CT imaging of the abdomen and pelvis was performed following the standard protocol without IV contrast.  COMPARISON:  Chest CT 10/11/2014  FINDINGS: Right middle lobe mass at the right lung base again noted, measuring up to 3.8 cm compared with 3.6 cm. The slight change likely related to differences in scan planes. Other smaller bilateral lower lobe nodules are noted as measured on recent chest CT. Small right pleural effusion. Atelectasis in the right lower lobe and right middle lobe at the right lung base. Heart is normal size.  Numerous subcutaneous and chest wall nodules noted, the largest in the left lower chest wall measuring up to 2.4 cm, stable.  3.2 cm right adrenal mass. Left adrenal mass measures up to 4.1 cm. These slight differences  likely reflect slight differences in scan planes. No definite focal hepatic abnormality on this unenhanced study. Spleen, pancreas have an unremarkable unenhanced appearance.  Innumerable retroperitoneal nodules and masses. Index left retroperitoneal nodule on image 45 measures 2.4 cm. Numerous other small retroperitoneal/perinephric nodules bilaterally. Numerous subcutaneous nodules throughout the subcutaneous soft tissues of the abdomen and pelvis. Index right lateral subcutaneous nodule measures 1.8 cm on image 51. Index left anterior pelvic subcutaneous nodule measures 2.4 cm on image 68. Subcutaneous nodules within the inguinal regions bilaterally. Large right anterior subcutaneous nodule  superiorly in the abdomen over the in the liver measures 2.8 cm on image 27.  Uterus and adnexa have an unremarkable unenhanced appearance. Urinary bladder unremarkable. Moderate stool burden throughout the colon. Small bowel is decompressed. Aorta is normal caliber.  No acute bony abnormality or focal bone lesion.  IMPRESSION: Extensive metastatic disease including in the low lower lung zones bilaterally, bilateral adrenal glands, as well as numerous retroperitoneal and subcutaneous nodules.  Study is limited in evaluation of the solid organs without intravenous contrast.  Dominant mass in the right middle lobe at the right lung base.  Bibasilar atelectasis.   Electronically Signed   By: Rolm Baptise M.D.   On: 10/13/2014 16:49   Dg Chest 2 View  10/11/2014   CLINICAL DATA:  Chest pain  EXAM: CHEST  2 VIEW  COMPARISON:  CT from earlier in the same day  FINDINGS: Cardiac show remains elevated with right basilar atelectasis. A dominant right pulmonary nodule is noted as well as some smaller nodules similar to that seen on the prior CT examination. No sizable effusion is noted. No bony abnormality is seen.  IMPRESSION: Right basilar atelectasis with evidence of multiple pulmonary nodules.   Electronically Signed   By: Inez Catalina M.D.   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 Now with shortness of breath. History of DVT.  EXAM: CT ANGIOGRAPHY CHEST WITH CONTRAST  TECHNIQUE: Multidetector CT imaging of the chest was performed using the standard protocol during bolus administration of intravenous contrast. Multiplanar CT image reconstructions and MIPs were obtained to evaluate the vascular anatomy.  CONTRAST:  58mL OMNIPAQUE IOHEXOL 350 MG/ML SOLN  COMPARISON:  CT of the chest without contrast on 08/12/2014  FINDINGS: There has  been significant progression of metastatic lung carcinoma in the chest and visualized upper abdomen. The dominant right middle lobe mass now measures approximately 2.9 x 3.6 cm compared to approximately 2.0 x 2.2 cm on the prior study. A number of metastatic pulmonary nodules are identified in both lungs. Anterior right upper lobe nodule measures 1.2 cm. Superior segment right lower lobe nodule measures 0.5 cm. Nodule abutting the lower major fissure measures 1.3 cm. Right lower lobe peripheral nodule measures 0.7 cm. Several other smaller right lower lobe nodules present. There are at least 6 new pulmonary nodules in the left lower lobe with the largest measuring 1.1 cm. Stable 1.6 cm ground-glass opacity at the right lung apex.  Metastatic adenopathy in the mediastinum also shows significant progression since the prior CT. The dominant anterior mediastinal lymph node mass abutting both the ascending thoracic aorta and SVC now measures approximately 3.2 x 4.7 cm as compared to roughly 2.5 cm in short axis on the previous exam. Largest right peritracheal lymph node measures 1.6 cm in short  axis. Subcarinal lymph node mass measures 2.6 x 3.5 cm. Prevascular lymph node mass anterior to the aortic arch shows enlargement and measures 1.5 cm in short axis. Bilateral axillary lymph nodes also show enlargement with the largest left axillary lymph node measuring 2.3 cm in short axis and the largest right axillary lymph node measuring 1.6 cm in short axis.  Multiple metastatic subcutaneous nodules are identified and are completely new since the prior examination. The largest subcutaneous nodule is in the right anterior lower costal subcutaneous fat and measures 2.8 cm in diameter. This was not present previously. A number of other smaller subcutaneous nodules are present in the left anterior upper abdominal wall fat, fat abutting the chest wall laterally on the left and the subcutaneous fat overlying the left scapula.  The  visualized upper abdomen also shows evidence of bilateral adrenal metastasis which were visualized previously. The dominant right adrenal metastasis now measures approximately 3.4 x 3.6 cm compared to 2.7 cm in diameter on the prior study. One or 2 separate left adrenal metastases measure in conglomerate dimensions approximately 2.5 x 3.8 cm. New 1.2 cm nodule posterior to the left upper kidney in the perinephric fat is consistent with metastasis.  No evidence of pulmonary embolism. The thoracic aorta is normal in caliber and shows no evidence of dissection. Diffuse spondylosis of the thoracic spine present. There are no visualized bone metastases in the chest. No pleural effusions or pulmonary infiltrates. There is a small amount of pericardial fluid which appears more prominent compared to the prior CT.  Review of the MIP images confirms the above findings.  IMPRESSION: Marked progression of metastatic disease in the chest and upper abdomen, as detailed above. There is enlargement of the dominant right middle lobe lung carcinoma an a number of new metastatic nodules in both lungs. Metastatic adenopathy in the mediastinum and both axillary regions has increased in size since the prior CT. 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 There are a number of new subcutaneous metastatic nodules in the subcutaneous fat. New posterior left perinephric metastatic nodules also visualized. The patient presumably has not received any interval treatment since the prior CT. Recommend oncologic consultation. A formal PET scan would also be helpful to complete staging evaluation prior to initiation of any cancer treatment. There is no evidence of pulmonary embolism.  Findings were communicated directly to Dr. Canary Brim in the Emergency Department.   Electronically Signed   By: Aletta Edouard M.D.   On: 10/11/2014 14:30   Mr Jeri Cos DV Contrast  10/11/2014   CLINICAL DATA:  Initial encounter. New diagnosis lung cancer.  Right-sided headache, 3 days duration. Staging.  EXAM: MRI HEAD WITHOUT AND WITH CONTRAST  TECHNIQUE: Multiplanar, multiecho pulse sequences of the brain and surrounding structures were obtained without and with intravenous contrast.  CONTRAST:  29mL MULTIHANCE GADOBENATE DIMEGLUMINE 529 MG/ML IV SOLN  COMPARISON:  None.  FINDINGS: There are 3 metastatic lesions noted affecting the brain. Within the right lateral cerebellum, there is a 7 mm metastasis. This contains some petechial blood products. At the right parietal vertex, there is a 9 mm metastasis. This contains minimal petechial blood products. In the left posterior parietal lobe, there is a 5-6 mm metastasis.  No evidence of ischemic infarction. No evidence of chronic small vessel disease. No hydrocephalus. No extra-axial fluid collection. No calvarial lesion is seen.  In the right orbit, there is a well-circumscribed abnormality measuring approximately 1 cm in size. This probably represents a venous varix. I  cannot be certain this does not represent a metastasis to the extra-ocular muscles (superior rectus).  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 Right cerebellum. Right parietal vertex. Left posterior parietal region. Early petechial blood products in the right cerebellar and right parietal lesions.  Orbital lesion on the right associated with the superior rectus muscle. This could represent a venous varix or metastasis. This could be studied with CT or dedicated orbital MRI should differentiation be important.   Electronically Signed   By: Nelson Chimes M.D.   On: 10/11/2014 19:36   Dg Chest Port 1 View  10/15/2014   CLINICAL DATA:  Dyspnea.  EXAM: PORTABLE CHEST - 1 VIEW  COMPARISON:  10/11/2014  FINDINGS: The cardiac silhouette remains enlarged. There is persistent elevation of right hemidiaphragm, not significantly changed. Right-sided lung nodules are again seen, measuring approximately 3.9 cm in the right base  and 1.3 cm adjacent to the right hilum. Focal linear opacity in the lateral left mid lung is unchanged, likely platelike atelectasis. Right basilar atelectasis is again seen. No large pleural effusion is identified. No pneumothorax.  IMPRESSION: Stable appearance of the chest with right lung nodules/mass and bibasilar atelectasis.   Electronically Signed   By: Logan Bores   On: 10/15/2014 09:31   Dg Chest Port 1 View  09/23/2014   CLINICAL DATA:  Postop bronchoscopy.  EXAM: PORTABLE CHEST - 1 VIEW  COMPARISON:  08/12/2014  FINDINGS: Low lung volumes. Right lower lobe airspace opacity could be related to bronchoscopy or represent atelectasis. No pneumothorax. Previously seen mid right lung nodule not well visualized on today's study, likely due to the low volumes and airspace disease. Minimal left base atelectasis. Heart is borderline in size.  IMPRESSION: Low lung volumes with bibasilar opacities, likely atelectasis, right greater than left. No pneumothorax.   Electronically Signed   By: Rolm Baptise M.D.   On: 09/23/2014 13:10   Dg C-arm Bronchoscopy  09/23/2014   CLINICAL DATA: right middle lobe lung mass   C-ARM BRONCHOSCOPY  Fluoroscopy was utilized by the requesting physician.  No radiographic  interpretation.       ASSESSMENT: Kathyrn Lass:   AFib: Now in NSR.  Continue IV Amio until taking PO meds. On Plavix, but this is not adequate for stroke prevention. Patient with brain mets. Risk of anticoagulation may be too high.    Jettie Booze., MD  10/19/2014  8:41 AM

## 2014-10-19 NOTE — Progress Notes (Signed)
Assumed care of patient at 74 from [place], [place]. Agree with her assessment of patient. Patient in bed sleeping at this time, will continue to monitor.

## 2014-10-20 DIAGNOSIS — M542 Cervicalgia: Secondary | ICD-10-CM

## 2014-10-20 DIAGNOSIS — M549 Dorsalgia, unspecified: Secondary | ICD-10-CM

## 2014-10-20 DIAGNOSIS — E108 Type 1 diabetes mellitus with unspecified complications: Secondary | ICD-10-CM

## 2014-10-20 DIAGNOSIS — R1110 Vomiting, unspecified: Secondary | ICD-10-CM

## 2014-10-20 LAB — CBC
HCT: 34.5 % — ABNORMAL LOW (ref 36.0–46.0)
HEMOGLOBIN: 11.5 g/dL — AB (ref 12.0–15.0)
MCH: 23.1 pg — AB (ref 26.0–34.0)
MCHC: 33.3 g/dL (ref 30.0–36.0)
MCV: 69.3 fL — ABNORMAL LOW (ref 78.0–100.0)
Platelets: 151 10*3/uL (ref 150–400)
RBC: 4.98 MIL/uL (ref 3.87–5.11)
RDW: 13.6 % (ref 11.5–15.5)
WBC: 13.5 10*3/uL — ABNORMAL HIGH (ref 4.0–10.5)

## 2014-10-20 LAB — FUNGUS CULTURE W SMEAR: FUNGAL SMEAR: NONE SEEN

## 2014-10-20 LAB — GLUCOSE, CAPILLARY
Glucose-Capillary: 122 mg/dL — ABNORMAL HIGH (ref 70–99)
Glucose-Capillary: 132 mg/dL — ABNORMAL HIGH (ref 70–99)
Glucose-Capillary: 167 mg/dL — ABNORMAL HIGH (ref 70–99)
Glucose-Capillary: 150 mg/dL — ABNORMAL HIGH (ref 70–99)

## 2014-10-20 LAB — BASIC METABOLIC PANEL
Anion gap: 9 (ref 5–15)
BUN: 17 mg/dL (ref 6–23)
CALCIUM: 9.2 mg/dL (ref 8.4–10.5)
CO2: 30 mEq/L (ref 19–32)
Chloride: 91 mEq/L — ABNORMAL LOW (ref 96–112)
Creatinine, Ser: 0.91 mg/dL (ref 0.50–1.10)
GFR calc Af Amer: 83 mL/min — ABNORMAL LOW (ref >90)
GFR calc non Af Amer: 72 mL/min — ABNORMAL LOW (ref >90)
GLUCOSE: 120 mg/dL — AB (ref 70–99)
Potassium: 4.2 mEq/L (ref 3.7–5.3)
SODIUM: 130 meq/L — AB (ref 137–147)

## 2014-10-20 MED ORDER — METOCLOPRAMIDE HCL 5 MG PO TABS
5.0000 mg | ORAL_TABLET | Freq: Three times a day (TID) | ORAL | Status: DC
  Administered 2014-10-20 – 2014-10-26 (×19): 5 mg via ORAL
  Filled 2014-10-20 (×20): qty 1

## 2014-10-20 MED ORDER — SODIUM CHLORIDE 0.9 % IV BOLUS (SEPSIS)
250.0000 mL | Freq: Once | INTRAVENOUS | Status: AC
  Administered 2014-10-20: 250 mL via INTRAVENOUS

## 2014-10-20 NOTE — Unmapped (Signed)
Received medical records from [facility]. Pt has new pt appt on 10/27/14 with Dr. Lilli Light. Put in [facility] mailbox to be reviewed. Kathy Camacho 10/20/2014 2:08 PM

## 2014-10-20 NOTE — Progress Notes (Signed)
[facility] PROGRESS NOTE  Sabrina Adkins XIH:[id number] DOB: 11-07-1961 DOA: 10/11/2014  PCP: Philis Fendt, MD  Brief HPI: Sabrina Adkins is a 53 y.o. 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. She presented with painful multiple subcutaneous nodules beneath her breasts, and the anterior and shoulder area and upper abdominal wall. CT chest angiogram revealed rapid progression of her lung cancer, withsubcutaneous nodules actually being metastatic disease, but having intra-abdominal metastasis, as well. She was admitted for chemotherapy. Patient then developed supraventricular tachycardia with heart rate being in the 170s. Seen by cardiology and started on Amiodarone. She was given chemotherapy with Gemzar and Carboplatin on 11/4. Plan is to repeat chemo 11/11.  Past medical history:  Past Medical History  Diagnosis Date  . Diabetes mellitus without complication   . Anxiety   . Clotting disorder     right leg DVT  . Coagulopathy 09/02/2014  . Hypertension   . Shortness of breath     "when I am getting ready to lay down"  . Pneumonia     2015  . Depression   . Bipolar disorder   . GERD (gastroesophageal reflux disease)   . Arthritis     RA  . DVT (deep venous thrombosis)     > 10 years ago, was on coumadin  . Cancer 10/12/2014    metastatic  lung    Consultants: Oncology, Cardiology  Procedures:  2 D ECHO Study Conclusions - Left ventricle: The cavity size was normal. Wall thickness wasnormal. Systolic function was normal. The estimated ejectionfraction was in the range of 55% to 60%. Wall motion was normal;there were no regional wall motion abnormalities. Leftventricular diastolic function parameters were normal. - Pericardium, extracardiac: A trivial pericardial effusion  wasidentified.  LE Venous Dopplers No DVT  Antibiotics: None  Subjective: Patient feels well. Had an episode of vomiting last night after supper. Denies nausea at this time.   Objective: Vital Signs  Filed Vitals:   10/19/14 1514 10/19/14 1700 10/19/14 2227 10/20/14 0510  BP: 113/83 165/58 98/70 115/62  Pulse: 75 66 108 84  Temp: 98.1 F (36.7 C)  97.6 F (36.4 C) 97.3 F (36.3 C)  TempSrc: Oral  Oral Oral  Resp: 18  20 16   Height:      Weight:      SpO2: 100%  98% 96%    Intake/Output Summary (Last 24 hours) at 10/20/14 0937 Last data filed at 10/20/14 0700  Gross per 24 hour  Intake 667.47 ml  Output      0 ml  Net 667.47 ml   Filed Weights   10/11/14 2029 10/12/14 0440 10/12/14 1722  Weight: 113.717 kg (250 lb 11.2 oz) 113.7 kg (250 lb 10.6 oz) 113 kg (249 lb 1.9 oz)    General appearance: alert, cooperative, appears stated age and no distress. Mildly edematous. Resp: No definite crackles. No wheezing. Cardio: regular rate and rhythm, S1, S2 normal, no murmur, click, rub or gallop GI: soft, non-tender; bowel sounds normal; no masses,  no organomegaly Improved edema bilateral LE . Neurologic: no focal deficits  Lab Results:  Basic Metabolic Panel:  Recent Labs Lab 10/16/14 0438 10/17/14 0459 10/18/14 0520 10/19/14 0540 10/20/14 0423  NA 130* 135* 131* 130* 130*  K 4.1 4.4 4.5 4.2 4.2  CL 91* 94* 92* 93* 91*  CO2 29 28 29  26  30  GLUCOSE 236* 265* 198* 225* 120*  BUN 21 21 21 16 17   CREATININE 0.78 0.76 0.84 0.77 0.91  CALCIUM 9.6 9.5 9.5 8.7 9.2   Liver Function Tests:  Recent Labs Lab 10/17/14 0459  AST 24  ALT 21  ALKPHOS 66  BILITOT 0.7  PROT 5.4*  ALBUMIN 2.3*   CBC:  Recent Labs Lab 10/16/14 0438 10/17/14 0459 10/18/14 0520 10/19/14 0540 10/20/14 0423  WBC 21.4* 18.8* 17.0* 10.6* 13.5*  HGB 12.5 11.8* 12.6 11.2* 11.5*  HCT 37.3 35.3* 36.6 33.0* 34.5*  MCV 69.9* 69.5* 69.2* 69.0* 69.3*  PLT 184 170 188 157 151    CBG:  Recent Labs Lab 10/19/14 0802 10/19/14 1222 10/19/14 1701 10/19/14 2226 10/20/14 0700  GLUCAP 192* 237* 253* 132* 122*    Recent Results (from the past 240 hour(s))  Urine culture     Status: None   Collection Time: 10/11/14 12:33 PM  Result Value Ref Range Status   Specimen Description URINE, CLEAN CATCH  Final   Special Requests NONE  Final   Culture  Setup Time   Final    10/12/2014 03:08 Performed at [facility]   Final    75,000 COLONIES/ML Performed at [facility]    Culture   Final    Multiple bacterial morphotypes present, none predominant. Suggest appropriate recollection if clinically indicated. Performed at [facility]    Report Status 10/13/2014 FINAL  Final  MRSA PCR Screening     Status: None   Collection Time: 10/12/14 10:29 AM  Result Value Ref Range Status   MRSA by PCR NEGATIVE NEGATIVE Final    Comment:        The GeneXpert MRSA Assay (FDA approved for NASAL specimens only), is one component of a comprehensive MRSA colonization surveillance program. It is not intended to diagnose MRSA infection nor to guide or monitor treatment for MRSA infections.       Studies/Results: No results found.  Medications:  Scheduled: . amiodarone  400 mg Oral Daily  . clopidogrel  75 mg Oral Daily  . docusate sodium  100 mg Oral BID  . gabapentin  100 mg Oral TID  . heparin  5,000 Units Subcutaneous 3 times per day  . insulin aspart  0-20 Units Subcutaneous TID WC  . insulin aspart  0-5 Units Subcutaneous QHS  . insulin aspart  3 Units Subcutaneous TID WC  . insulin glargine  18 Units Subcutaneous Daily  . insulin glargine  35 Units Subcutaneous QHS  . metoCLOPramide  5 mg Oral TID AC  . metoprolol tartrate  25 mg Oral QID  . multivitamin with minerals  1 tablet Oral Daily  . QUEtiapine  50 mg Oral QHS  . senna  1 tablet Oral BID   Continuous: . sodium chloride 10 mL/hr at 10/16/14 2116    GUY:[id number] **OR** acetaminophen, albuterol, ALPRAZolam, alum & mag hydroxide-simeth, bisacodyl, morphine injection, ondansetron **OR** ondansetron (ZOFRAN) IV, oxyCODONE, polyethylene glycol, promethazine, sodium chloride, sodium chloride, sodium chloride  Assessment/Plan:  Active Problems:   Metastatic lung cancer (metastasis from lung to other site)   DM (diabetes mellitus)   HTN (hypertension)   UTI (lower urinary tract infection)   Supraventricular tachycardia   PAF (paroxysmal atrial fibrillation)   Ventricular tachycardia   Metastatic disease   Paroxysmal atrial fibrillation   Type 2 diabetes mellitus with complication   Nausea and vomiting   Pedal edema    Metastatic  Sabrina Adkins She has new brain mets on brain MRI. CT abd/pelvis revealed intra-abdominal mets as well. Received Chemo with Gemzar and Carboplatin 11/4. Oncology following and plan to give next treatment on 11/11. Radiation to brain to be pursued at later date. Discussed prognosis with Dr. Julien Nordmann. It is poor and life expectancy is likely 3-6 months at the most. Swelling likely due to nutritional status and improved with Lasix. LE dopplers neg for DVT.   Nausea and Vomiting Continues to have vomiting. Was likely due to chemo. Will try Reglan before meals.  Abdomen is benign.   Pedal Edema Improved. No more lasix for now.   SVT/PAF Cardiology following and was on Amiodarone infusion for many days. HR continues to spike once in a while. She was changed to oral Amiodarone 11/9 after discussing with Dr. Irish Lack. On BB and Plavix as well. Plavix doesn't protect against CVA but considering her poor prgonosis, will not pursue anticoagulation. Plus her brain mets make her very high risk.  UTI Completed course of levaquin. 11/1 Urine cx with 75,000 multiple morphotypes present, none predominant.   Leukocytosis WBC almost normal. She remains afebrile. This was possibly due to to malignancy. No obvious infectious  source apart from UTI. And UTI did not appear to be that significant.   Essential HTN Stable, controlled. Continue current regimen.  Diabetes mellitus Type 2 Poorly controlled but improved after adjustments made to lantus dosing. Continue Lantus and SSI. A1c was 9.7 in September. May need to make further adjustments as her oral intake improves. But CBG's are better.  DVT Prophylaxis: Heparin SQ    Code Status: Full Code  Family Communication: Discussed with patient. Disposition Plan: Daughter plans to take her to [place] this weekend. Daughter has found a PCP for her there and medical records are in the process of being sent over. Dr. Argentina Donovan. Office- [phone]. Fax- [phone].    LOS: 9 days   [facility] Pager [phone] 10/20/2014, 9:37 AM  If 8PM-8AM, please contact night-coverage at [email or web address], password [facility]

## 2014-10-20 NOTE — Progress Notes (Signed)
Assumed care of pt at this time. Pt stable with no complaints. Agree with this am's assessment. Will continue to monitor.  Othella Boyer [facility]

## 2014-10-20 NOTE — Progress Notes (Signed)
SUBJECTIVE:  Palpitations are less frequent.  Appetite is better.  One episode of vomiting last night.  OBJECTIVE:   Vitals:   Filed Vitals:   10/19/14 1514 10/19/14 1700 10/19/14 2227 10/20/14 0510  BP: 113/83 165/58 98/70 115/62  Pulse: 75 66 108 84  Temp: 98.1 F (36.7 C)  97.6 F (36.4 C) 97.3 F (36.3 C)  TempSrc: Oral  Oral Oral  Resp: 18  20 16   Height:      Weight:      SpO2: 100%  98% 96%   I&O's:    Intake/Output Summary (Last 24 hours) at 10/20/14 0640 Last data filed at 10/19/14 2300  Gross per 24 hour  Intake 1168.82 ml  Output      0 ml  Net 1168.82 ml   TELEMETRY: Reviewed telemetry pt in NSR, PAF:     PHYSICAL EXAM General: Well developed, well nourished, in no acute distress Head:   Normal cephalic and atramatic  Lungs:  No wheeze Heart:   HRRR    Abdomen: obese  Neuro: Alert and oriented. Psych:  Normal affect, responds appropriately Skin: No rash   LABS: Basic Metabolic Panel:  Recent Labs  10/19/14 0540 10/20/14 0423  NA 130* 130*  K 4.2 4.2  CL 93* 91*  CO2 26 30  GLUCOSE 225* 120*  BUN 16 17  CREATININE 0.77 0.91  CALCIUM 8.7 9.2   Liver Function Tests: No results for input(s): AST, ALT, ALKPHOS, BILITOT, PROT, ALBUMIN in the last 72 hours. No results for input(s): LIPASE, AMYLASE in the last 72 hours. CBC:  Recent Labs  10/19/14 0540 10/20/14 0423  WBC 10.6* 13.5*  HGB 11.2* 11.5*  HCT 33.0* 34.5*  MCV 69.0* 69.3*  PLT 157 151   Cardiac Enzymes: No results for input(s): CKTOTAL, CKMB, CKMBINDEX, TROPONINI in the last 72 hours. BNP: Invalid input(s): POCBNP D-Dimer: No results for input(s): DDIMER in the last 72 hours. Hemoglobin A1C: No results for input(s): HGBA1C in the last 72 hours. Fasting Lipid Panel: No results for input(s): CHOL, HDL, LDLCALC, TRIG, CHOLHDL, LDLDIRECT in the last 72 hours. Thyroid Function Tests: No results for input(s): TSH, T4TOTAL, T3FREE, THYROIDAB in the last 72  hours.  Invalid input(s): FREET3 Anemia Panel: No results for input(s): VITAMINB12, FOLATE, FERRITIN, TIBC, IRON, RETICCTPCT in the last 72 hours. Coag Panel:   Lab Results  Component Value Date   INR 1.08 09/02/2014   INR 2.40* 08/25/2014   PROTIME * 09/02/2014    RADIOLOGY: Ct Abdomen Pelvis Wo Contrast  10/13/2014   CLINICAL DATA:  Newly diagnosed metastatic lung cancer. No abdominal plate.  EXAM: CT ABDOMEN AND PELVIS WITHOUT CONTRAST  TECHNIQUE: Multidetector CT imaging of the abdomen and pelvis was performed following the standard protocol without IV contrast.  COMPARISON:  Chest CT 10/11/2014  FINDINGS: Right middle lobe mass at the right lung base again noted, measuring up to 3.8 cm compared with 3.6 cm. The slight change likely related to differences in scan planes. Other smaller bilateral lower lobe nodules are noted as measured on recent chest CT. Small right pleural effusion. Atelectasis in the right lower lobe and right middle lobe at the right lung base. Heart is normal size.  Numerous subcutaneous and chest wall nodules noted, the largest in the left lower chest wall measuring up to 2.4 cm, stable.  3.2 cm right adrenal mass. Left adrenal mass measures up to 4.1 cm. These slight differences likely reflect slight differences in scan planes. No definite  focal hepatic abnormality on this unenhanced study. Spleen, pancreas have an unremarkable unenhanced appearance.  Innumerable retroperitoneal nodules and masses. Index left retroperitoneal nodule on image 45 measures 2.4 cm. Numerous other small retroperitoneal/perinephric nodules bilaterally. Numerous subcutaneous nodules throughout the subcutaneous soft tissues of the abdomen and pelvis. Index right lateral subcutaneous nodule measures 1.8 cm on image 51. Index left anterior pelvic subcutaneous nodule measures 2.4 cm on image 68. Subcutaneous nodules within the inguinal regions bilaterally. Large right anterior subcutaneous nodule  superiorly in the abdomen over the in the liver measures 2.8 cm on image 27.  Uterus and adnexa have an unremarkable unenhanced appearance. Urinary bladder unremarkable. Moderate stool burden throughout the colon. Small bowel is decompressed. Aorta is normal caliber.  No acute bony abnormality or focal bone lesion.  IMPRESSION: Extensive metastatic disease including in the low lower lung zones bilaterally, bilateral adrenal glands, as well as numerous retroperitoneal and subcutaneous nodules.  Study is limited in evaluation of the solid organs without intravenous contrast.  Dominant mass in the right middle lobe at the right lung base.  Bibasilar atelectasis.   Electronically Signed   By: Rolm Baptise M.D.   On: 10/13/2014 16:49   Dg Chest 2 View  10/11/2014   CLINICAL DATA:  Chest pain  EXAM: CHEST  2 VIEW  COMPARISON:  CT from earlier in the same day  FINDINGS: Cardiac show remains elevated with right basilar atelectasis. A dominant right pulmonary nodule is noted as well as some smaller nodules similar to that seen on the prior CT examination. No sizable effusion is noted. No bony abnormality is seen.  IMPRESSION: Right basilar atelectasis with evidence of multiple pulmonary nodules.   Electronically Signed   By: Inez Catalina M.D.   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 Now with shortness of breath. History of DVT.  EXAM: CT ANGIOGRAPHY CHEST WITH CONTRAST  TECHNIQUE: Multidetector CT imaging of the chest was performed using the standard protocol during bolus administration of intravenous contrast. Multiplanar CT image reconstructions and MIPs were obtained to evaluate the vascular anatomy.  CONTRAST:  89mL OMNIPAQUE IOHEXOL 350 MG/ML SOLN  COMPARISON:  CT of the chest without contrast on 08/12/2014  FINDINGS: There has  been significant progression of metastatic lung carcinoma in the chest and visualized upper abdomen. The dominant right middle lobe mass now measures approximately 2.9 x 3.6 cm compared to approximately 2.0 x 2.2 cm on the prior study. A number of metastatic pulmonary nodules are identified in both lungs. Anterior right upper lobe nodule measures 1.2 cm. Superior segment right lower lobe nodule measures 0.5 cm. Nodule abutting the lower major fissure measures 1.3 cm. Right lower lobe peripheral nodule measures 0.7 cm. Several other smaller right lower lobe nodules present. There are at least 6 new pulmonary nodules in the left lower lobe with the largest measuring 1.1 cm. Stable 1.6 cm ground-glass opacity at the right lung apex.  Metastatic adenopathy in the mediastinum also shows significant progression since the prior CT. The dominant anterior mediastinal lymph node mass abutting both the ascending thoracic aorta and SVC now measures approximately 3.2 x 4.7 cm as compared to roughly 2.5 cm in short axis on the previous exam. Largest right peritracheal lymph node measures 1.6 cm in short axis. Subcarinal lymph node mass measures 2.6 x 3.5  cm. Prevascular lymph node mass anterior to the aortic arch shows enlargement and measures 1.5 cm in short axis. Bilateral axillary lymph nodes also show enlargement with the largest left axillary lymph node measuring 2.3 cm in short axis and the largest right axillary lymph node measuring 1.6 cm in short axis.  Multiple metastatic subcutaneous nodules are identified and are completely new since the prior examination. The largest subcutaneous nodule is in the right anterior lower costal subcutaneous fat and measures 2.8 cm in diameter. This was not present previously. A number of other smaller subcutaneous nodules are present in the left anterior upper abdominal wall fat, fat abutting the chest wall laterally on the left and the subcutaneous fat overlying the left scapula.  The  visualized upper abdomen also shows evidence of bilateral adrenal metastasis which were visualized previously. The dominant right adrenal metastasis now measures approximately 3.4 x 3.6 cm compared to 2.7 cm in diameter on the prior study. One or 2 separate left adrenal metastases measure in conglomerate dimensions approximately 2.5 x 3.8 cm. New 1.2 cm nodule posterior to the left upper kidney in the perinephric fat is consistent with metastasis.  No evidence of pulmonary embolism. The thoracic aorta is normal in caliber and shows no evidence of dissection. Diffuse spondylosis of the thoracic spine present. There are no visualized bone metastases in the chest. No pleural effusions or pulmonary infiltrates. There is a small amount of pericardial fluid which appears more prominent compared to the prior CT.  Review of the MIP images confirms the above findings.  IMPRESSION: Marked progression of metastatic disease in the chest and upper abdomen, as detailed above. There is enlargement of the dominant right middle lobe lung carcinoma an a number of new metastatic nodules in both lungs. Metastatic adenopathy in the mediastinum and both axillary regions has increased in size since the prior CT. 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 There are a number of new subcutaneous metastatic nodules in the subcutaneous fat. New posterior left perinephric metastatic nodules also visualized. The patient presumably has not received any interval treatment since the prior CT. Recommend oncologic consultation. A formal PET scan would also be helpful to complete staging evaluation prior to initiation of any cancer treatment. There is no evidence of pulmonary embolism.  Findings were communicated directly to Dr. Canary Brim in the Emergency Department.   Electronically Signed   By: Aletta Edouard M.D.   On: 10/11/2014 14:30   Mr Jeri Cos WG Contrast  10/11/2014   CLINICAL DATA:  Initial encounter. New diagnosis lung cancer.  Right-sided headache, 3 days duration. Staging.  EXAM: MRI HEAD WITHOUT AND WITH CONTRAST  TECHNIQUE: Multiplanar, multiecho pulse sequences of the brain and surrounding structures were obtained without and with intravenous contrast.  CONTRAST:  16mL MULTIHANCE GADOBENATE DIMEGLUMINE 529 MG/ML IV SOLN  COMPARISON:  None.  FINDINGS: There are 3 metastatic lesions noted affecting the brain. Within the right lateral cerebellum, there is a 7 mm metastasis. This contains some petechial blood products. At the right parietal vertex, there is a 9 mm metastasis. This contains minimal petechial blood products. In the left posterior parietal lobe, there is a 5-6 mm metastasis.  No evidence of ischemic infarction. No evidence of chronic small vessel disease. No hydrocephalus. No extra-axial fluid collection. No calvarial lesion is seen.  In the right orbit, there is a well-circumscribed abnormality measuring approximately 1 cm in size. This probably represents a venous varix. I cannot be certain this does not represent a metastasis  to the extra-ocular muscles (superior rectus).  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 Right cerebellum. Right parietal vertex. Left posterior parietal region. Early petechial blood products in the right cerebellar and right parietal lesions.  Orbital lesion on the right associated with the superior rectus muscle. This could represent a venous varix or metastasis. This could be studied with CT or dedicated orbital MRI should differentiation be important.   Electronically Signed   By: Nelson Chimes M.D.   On: 10/11/2014 19:36   Dg Chest Port 1 View  10/15/2014   CLINICAL DATA:  Dyspnea.  EXAM: PORTABLE CHEST - 1 VIEW  COMPARISON:  10/11/2014  FINDINGS: The cardiac silhouette remains enlarged. There is persistent elevation of right hemidiaphragm, not significantly changed. Right-sided lung nodules are again seen, measuring approximately 3.9 cm in the right base  and 1.3 cm adjacent to the right hilum. Focal linear opacity in the lateral left mid lung is unchanged, likely platelike atelectasis. Right basilar atelectasis is again seen. No large pleural effusion is identified. No pneumothorax.  IMPRESSION: Stable appearance of the chest with right lung nodules/mass and bibasilar atelectasis.   Electronically Signed   By: Logan Bores   On: 10/15/2014 09:31   Dg Chest Port 1 View  09/23/2014   CLINICAL DATA:  Postop bronchoscopy.  EXAM: PORTABLE CHEST - 1 VIEW  COMPARISON:  08/12/2014  FINDINGS: Low lung volumes. Right lower lobe airspace opacity could be related to bronchoscopy or represent atelectasis. No pneumothorax. Previously seen mid right lung nodule not well visualized on today's study, likely due to the low volumes and airspace disease. Minimal left base atelectasis. Heart is borderline in size.  IMPRESSION: Low lung volumes with bibasilar opacities, likely atelectasis, right greater than left. No pneumothorax.   Electronically Signed   By: Rolm Baptise M.D.   On: 09/23/2014 13:10   Dg C-arm Bronchoscopy  09/23/2014   CLINICAL DATA: right middle lobe lung mass   C-ARM BRONCHOSCOPY  Fluoroscopy was utilized by the requesting physician.  No radiographic  interpretation.       ASSESSMENT: Kathyrn Lass:   AFib: Now in NSR.  Switched to PO Amio yesterday. Continue this as vomiting is decreasing in frequency.   On Plavix, but this is not adequate for stroke prevention. Patient with brain mets. Risk of anticoagulation may be too high.  Would await oncology recs as to whether anticoagulation would be appropriate given brain mets.  Jettie Booze., MD  10/20/2014  6:40 AM

## 2014-10-20 NOTE — Plan of Care (Signed)
Problem: Phase III Progression Outcomes Goal: Sinus rhythm established or heart rate < 100 at rest Outcome: Completed/Met Date Met:  10/20/14 Goal: Pain controlled on oral analgesia Outcome: Completed/Met Date Met:  10/20/14 Goal: Activity at appropriate level-compared to baseline (UP IN CHAIR FOR HEMODIALYSIS)  Outcome: Completed/Met Date Met:  10/20/14

## 2014-10-20 NOTE — Progress Notes (Signed)
Inpatient Diabetes Program Recommendations  AACE/ADA: New Consensus Statement on Inpatient Glycemic Control (2013)  Target Ranges:  Prepandial:   less than 140 mg/dL      Peak postprandial:   less than 180 mg/dL (1-2 hours)      Critically ill patients:  140 - 180 mg/dL  Results for SUNDAY, KLOS (MRN [id number]) as of 10/20/2014 11:10  Ref. Range 10/19/2014 08:02 10/19/2014 12:22 10/19/2014 17:01 10/19/2014 22:26 10/20/2014 07:00  Glucose-Capillary Latest Range: 70-99 mg/dL 192 (H) 237 (H) 253 (H) 132 (H) 122 (H)   Consider increasing Novolog meal coverage to 5 units TID for elevated postprandial CBGs. Thank you  Raoul Pitch BSN, RN,CDE Inpatient Diabetes Coordinator [phone] (team pager)

## 2014-10-20 NOTE — Progress Notes (Signed)
Central telemetry called to let me know pt has been going in and out of aflutter through the night, NSR now. Strips saved in tele

## 2014-10-20 NOTE — Progress Notes (Signed)
Subjective: The patient is seen and examined today. She is feeling much better after starting the first cycle of her systemic chemotherapy was carboplatin and gemcitabine. She had 1 episode of vomiting last night. Her daughter is currently in town from [place] in preparation to move the patient to [place] to be close to her family. She denied having any fever or chills.  Objective: Vital signs in last 24 hours: Temp:  [97.3 F (36.3 C)-98.1 F (36.7 C)] 97.3 F (36.3 C) (11/10 0510) Pulse Rate:  [66-108] 84 (11/10 0510) Resp:  [16-20] 16 (11/10 0510) BP: (98-165)/(58-83) 115/62 mmHg (11/10 0510) SpO2:  [96 %-100 %] 96 % (11/10 0510)  Intake/Output from previous day: 11/09 0701 - 11/10 0700 In: 907.5 [P.O.:480; I.V.:427.5] Out: -  Intake/Output this shift:    General appearance: alert, cooperative, fatigued and no distress Resp: clear to auscultation bilaterally Cardio: regular rate and rhythm, S1, S2 normal, no murmur, click, rub or gallop GI: soft, non-tender; bowel sounds normal; no masses,  no organomegaly Extremities: extremities normal, atraumatic, no cyanosis or edema  Lab Results:   Recent Labs  10/19/14 0540 10/20/14 0423  WBC 10.6* 13.5*  HGB 11.2* 11.5*  HCT 33.0* 34.5*  PLT 157 151   BMET  Recent Labs  10/19/14 0540 10/20/14 0423  NA 130* 130*  K 4.2 4.2  CL 93* 91*  CO2 26 30  GLUCOSE 225* 120*  BUN 16 17  CREATININE 0.77 0.91  CALCIUM 8.7 9.2    Studies/Results: No results found.  Medications: I have reviewed the patient's current medications.   Assessment/Plan: This is a very pleasant 53 years old African-American female who was recently diagnosed with metastatic non-small cell lung cancer, squamous cell carcinoma. She is currently undergoing systemic chemotherapy with carboplatin and gemcitabine status post day 1 of the first cycle and tolerated her treatment fairly well. We will proceed with day 8 of the first cycle tomorrow as  scheduled. The patient has planned to move back to [facility] to be close to her family and she will resume her systemic therapy there after discharge from the hospital. Thank you for taking good care of Sabrina Adkins, I will continue to follow up the patient with you and assist in her management on as-needed basis.   LOS: 9 days    Sabrina Adkins K. 10/20/2014

## 2014-10-20 NOTE — Progress Notes (Signed)
Occupational Therapy Treatment Patient Details Name: Sabrina Adkins MRN: [id number] DOB: [birth year] Today's Date: 10/20/2014    History of present illness 53 yo female admitted with metastatic lung cancer, new brain lesion, SVT, PAF. Hx of DM, anxiety, clotting d/o, HTN, bipolar d/o, DVT, recent diagnosis of lung cancer.    OT comments  Pt will need a WC, RW and 3 n 1 upon DC. Daughter plans to drive pt to [place].  Pt not able to fly I ly and family can only purchase 1 plane ticket. Pt would not be able to Ily take herself to bathroom      Equipment Recommendations  Wheelchair (measurements OT);Other (comment) (RW)       Precautions / Restrictions Precautions Precautions: Fall       Mobility Bed Mobility   Bed Mobility: Sit to Supine     Supine to sit: Supervision Sit to supine: Supervision   General bed mobility comments: increased time,   Transfers Overall transfer level: Needs assistance Equipment used: 1 person hand held assist Transfers: Sit to/from Stand Sit to Stand: Min assist Stand pivot transfers: Min assist                ADL                           Toilet Transfer: Minimal assistance;BSC   Toileting- Clothing Manipulation and Hygiene: Sit to/from stand;Minimal assistance         General ADL Comments: VC for safety.   Much of of OT session spent focusing on DC plan.  Familly planning on flying pt alone from [place] to [place].  OT explained process of getting through airport not safe for pt to do I ly.  Pt not I at this time.  Pt would need help in the airport.  Shared concern with pt, daughter and care manager.                  Cognition   Behavior During Therapy: WFL for tasks assessed/performed Overall Cognitive Status: Within Functional Limits for tasks assessed                               General Comments      Pertinent Vitals/ Pain       Faces Pain Scale: Hurts a little bit Pain Location: left tummy area Pain  Descriptors / Indicators: Sore Pain Intervention(s): Limited activity within patient's tolerance;Monitored during session     Prior Functioning/Environment              Frequency       Progress Toward Goals  OT Goals(current goals can now be found in the care plan section)      progressing      End of Session  in bed with call bell and family present   Activity Tolerance  pt fatigued              Time: 0321-2248 OT Time Calculation (min): 23 min  Charges: OT General Charges $OT Visit: 1 Procedure OT Treatments $Self Care/Home Management : 23-37 mins  Marrion Finan, Thereasa Parkin 10/20/2014, 2:08 PM

## 2014-10-21 ENCOUNTER — Ambulatory Visit: Payer: Medicaid Other | Admitting: Internal Medicine

## 2014-10-21 ENCOUNTER — Other Ambulatory Visit: Payer: Medicaid Other

## 2014-10-21 ENCOUNTER — Inpatient Hospital Stay (HOSPITAL_COMMUNITY): Payer: Medicaid Other

## 2014-10-21 DIAGNOSIS — E08329 Diabetes mellitus due to underlying condition with mild nonproliferative diabetic retinopathy without macular edema: Secondary | ICD-10-CM

## 2014-10-21 LAB — CBC
HEMATOCRIT: 32.9 % — AB (ref 36.0–46.0)
HEMOGLOBIN: 10.9 g/dL — AB (ref 12.0–15.0)
MCH: 23 pg — ABNORMAL LOW (ref 26.0–34.0)
MCHC: 33.1 g/dL (ref 30.0–36.0)
MCV: 69.6 fL — AB (ref 78.0–100.0)
Platelets: 110 10*3/uL — ABNORMAL LOW (ref 150–400)
RBC: 4.73 MIL/uL (ref 3.87–5.11)
RDW: 13.6 % (ref 11.5–15.5)
WBC: 16.8 10*3/uL — AB (ref 4.0–10.5)

## 2014-10-21 LAB — GLUCOSE, CAPILLARY
GLUCOSE-CAPILLARY: 173 mg/dL — AB (ref 70–99)
GLUCOSE-CAPILLARY: 129 mg/dL — AB (ref 70–99)
GLUCOSE-CAPILLARY: 167 mg/dL — AB (ref 70–99)

## 2014-10-21 LAB — URINALYSIS, ROUTINE W REFLEX MICROSCOPIC
Bilirubin Urine: NEGATIVE
Glucose, UA: NEGATIVE mg/dL
KETONES UR: NEGATIVE mg/dL
NITRITE: NEGATIVE
Protein, ur: NEGATIVE mg/dL
SPECIFIC GRAVITY, URINE: 1.009 (ref 1.005–1.030)
UROBILINOGEN UA: 0.2 mg/dL (ref 0.0–1.0)
pH: 6 (ref 5.0–8.0)

## 2014-10-21 LAB — BASIC METABOLIC PANEL
Anion gap: 8 (ref 5–15)
BUN: 12 mg/dL (ref 6–23)
CHLORIDE: 96 meq/L (ref 96–112)
CO2: 30 meq/L (ref 19–32)
Calcium: 9.3 mg/dL (ref 8.4–10.5)
Creatinine, Ser: 0.86 mg/dL (ref 0.50–1.10)
GFR calc Af Amer: 89 mL/min — ABNORMAL LOW (ref >90)
GFR calc non Af Amer: 77 mL/min — ABNORMAL LOW (ref >90)
GLUCOSE: 122 mg/dL — AB (ref 70–99)
Potassium: 4.4 mEq/L (ref 3.7–5.3)
Sodium: 134 mEq/L — ABNORMAL LOW (ref 137–147)

## 2014-10-21 LAB — URINE MICROSCOPIC-ADD ON

## 2014-10-21 LAB — HIV ANTIBODY (ROUTINE TESTING W REFLEX): HIV 1&2 Ab, 4th Generation: NONREACTIVE

## 2014-10-21 MED ORDER — COLD PACK MISC ONCOLOGY
1.0000 | Freq: Once | Status: AC | PRN
  Filled 2014-10-21: qty 1

## 2014-10-21 MED ORDER — SODIUM CHLORIDE 0.9 % IV SOLN: Freq: Once | INTRAVENOUS | Status: DC

## 2014-10-21 MED ORDER — GEMCITABINE HCL CHEMO INJECTION 1 GM/26.3ML
1000.0000 mg/m2 | Freq: Once | INTRAVENOUS | Status: AC
  Administered 2014-10-21: 2280 mg via INTRAVENOUS
  Filled 2014-10-21: qty 60

## 2014-10-21 MED ORDER — PROCHLORPERAZINE MALEATE 10 MG PO TABS
10.0000 mg | ORAL_TABLET | Freq: Once | ORAL | Status: AC
  Administered 2014-10-21: 10 mg via ORAL
  Filled 2014-10-21: qty 1

## 2014-10-21 MED ORDER — SODIUM CHLORIDE 0.9 % IJ SOLN: 3.0000 mL | INTRAMUSCULAR | Status: DC | PRN

## 2014-10-21 MED ORDER — METRONIDAZOLE 500 MG PO TABS
1000.0000 mg | ORAL_TABLET | Freq: Two times a day (BID) | ORAL | Status: AC
  Administered 2014-10-21 – 2014-10-22 (×2): 1000 mg via ORAL
  Filled 2014-10-21 (×2): qty 2

## 2014-10-21 MED ORDER — SODIUM CHLORIDE 0.9 % IJ SOLN: 10.0000 mL | INTRAMUSCULAR | Status: DC | PRN

## 2014-10-21 MED ORDER — HEPARIN SOD (PORK) LOCK FLUSH 100 UNIT/ML IV SOLN: 250.0000 [IU] | Freq: Once | INTRAVENOUS | Status: AC | PRN

## 2014-10-21 MED ORDER — HEPARIN SOD (PORK) LOCK FLUSH 100 UNIT/ML IV SOLN: 500.0000 [IU] | Freq: Once | INTRAVENOUS | Status: AC | PRN

## 2014-10-21 MED ORDER — ALTEPLASE 2 MG IJ SOLR: 2.0000 mg | Freq: Once | INTRAMUSCULAR | Status: AC | PRN

## 2014-10-21 NOTE — Progress Notes (Signed)
Patient tolerated 2nd dose of Gemzar without difficulty.  Unable to provide meaningful teaching as patient very sedated and family not present at this time.

## 2014-10-21 NOTE — Progress Notes (Signed)
PT Cancellation Note  Patient Details Name: Sabrina Adkins MRN: [id number] DOB: 1961-04-17   Cancelled Treatment:    Reason Eval/Treat Not Completed: Fatigue/lethargy limiting ability to participate--pt declined to participate with therapy on today. Pt stated she had chemotherapy earlier and that she is too tired. Will check back another day. Thanks.    Weston Anna, MPT Pager: [phone]

## 2014-10-21 NOTE — Clinical Documentation Improvement (Signed)
Abnormal Lab and/or Testing Results: Component     Latest Ref Rng 10/15/2014 10/16/2014 10/17/2014 10/18/2014 10/19/2014  Sodium     137 - 147 mEq/L 134 (L) 130 (L) 135 (L) 131 (L) 130 (L)   Component     Latest Ref Rng 10/20/2014 10/21/2014  Sodium     137 - 147 mEq/L 130 (L) 134 (L)   Treatment provided: IV NS 267ml bolus Monitoring BMP   Possible Clinical Conditions:  Hyponatremia Unable to determine Other  Thank you, Yumiko Alkins T. Pricilla Handler, MSN, MBA/MHA Clinical Documentation Specialist [email or web address] Office # [phone]

## 2014-10-21 NOTE — Progress Notes (Addendum)
[facility] PROGRESS NOTE  Carlotta Telfair BHA:[id number] DOB: 11-17-1961 DOA: 10/11/2014  PCP: Philis Fendt, MD  Brief HPI: Sabrina Adkins is a 53 y.o. 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. She presented with painful multiple subcutaneous nodules beneath her breasts, and the anterior and shoulder area and upper abdominal wall. CT chest angiogram revealed rapid progression of her lung cancer, withsubcutaneous nodules actually being metastatic disease, but having intra-abdominal metastasis, as well. She was admitted for chemotherapy. Patient then developed supraventricular tachycardia with heart rate being in the 170s. Seen by cardiology and started on Amiodarone. She was given chemotherapy with Gemzar and Carboplatin on 11/4. Plan is to repeat chemo 11/11.  Assessment and plan:   Metastatic Edgar She has new brain mets on brain MRI, per Dr Julien Nordmann radiation oncology saw patient and plan is to start with systemic chemotherapy first. CT abd/pelvis revealed intra-abdominal mets as well. Received Chemo with Gemzar and Carboplatin 11/4. S/P Gemzar 11-11.  Radiation to brain to be pursued at later date.  Discussed prognosis with Dr. Julien Nordmann. It is poor and life expectancy is likely 3-6 months at the most.  LE dopplers neg for DVT.   Nausea and Vomiting Continues to have vomiting. Was likely due to chemo. Will try Reglan before meals.  Abdomen is benign.  Improved.   Pedal Edema Improved. No more lasix for now.   SVT/PAF Cardiology following and was on Amiodarone infusion for many days. HR continues to spike once in a while. She was changed to oral Amiodarone 11/9 after discussing with Dr. Irish Lack.  On BB and Plavix as well. Plavix doesn't protect against CVA but considering her poor prgonosis, will not  pursue anticoagulation. Plus her brain mets make her very high risk.  UTI Completed course of levaquin. 11/1 Urine cx with 75,000 multiple morphotypes present, none predominant.   Hyponatremia; mild. Improving. Avoid IV fluids to avoid fluid retention.   Leukocytosis WBC fluctuates. Monitor for fever.  Repeat urine culture.  Follow trend.  Chest x ray 11-11 negative for PNA.  Trichomonas urine, will order flagyl. 1 gr BID for 2 doses. Screening for HIV.   Essential HTN Stable, controlled. Continue current regimen.  Diabetes mellitus Type 2 Poorly controlled but improved after adjustments made to lantus dosing. Continue Lantus and SSI. A1c was 9.7 in September. May need to make further adjustments as her oral intake improves. But CBG's are better.  DVT Prophylaxis: Heparin SQ    Code Status: Full Code  Family Communication: Discussed with patient. Disposition Plan: Daughter plans to take her to [place] this weekend. Daughter has found a PCP for her there and medical records are in the process of being sent over. Dr. Argentina Donovan. Office- [phone]. Fax- [phone]. Consultants: Oncology, Cardiology  Procedures:  2 D ECHO Study Conclusions - Left ventricle: The cavity size was normal. Wall thickness wasnormal. Systolic function was normal. The estimated ejectionfraction was in the range of 55% to 60%. Wall motion was normal;there were no regional wall motion abnormalities. Leftventricular diastolic function parameters were normal. - Pericardium, extracardiac: A trivial pericardial effusion wasidentified.  LE Venous Dopplers No DVT  Antibiotics: None  Subjective: Patient denies headaches, does relates right eye pain for months, some times has blurry vision.  denies chest pain. She is alert, following command.   Objective: Vital Signs  Filed Vitals:   10/20/14  1731 10/20/14 1929 10/20/14 2104 10/21/14 0623  BP: 87/59 105/54 100/58 102/53  Pulse: 77 76 81  92  Temp:   98.7 F (37.1 C) 98.9 F (37.2 C)  TempSrc:   Oral Oral  Resp:   18 18  Height:      Weight:      SpO2:  96% 97% 93%    Intake/Output Summary (Last 24 hours) at 10/21/14 1441 Last data filed at 10/21/14 0643  Gross per 24 hour  Intake    440 ml  Output      0 ml  Net    440 ml   Filed Weights   10/11/14 2029 10/12/14 0440 10/12/14 1722  Weight: 113.717 kg (250 lb 11.2 oz) 113.7 kg (250 lb 10.6 oz) 113 kg (249 lb 1.9 oz)    General appearance: alert, cooperative, appears stated age and no distress.  Resp: No definite crackles. No wheezing. Cardio: regular rate and rhythm, S1, S2 normal, no murmur, click, rub or gallop GI: soft, non-tender; bowel sounds normal; no masses,  no organomegaly Improved edema bilateral LE . Neurologic: no focal deficits  Lab Results:  Basic Metabolic Panel:  Recent Labs Lab 10/17/14 0459 10/18/14 0520 10/19/14 0540 10/20/14 0423 10/21/14 0505  NA 135* 131* 130* 130* 134*  K 4.4 4.5 4.2 4.2 4.4  CL 94* 92* 93* 91* 96  CO2 28 29 26 30 30   GLUCOSE 265* 198* 225* 120* 122*  BUN 21 21 16 17 12   CREATININE 0.76 0.84 0.77 0.91 0.86  CALCIUM 9.5 9.5 8.7 9.2 9.3   Liver Function Tests:  Recent Labs Lab 10/17/14 0459  AST 24  ALT 21  ALKPHOS 66  BILITOT 0.7  PROT 5.4*  ALBUMIN 2.3*   CBC:  Recent Labs Lab 10/17/14 0459 10/18/14 0520 10/19/14 0540 10/20/14 0423 10/21/14 0505  WBC 18.8* 17.0* 10.6* 13.5* 16.8*  HGB 11.8* 12.6 11.2* 11.5* 10.9*  HCT 35.3* 36.6 33.0* 34.5* 32.9*  MCV 69.5* 69.2* 69.0* 69.3* 69.6*  PLT 170 188 157 151 110*   CBG:  Recent Labs Lab 10/20/14 0700 10/20/14 1212 10/20/14 1706 10/20/14 2101 10/21/14 0835  GLUCAP 122* 132* 167* 150* 173*    Recent Results (from the past 240 hour(s))  MRSA PCR Screening     Status: None   Collection Time: 10/12/14 10:29 AM  Result Value Ref Range Status   MRSA by PCR NEGATIVE NEGATIVE Final    Comment:        The GeneXpert MRSA Assay  (FDA approved for NASAL specimens only), is one component of a comprehensive MRSA colonization surveillance program. It is not intended to diagnose MRSA infection nor to guide or monitor treatment for MRSA infections.       Studies/Results: Dg Chest 2 View  10/21/2014   CLINICAL DATA:  Shortness of breath, history of lung cancer  EXAM: CHEST  2 VIEW  COMPARISON:  11/5/ 15  FINDINGS: Cardiomediastinal silhouette is stable. Elevation of the right hemidiaphragm again noted. Stable nodular lesion right base. Again noted right base atelectasis or infiltrate. No pulmonary edema. Mild degenerative changes thoracic spine. Mild left basilar atelectasis.  IMPRESSION: Again noted nodular lesion right base. Elevation of the right hemidiaphragm. Stable right basilar atelectasis or infiltrate. No pulmonary edema.   Electronically Signed   By: Lahoma Crocker M.D.   On: 10/21/2014 14:17    Medications:  Scheduled: . sodium chloride   Intravenous Once  . amiodarone  400 mg Oral Daily  . clopidogrel  75 mg Oral Daily  . docusate sodium  100 mg Oral BID  . gabapentin  100 mg Oral TID  . heparin  5,000 Units Subcutaneous 3 times per day  . insulin aspart  0-20 Units Subcutaneous TID WC  . insulin aspart  0-5 Units Subcutaneous QHS  . insulin aspart  3 Units Subcutaneous TID WC  . insulin glargine  18 Units Subcutaneous Daily  . insulin glargine  35 Units Subcutaneous QHS  . metoCLOPramide  5 mg Oral TID AC  . metoprolol tartrate  25 mg Oral QID  . multivitamin with minerals  1 tablet Oral Daily  . QUEtiapine  50 mg Oral QHS  . senna  1 tablet Oral BID   Continuous: . sodium chloride 10 mL/hr at 10/16/14 2116   KWI:OXBDZHGDJMEQA **OR** acetaminophen, albuterol, ALPRAZolam, alteplase, alum & mag hydroxide-simeth, bisacodyl, Cold Pack, heparin lock flush, heparin lock flush, morphine injection, ondansetron **OR** ondansetron (ZOFRAN) IV, oxyCODONE, polyethylene glycol, promethazine, sodium chloride,  sodium chloride, sodium chloride, sodium chloride, sodium chloride  Assessment/Plan:  Principal Problem:   Metastatic lung cancer (metastasis from lung to other site) Active Problems:   DM (diabetes mellitus)   HTN (hypertension)   UTI (lower urinary tract infection)   Supraventricular tachycardia   PAF (paroxysmal atrial fibrillation)   Ventricular tachycardia   Metastatic disease   Paroxysmal atrial fibrillation   Type 2 diabetes mellitus with complication   Nausea and vomiting   Pedal edema      LOS: 10 days   Niel Hummer A  [facility] Pager [phone] 10/21/2014, 2:41 PM  If 8PM-8AM, please contact night-coverage at [email or web address], password [facility]

## 2014-10-22 LAB — URINE CULTURE
COLONY COUNT: NO GROWTH
Culture: NO GROWTH

## 2014-10-22 LAB — CBC
HCT: 32.9 % — ABNORMAL LOW (ref 36.0–46.0)
Hemoglobin: 11.2 g/dL — ABNORMAL LOW (ref 12.0–15.0)
MCH: 23.9 pg — AB (ref 26.0–34.0)
MCHC: 34 g/dL (ref 30.0–36.0)
MCV: 70.1 fL — ABNORMAL LOW (ref 78.0–100.0)
PLATELETS: 90 10*3/uL — AB (ref 150–400)
RBC: 4.69 MIL/uL (ref 3.87–5.11)
RDW: 13.8 % (ref 11.5–15.5)
WBC: 18.5 10*3/uL — ABNORMAL HIGH (ref 4.0–10.5)

## 2014-10-22 LAB — BASIC METABOLIC PANEL
ANION GAP: 10 (ref 5–15)
BUN: 14 mg/dL (ref 6–23)
CALCIUM: 9.2 mg/dL (ref 8.4–10.5)
CO2: 27 mEq/L (ref 19–32)
CREATININE: 0.88 mg/dL (ref 0.50–1.10)
Chloride: 93 mEq/L — ABNORMAL LOW (ref 96–112)
GFR calc non Af Amer: 75 mL/min — ABNORMAL LOW (ref >90)
GFR, EST AFRICAN AMERICAN: 87 mL/min — AB (ref >90)
Glucose, Bld: 145 mg/dL — ABNORMAL HIGH (ref 70–99)
Potassium: 4.7 mEq/L (ref 3.7–5.3)
Sodium: 130 mEq/L — ABNORMAL LOW (ref 137–147)

## 2014-10-22 LAB — GLUCOSE, CAPILLARY
GLUCOSE-CAPILLARY: 149 mg/dL — AB (ref 70–99)
GLUCOSE-CAPILLARY: 187 mg/dL — AB (ref 70–99)
GLUCOSE-CAPILLARY: 203 mg/dL — AB (ref 70–99)
Glucose-Capillary: 207 mg/dL — ABNORMAL HIGH (ref 70–99)

## 2014-10-22 MED ORDER — METOPROLOL TARTRATE 1 MG/ML IV SOLN
5.0000 mg | Freq: Once | INTRAVENOUS | Status: AC
  Administered 2014-10-22: 5 mg via INTRAVENOUS
  Filled 2014-10-22: qty 5

## 2014-10-22 MED ORDER — STERILE WATER FOR INJECTION IJ SOLN
INTRAMUSCULAR | Status: AC
  Administered 2014-10-22: 11:00:00
  Filled 2014-10-22: qty 10

## 2014-10-22 MED ORDER — SODIUM CHLORIDE 0.9 % IV BOLUS (SEPSIS)
500.0000 mL | Freq: Once | INTRAVENOUS | Status: AC
  Administered 2014-10-22: 500 mL via INTRAVENOUS

## 2014-10-22 MED ORDER — ALTEPLASE 2 MG IJ SOLR
2.0000 mg | Freq: Once | INTRAMUSCULAR | Status: AC
  Administered 2014-10-22: 2 mg
  Filled 2014-10-22: qty 2

## 2014-10-22 MED ORDER — PANTOPRAZOLE SODIUM 40 MG PO TBEC
40.0000 mg | DELAYED_RELEASE_TABLET | Freq: Every day | ORAL | Status: DC
  Administered 2014-10-22 – 2014-10-26 (×5): 40 mg via ORAL
  Filled 2014-10-22 (×5): qty 1

## 2014-10-22 MED ORDER — LEVOFLOXACIN IN D5W 500 MG/100ML IV SOLN
500.0000 mg | INTRAVENOUS | Status: DC
  Administered 2014-10-22: 500 mg via INTRAVENOUS
  Filled 2014-10-22 (×2): qty 100

## 2014-10-22 NOTE — Progress Notes (Signed)
Patient converted to afib after using BSC. EKG done confirmed Afib with RVR. N.P. Notified and she ordered Metoprolol IV 5mg . Will continue to monitor.

## 2014-10-22 NOTE — Progress Notes (Signed)
SUBJECTIVE:  Palpitations are less frequent.  Afib last night- reverted to NSR.  Still with nausea.  OBJECTIVE:   Vitals:   Filed Vitals:   10/21/14 2207 10/22/14 0504 10/22/14 0743 10/22/14 0958  BP: 110/75 127/83 104/54 116/66  Pulse: 75 130 93 88  Temp: 98.2 F (36.8 C) 98.2 F (36.8 C)    TempSrc: Oral Oral    Resp: 18 20    Height:      Weight:      SpO2: 98% 99%     I&O's:    Intake/Output Summary (Last 24 hours) at 10/22/14 1357 Last data filed at 10/22/14 1032  Gross per 24 hour  Intake 642.83 ml  Output    300 ml  Net 342.83 ml   TELEMETRY: Reviewed telemetry pt in NSR, PAF:     PHYSICAL EXAM General: Well developed, well nourished, in no acute distress Head:   Normal cephalic and atramatic  Lungs:  No wheeze Heart:   HRRR    Abdomen: obese  Neuro: Alert and oriented. Psych:  Normal affect, responds appropriately Skin: No rash   LABS: Basic Metabolic Panel:  Recent Labs  10/21/14 0505 10/22/14 0505  NA 134* 130*  K 4.4 4.7  CL 96 93*  CO2 30 27  GLUCOSE 122* 145*  BUN 12 14  CREATININE 0.86 0.88  CALCIUM 9.3 9.2   Liver Function Tests: No results for input(s): AST, ALT, ALKPHOS, BILITOT, PROT, ALBUMIN in the last 72 hours. No results for input(s): LIPASE, AMYLASE in the last 72 hours. CBC:  Recent Labs  10/21/14 0505 10/22/14 0505  WBC 16.8* 18.5*  HGB 10.9* 11.2*  HCT 32.9* 32.9*  MCV 69.6* 70.1*  PLT 110* 90*   Cardiac Enzymes: No results for input(s): CKTOTAL, CKMB, CKMBINDEX, TROPONINI in the last 72 hours. BNP: Invalid input(s): POCBNP D-Dimer: No results for input(s): DDIMER in the last 72 hours. Hemoglobin A1C: No results for input(s): HGBA1C in the last 72 hours. Fasting Lipid Panel: No results for input(s): CHOL, HDL, LDLCALC, TRIG, CHOLHDL, LDLDIRECT in the last 72 hours. Thyroid Function Tests: No results for input(s): TSH, T4TOTAL, T3FREE, THYROIDAB in the last 72 hours.  Invalid input(s): FREET3 Anemia  Panel: No results for input(s): VITAMINB12, FOLATE, FERRITIN, TIBC, IRON, RETICCTPCT in the last 72 hours. Coag Panel:   Lab Results  Component Value Date   INR 1.08 09/02/2014   INR 2.40* 08/25/2014   PROTIME * 09/02/2014    RADIOLOGY: Ct Abdomen Pelvis Wo Contrast  10/13/2014   CLINICAL DATA:  Newly diagnosed metastatic lung cancer. No abdominal plate.  EXAM: CT ABDOMEN AND PELVIS WITHOUT CONTRAST  TECHNIQUE: Multidetector CT imaging of the abdomen and pelvis was performed following the standard protocol without IV contrast.  COMPARISON:  Chest CT 10/11/2014  FINDINGS: Right middle lobe mass at the right lung base again noted, measuring up to 3.8 cm compared with 3.6 cm. The slight change likely related to differences in scan planes. Other smaller bilateral lower lobe nodules are noted as measured on recent chest CT. Small right pleural effusion. Atelectasis in the right lower lobe and right middle lobe at the right lung base. Heart is normal size.  Numerous subcutaneous and chest wall nodules noted, the largest in the left lower chest wall measuring up to 2.4 cm, stable.  3.2 cm right adrenal mass. Left adrenal mass measures up to 4.1 cm. These slight differences likely reflect slight differences in scan planes. No definite focal hepatic abnormality on this  unenhanced study. Spleen, pancreas have an unremarkable unenhanced appearance.  Innumerable retroperitoneal nodules and masses. Index left retroperitoneal nodule on image 45 measures 2.4 cm. Numerous other small retroperitoneal/perinephric nodules bilaterally. Numerous subcutaneous nodules throughout the subcutaneous soft tissues of the abdomen and pelvis. Index right lateral subcutaneous nodule measures 1.8 cm on image 51. Index left anterior pelvic subcutaneous nodule measures 2.4 cm on image 68. Subcutaneous nodules within the inguinal regions bilaterally. Large right anterior subcutaneous nodule superiorly in the abdomen over the in the liver  measures 2.8 cm on image 27.  Uterus and adnexa have an unremarkable unenhanced appearance. Urinary bladder unremarkable. Moderate stool burden throughout the colon. Small bowel is decompressed. Aorta is normal caliber.  No acute bony abnormality or focal bone lesion.  IMPRESSION: Extensive metastatic disease including in the low lower lung zones bilaterally, bilateral adrenal glands, as well as numerous retroperitoneal and subcutaneous nodules.  Study is limited in evaluation of the solid organs without intravenous contrast.  Dominant mass in the right middle lobe at the right lung base.  Bibasilar atelectasis.   Electronically Signed   By: Rolm Baptise M.D.   On: 10/13/2014 16:49   Dg Chest 2 View  10/21/2014   CLINICAL DATA:  Shortness of breath, history of lung cancer  EXAM: CHEST  2 VIEW  COMPARISON:  11/5/ 15  FINDINGS: Cardiomediastinal silhouette is stable. Elevation of the right hemidiaphragm again noted. Stable nodular lesion right base. Again noted right base atelectasis or infiltrate. No pulmonary edema. Mild degenerative changes thoracic spine. Mild left basilar atelectasis.  IMPRESSION: Again noted nodular lesion right base. Elevation of the right hemidiaphragm. Stable right basilar atelectasis or infiltrate. No pulmonary edema.   Electronically Signed   By: Lahoma Crocker M.D.   On: 10/21/2014 14:17   Dg Chest 2 View  10/11/2014   CLINICAL DATA:  Chest pain  EXAM: CHEST  2 VIEW  COMPARISON:  CT from earlier in the same day  FINDINGS: Cardiac show remains elevated with right basilar atelectasis. A dominant right pulmonary nodule is noted as well as some smaller nodules similar to that seen on the prior CT examination. No sizable effusion is noted. No bony abnormality is seen.  IMPRESSION: Right basilar atelectasis with evidence of multiple pulmonary nodules.   Electronically Signed   By: Inez Catalina M.D.   On: 10/11/2014 14:45   Ct Angio Chest Pe W/cm &/or Wo Cm  10/11/2014   CLINICAL DATA:   Relatively recent diagnosis of non-small-cell lung carcinoma with dominant right middle lobe mass consistent with squamous cell carcinoma. Now with shortness of breath. History of DVT.  EXAM: CT ANGIOGRAPHY CHEST WITH CONTRAST  TECHNIQUE: Multidetector CT imaging of the chest was performed using the standard protocol during bolus administration of intravenous contrast. Multiplanar CT image reconstructions and MIPs were obtained to evaluate the vascular anatomy.  CONTRAST:  33mL OMNIPAQUE IOHEXOL 350 MG/ML SOLN  COMPARISON:  CT of the chest without contrast on 08/12/2014  FINDINGS: There has been significant progression of metastatic lung carcinoma in the chest and visualized upper abdomen. The dominant right middle lobe mass now measures approximately 2.9 x 3.6 cm compared to approximately 2.0 x 2.2 cm on the prior study. A number of metastatic pulmonary nodules are identified in both lungs. Anterior right upper lobe nodule measures 1.2 cm. Superior segment right lower lobe nodule measures 0.5 cm. Nodule abutting the lower major fissure measures 1.3 cm. Right lower lobe peripheral nodule measures 0.7 cm. Several other smaller right  lower lobe nodules present. There are at least 6 new pulmonary nodules in the left lower lobe with the largest measuring 1.1 cm. Stable 1.6 cm ground-glass opacity at the right lung apex.  Metastatic adenopathy in the mediastinum also shows significant progression since the prior CT. The dominant anterior mediastinal lymph node mass abutting both the ascending thoracic aorta and SVC now measures approximately 3.2 x 4.7 cm as compared to roughly 2.5 cm in short axis on the previous exam. Largest right peritracheal lymph node measures 1.6 cm in short axis. Subcarinal lymph node mass measures 2.6 x 3.5 cm. Prevascular lymph node mass anterior to the aortic arch shows enlargement and measures 1.5 cm in short axis. Bilateral axillary lymph nodes also show enlargement with the largest left  axillary lymph node measuring 2.3 cm in short axis and the largest right axillary lymph node measuring 1.6 cm in short axis.  Multiple metastatic subcutaneous nodules are identified and are completely new since the prior examination. The largest subcutaneous nodule is in the right anterior lower costal subcutaneous fat and measures 2.8 cm in diameter. This was not present previously. A number of other smaller subcutaneous nodules are present in the left anterior upper abdominal wall fat, fat abutting the chest wall laterally on the left and the subcutaneous fat overlying the left scapula.  The visualized upper abdomen also shows evidence of bilateral adrenal metastasis which were visualized previously. The dominant right adrenal metastasis now measures approximately 3.4 x 3.6 cm compared to 2.7 cm in diameter on the prior study. One or 2 separate left adrenal metastases measure in conglomerate dimensions approximately 2.5 x 3.8 cm. New 1.2 cm nodule posterior to the left upper kidney in the perinephric fat is consistent with metastasis.  No evidence of pulmonary embolism. The thoracic aorta is normal in caliber and shows no evidence of dissection. Diffuse spondylosis of the thoracic spine present. There are no visualized bone metastases in the chest. No pleural effusions or pulmonary infiltrates. There is a small amount of pericardial fluid which appears more prominent compared to the prior CT.  Review of the MIP images confirms the above findings.  IMPRESSION: Marked progression of metastatic disease in the chest and upper abdomen, as detailed above. There is enlargement of the dominant right middle lobe lung carcinoma an a number of new metastatic nodules in both lungs. Metastatic adenopathy in the mediastinum and both axillary regions has increased in size since the prior CT. 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 There are a number of new subcutaneous metastatic nodules in the subcutaneous fat. New  posterior left perinephric metastatic nodules also visualized. The patient presumably has not received any interval treatment since the prior CT. Recommend oncologic consultation. A formal PET scan would also be helpful to complete staging evaluation prior to initiation of any cancer treatment. There is no evidence of pulmonary embolism.  Findings were communicated directly to Dr. Canary Brim in the Emergency Department.   Electronically Signed   By: Aletta Edouard M.D.   On: 10/11/2014 14:30   Mr Jeri Cos DX Contrast  10/11/2014   CLINICAL DATA:  Initial encounter. New diagnosis lung cancer. Right-sided headache, 3 days duration. Staging.  EXAM: MRI HEAD WITHOUT AND WITH CONTRAST  TECHNIQUE: Multiplanar, multiecho pulse sequences of the brain and surrounding structures were obtained without and with intravenous contrast.  CONTRAST:  92mL MULTIHANCE GADOBENATE DIMEGLUMINE 529 MG/ML IV SOLN  COMPARISON:  None.  FINDINGS: There are 3 metastatic lesions noted affecting the brain. Within  the right lateral cerebellum, there is a 7 mm metastasis. This contains some petechial blood products. At the right parietal vertex, there is a 9 mm metastasis. This contains minimal petechial blood products. In the left posterior parietal lobe, there is a 5-6 mm metastasis.  No evidence of ischemic infarction. No evidence of chronic small vessel disease. No hydrocephalus. No extra-axial fluid collection. No calvarial lesion is seen.  In the right orbit, there is a well-circumscribed abnormality measuring approximately 1 cm in size. This probably represents a venous varix. I cannot be certain this does not represent a metastasis to the extra-ocular muscles (superior rectus).  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 Right cerebellum. Right parietal vertex. Left posterior parietal region. Early petechial blood products in the right cerebellar and right parietal lesions.  Orbital lesion on the right  associated with the superior rectus muscle. This could represent a venous varix or metastasis. This could be studied with CT or dedicated orbital MRI should differentiation be important.   Electronically Signed   By: Nelson Chimes M.D.   On: 10/11/2014 19:36   Dg Chest Port 1 View  10/15/2014   CLINICAL DATA:  Dyspnea.  EXAM: PORTABLE CHEST - 1 VIEW  COMPARISON:  10/11/2014  FINDINGS: The cardiac silhouette remains enlarged. There is persistent elevation of right hemidiaphragm, not significantly changed. Right-sided lung nodules are again seen, measuring approximately 3.9 cm in the right base and 1.3 cm adjacent to the right hilum. Focal linear opacity in the lateral left mid lung is unchanged, likely platelike atelectasis. Right basilar atelectasis is again seen. No large pleural effusion is identified. No pneumothorax.  IMPRESSION: Stable appearance of the chest with right lung nodules/mass and bibasilar atelectasis.   Electronically Signed   By: Logan Bores   On: 10/15/2014 09:31   Dg Chest Port 1 View  09/23/2014   CLINICAL DATA:  Postop bronchoscopy.  EXAM: PORTABLE CHEST - 1 VIEW  COMPARISON:  08/12/2014  FINDINGS: Low lung volumes. Right lower lobe airspace opacity could be related to bronchoscopy or represent atelectasis. No pneumothorax. Previously seen mid right lung nodule not well visualized on today's study, likely due to the low volumes and airspace disease. Minimal left base atelectasis. Heart is borderline in size.  IMPRESSION: Low lung volumes with bibasilar opacities, likely atelectasis, right greater than left. No pneumothorax.   Electronically Signed   By: Rolm Baptise M.D.   On: 09/23/2014 13:10   Dg C-arm Bronchoscopy  09/23/2014   CLINICAL DATA: right middle lobe lung mass   C-ARM BRONCHOSCOPY  Fluoroscopy was utilized by the requesting physician.  No radiographic  interpretation.       ASSESSMENT: Kathyrn Lass:   AFib: Now in NSR.  Switched to PO Amio. Continue this as vomiting is  decreasing in frequency.  Continue amiodarone load.  Continue metoprolol.  Would not add any other meds in this patient for rate control.  As Amio loads, AFib episodes should decrease.     On Plavix, but this is not adequate for stroke prevention. Not a candidate for further anticoagulation.    Jettie Booze., MD  10/22/2014  1:57 PM

## 2014-10-22 NOTE — Progress Notes (Signed)
Patient's BP 88/44 at 1745 and patient complaining of dizziness, Dr Tyrell Antonio made aware. 500 cc bolus ordered, BP up to 111/64 after completion of bolus. Pt still slightly dizzy. Reminded not to get out of bed by herself, call bell within reach. Will continue to monitor patient.

## 2014-10-22 NOTE — Plan of Care (Signed)
Problem: Phase I Progression Outcomes Goal: If Ablation, see post EP orders Outcome: Not Applicable Date Met:  62/83/66 Goal: Other Phase I Outcomes/Goals Outcome: Progressing  Problem: Phase II Progression Outcomes Goal: Other Phase II Outcomes/Goals Outcome: Progressing  Problem: Phase III Progression Outcomes Goal: Anticoagulation Therapy per MD order Outcome: Completed/Met Date Met:  10/22/14 Goal: Discharge plan remains appropriate-arrangements made Outcome: Progressing

## 2014-10-22 NOTE — Progress Notes (Signed)
OT Cancellation Note  Patient Details Name: Isabellamarie Randa MRN: [id number] DOB: [birth year]   Cancelled Treatment:    Reason Eval/Treat Not Completed: Other (comment) Pt with nausea. Nursing made aware. Pt declines getting up right now. Will try back another time.  Jules Schick  [phone] 10/22/2014, 11:33 AM

## 2014-10-22 NOTE — Progress Notes (Addendum)
RN paged secondary to pt converting to AFib with RVR in the 130s. Pt was up to BR at the time. No chest pain or palpitations. Nausea, but no vomiting. Has kept down her amiodarone and metoprolol po. Hx of PAF since in hospital and was on Amiodarone IV for several days. Cardiology is following. We will give 5mg  metoprolol once and observe for awhile. Hopefully, she will convert back to sinus or at least become rate controlled. Will follow. Clance Boll, NP [facility] Update: BP low after IV Metoprolol. HR still in the 120s range. Asked RN to take manual BP in 45 min and call to MD. Get cardio input on Afib this am.  Baltazar Najjar, NP Update: Pt converted back to NSR, HR in the 80s.  Baltazar Najjar, NP

## 2014-10-22 NOTE — Progress Notes (Signed)
[facility] PROGRESS NOTE  Sabrina Adkins [id number] DOB: May 14, 1961 DOA: 10/11/2014  PCP: Philis Fendt, MD  Brief HPI: Sabrina Adkins is a 53 y.o. 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. She presented with painful multiple subcutaneous nodules beneath her breasts, and the anterior and shoulder area and upper abdominal wall. CT chest angiogram revealed rapid progression of her lung cancer, withsubcutaneous nodules actually being metastatic disease, but having intra-abdominal metastasis, as well. She was admitted for chemotherapy. Patient then developed supraventricular tachycardia with heart rate being in the 170s. Seen by cardiology and started on Amiodarone. She was given chemotherapy with Gemzar and Carboplatin on 11/4. Post chemo 11/11.  Assessment and plan:   Metastatic [place] She has new brain mets on brain MRI, per Dr Julien Nordmann radiation oncology saw patient and plan is to start with systemic chemotherapy first. CT abd/pelvis revealed intra-abdominal mets as well. Received Chemo with Gemzar and Carboplatin 11/4. S/P Gemzar 11-11.  Radiation to brain to be pursued at later date. Patient will be moving to [place] after discharge from hospital.  Discussed prognosis with Dr. Julien Nordmann. It is poor and life expectancy is likely 3-6 months at the most.  LE dopplers neg for DVT.   Nausea and Vomiting, abdominal pain.  Continues to have vomiting. Was likely due to chemo. Will try Reglan before meals.   Add Protonix. Bowel regimen.   Pedal Edema Improved. No more lasix for now.   SVT/PAF Cardiology following and was on Amiodarone infusion for many days. HR continues to spike once in a while. She was changed to oral Amiodarone 11/9 after discussing with Dr. Irish Lack.  On BB and Plavix as well. Plavix  doesn't protect against CVA but considering her poor prgonosis, will not pursue anticoagulation. Plus her brain mets make her very high risk. Now Hr controlled. Had and episode of A fib RVR early this morning.   UTI Completed course of levaquin. 11/1 Urine cx with 75,000 multiple morphotypes present, none predominant.   Hyponatremia; Mild.   Leukocytosis; follow repeated Urine culture.  Start levaquin, will follow urine culture.  WBC fluctuates. Monitor for fever.  Repeat urine culture.  Follow trend.  Chest x ray 11-11 negative for PNA.  Trichomonas urine, treated with flagyl. 1 gr BID for 2 doses. HIV negative.   Essential HTN Stable, controlled. Continue current regimen.  Diabetes mellitus Type 2 Change l;antus to once a day, due to vomiting to avoid hypoglycemia.   DVT Prophylaxis: Heparin SQ    Code Status: Full Code  Family Communication: Discussed with patient. Disposition Plan: plan to discharge in 2 days if stable.   Daughter plans to take her to [place] this weekend. Daughter has found a PCP for her there and medical records are in the process of being sent over. Dr. Argentina Donovan. Office- [phone]. Fax- [phone]. Consultants: Oncology, Cardiology  Procedures:  2 D ECHO Study Conclusions - Left ventricle: The cavity size was normal. Wall thickness wasnormal. Systolic function was normal. The estimated ejectionfraction was in the range of 55% to 60%. Wall motion was normal;there were no regional wall motion abnormalities. Leftventricular diastolic function parameters were normal. - Pericardium, extracardiac: A trivial pericardial effusion wasidentified.  LE Venous Dopplers No DVT  Antibiotics: None  Subjective: Relates right eye pain is not worse. Had some abdominal pain this morning and vomiting .  Objective: Vital  Signs  Filed Vitals:   10/21/14 2207 10/22/14 0504 10/22/14 0743 10/22/14 0958  BP: 110/75 127/83 104/54 116/66  Pulse: 75  130 93 88  Temp: 98.2 F (36.8 C) 98.2 F (36.8 C)    TempSrc: Oral Oral    Resp: 18 20    Height:      Weight:      SpO2: 98% 99%      Intake/Output Summary (Last 24 hours) at 10/22/14 1327 Last data filed at 10/22/14 1032  Gross per 24 hour  Intake 642.83 ml  Output    300 ml  Net 342.83 ml   Filed Weights   10/11/14 2029 10/12/14 0440 10/12/14 1722  Weight: 113.717 kg (250 lb 11.2 oz) 113.7 kg (250 lb 10.6 oz) 113 kg (249 lb 1.9 oz)    General appearance: alert, cooperative, appears stated age and no distress.  Resp: No definite crackles. No wheezing. Cardio: regular rate and rhythm, S1, S2 normal, no murmur, click, rub or gallop GI: soft, non-tender; bowel sounds normal; no masses,  no organomegaly Improved edema bilateral LE . Neurologic: no focal deficits  Lab Results:  Basic Metabolic Panel:  Recent Labs Lab 10/18/14 0520 10/19/14 0540 10/20/14 0423 10/21/14 0505 10/22/14 0505  NA 131* 130* 130* 134* 130*  K 4.5 4.2 4.2 4.4 4.7  CL 92* 93* 91* 96 93*  CO2 29 26 30 30 27   GLUCOSE 198* 225* 120* 122* 145*  BUN 21 16 17 12 14   CREATININE 0.84 0.77 0.91 0.86 0.88  CALCIUM 9.5 8.7 9.2 9.3 9.2   Liver Function Tests:  Recent Labs Lab 10/17/14 0459  AST 24  ALT 21  ALKPHOS 66  BILITOT 0.7  PROT 5.4*  ALBUMIN 2.3*   CBC:  Recent Labs Lab 10/18/14 0520 10/19/14 0540 10/20/14 0423 10/21/14 0505 10/22/14 0505  WBC 17.0* 10.6* 13.5* 16.8* 18.5*  HGB 12.6 11.2* 11.5* 10.9* 11.2*  HCT 36.6 33.0* 34.5* 32.9* 32.9*  MCV 69.2* 69.0* 69.3* 69.6* 70.1*  PLT 188 157 151 110* 90*   CBG:  Recent Labs Lab 10/21/14 0835 10/21/14 1753 10/21/14 2228 10/22/14 0717 10/22/14 1132  GLUCAP 173* 129* 167* 149* 187*    No results found for this or any previous visit (from the past 240 hour(s)).    Studies/Results: Dg Chest 2 View  10/21/2014   CLINICAL DATA:  Shortness of breath, history of lung cancer  EXAM: CHEST  2 VIEW  COMPARISON:  11/5/ 15   FINDINGS: Cardiomediastinal silhouette is stable. Elevation of the right hemidiaphragm again noted. Stable nodular lesion right base. Again noted right base atelectasis or infiltrate. No pulmonary edema. Mild degenerative changes thoracic spine. Mild left basilar atelectasis.  IMPRESSION: Again noted nodular lesion right base. Elevation of the right hemidiaphragm. Stable right basilar atelectasis or infiltrate. No pulmonary edema.   Electronically Signed   By: Lahoma Crocker M.D.   On: 10/21/2014 14:17    Medications:  Scheduled: . sodium chloride   Intravenous Once  . amiodarone  400 mg Oral Daily  . clopidogrel  75 mg Oral Daily  . docusate sodium  100 mg Oral BID  . gabapentin  100 mg Oral TID  . heparin  5,000 Units Subcutaneous 3 times per day  . insulin aspart  0-20 Units Subcutaneous TID WC  . insulin aspart  0-5 Units Subcutaneous QHS  . insulin aspart  3 Units Subcutaneous TID WC  . insulin glargine  18 Units Subcutaneous Daily  . insulin glargine  35 Units Subcutaneous QHS  . metoCLOPramide  5 mg Oral TID AC  . metoprolol tartrate  25 mg Oral QID  . multivitamin with minerals  1 tablet Oral Daily  . QUEtiapine  50 mg Oral QHS  . senna  1 tablet Oral BID   Continuous: . sodium chloride 10 mL/hr at 10/16/14 2116   SWF:[id number] **OR** acetaminophen, albuterol, ALPRAZolam, alum & mag hydroxide-simeth, bisacodyl, morphine injection, ondansetron **OR** ondansetron (ZOFRAN) IV, oxyCODONE, polyethylene glycol, promethazine, sodium chloride, sodium chloride, sodium chloride, sodium chloride, sodium chloride  Assessment/Plan:  Principal Problem:   Metastatic lung cancer (metastasis from lung to other site) Active Problems:   DM (diabetes mellitus)   HTN (hypertension)   UTI (lower urinary tract infection)   Supraventricular tachycardia   PAF (paroxysmal atrial fibrillation)   Ventricular tachycardia   Metastatic disease   Paroxysmal atrial fibrillation   Type 2 diabetes  mellitus with complication   Nausea and vomiting   Pedal edema      LOS: 11 days   Niel Hummer A  [facility] Pager [phone] 10/22/2014, 1:27 PM  If 8PM-8AM, please contact night-coverage at [email or web address], password [facility]

## 2014-10-22 NOTE — Progress Notes (Signed)
PT Cancellation Note  Patient Details Name: Sabrina Adkins MRN: [id number] DOB: Jul 26, 1961   Cancelled Treatment:    Reason Eval/Treat Not Completed: Attempted PT tx session-pt declined to participate with therapy-stated " I just got that sedative." Will check back another day.    Weston Anna, MPT Pager: [phone]

## 2014-10-22 NOTE — Telephone Encounter (Signed)
Patient still in hospital, has not been released yet.  Awaiting patient's discharge.

## 2014-10-22 NOTE — Progress Notes (Signed)
Patient still on Afib 120's-130's.  Bp 89/51 N.P. Informed. No new orders. Will continue to monitor.

## 2014-10-23 ENCOUNTER — Inpatient Hospital Stay (HOSPITAL_COMMUNITY): Payer: Medicaid Other

## 2014-10-23 ENCOUNTER — Encounter (HOSPITAL_COMMUNITY): Payer: Self-pay | Admitting: Interventional Cardiology

## 2014-10-23 ENCOUNTER — Other Ambulatory Visit: Payer: Medicaid Other

## 2014-10-23 ENCOUNTER — Ambulatory Visit: Payer: Medicaid Other | Admitting: Radiation Oncology

## 2014-10-23 DIAGNOSIS — C3491 Malignant neoplasm of unspecified part of right bronchus or lung: Secondary | ICD-10-CM

## 2014-10-23 DIAGNOSIS — C3490 Malignant neoplasm of unspecified part of unspecified bronchus or lung: Secondary | ICD-10-CM

## 2014-10-23 DIAGNOSIS — I950 Idiopathic hypotension: Secondary | ICD-10-CM

## 2014-10-23 DIAGNOSIS — C799 Secondary malignant neoplasm of unspecified site: Secondary | ICD-10-CM

## 2014-10-23 DIAGNOSIS — J189 Pneumonia, unspecified organism: Secondary | ICD-10-CM

## 2014-10-23 DIAGNOSIS — I480 Paroxysmal atrial fibrillation: Secondary | ICD-10-CM

## 2014-10-23 DIAGNOSIS — I10 Essential (primary) hypertension: Secondary | ICD-10-CM

## 2014-10-23 DIAGNOSIS — I959 Hypotension, unspecified: Secondary | ICD-10-CM | POA: Diagnosis present

## 2014-10-23 LAB — GLUCOSE, CAPILLARY
GLUCOSE-CAPILLARY: 227 mg/dL — AB (ref 70–99)
Glucose-Capillary: 165 mg/dL — ABNORMAL HIGH (ref 70–99)
Glucose-Capillary: 215 mg/dL — ABNORMAL HIGH (ref 70–99)
Glucose-Capillary: 195 mg/dL — ABNORMAL HIGH (ref 70–99)

## 2014-10-23 LAB — CBC
HCT: 31.7 % — ABNORMAL LOW (ref 36.0–46.0)
Hemoglobin: 10.4 g/dL — ABNORMAL LOW (ref 12.0–15.0)
MCH: 23.4 pg — ABNORMAL LOW (ref 26.0–34.0)
MCHC: 32.8 g/dL (ref 30.0–36.0)
MCV: 71.4 fL — AB (ref 78.0–100.0)
PLATELETS: 80 10*3/uL — AB (ref 150–400)
RBC: 4.44 MIL/uL (ref 3.87–5.11)
RDW: 13.7 % (ref 11.5–15.5)
WBC: 20.7 10*3/uL — AB (ref 4.0–10.5)

## 2014-10-23 MED ORDER — DEXTROSE 5 % IV SOLN
2.0000 g | Freq: Three times a day (TID) | INTRAVENOUS | Status: DC
  Administered 2014-10-23 – 2014-10-25 (×7): 2 g via INTRAVENOUS
  Filled 2014-10-23 (×8): qty 2

## 2014-10-23 MED ORDER — VANCOMYCIN HCL IN DEXTROSE 1-5 GM/200ML-% IV SOLN
1000.0000 mg | Freq: Two times a day (BID) | INTRAVENOUS | Status: DC
  Administered 2014-10-23 – 2014-10-25 (×4): 1000 mg via INTRAVENOUS
  Filled 2014-10-23 (×5): qty 200

## 2014-10-23 MED ORDER — METOPROLOL TARTRATE 25 MG PO TABS
25.0000 mg | ORAL_TABLET | Freq: Two times a day (BID) | ORAL | Status: DC
  Administered 2014-10-23 – 2014-10-26 (×7): 25 mg via ORAL
  Filled 2014-10-23 (×7): qty 1

## 2014-10-23 MED ORDER — BISACODYL 10 MG RE SUPP
10.0000 mg | Freq: Once | RECTAL | Status: AC
  Administered 2014-10-23: 10 mg via RECTAL
  Filled 2014-10-23: qty 1

## 2014-10-23 MED ORDER — METOPROLOL TARTRATE 1 MG/ML IV SOLN
2.5000 mg | Freq: Once | INTRAVENOUS | Status: AC
  Administered 2014-10-23: 2.5 mg via INTRAVENOUS
  Filled 2014-10-23: qty 5

## 2014-10-23 NOTE — Unmapped (Addendum)
Paperwork received

## 2014-10-23 NOTE — Progress Notes (Signed)
Called by nursing staff. Pt back in atrial fib with RVR, rate 120-125 bpm.  Pt asymptomatic NO complaints.  Agree with giving metoprolol 2.5 mg IV x 1 now Continue po amiodarone and metoprolol.  It looks like she is having paroxysms of atrial fib every 24-36 hours.  No other recs at this time.   MCALHANY,CHRISTOPHER 10/23/2014 3:16 PM

## 2014-10-23 NOTE — Progress Notes (Addendum)
SUBJECTIVE:  Palpitations are less frequent. No Afib last night, but she did have intermittent hypotension.  Still with nausea.  OBJECTIVE:   Vitals:   Filed Vitals:   10/22/14 2145 10/23/14 0642 10/23/14 1416 10/23/14 1432  BP: 122/65 122/78 94/71 92/62   Pulse: 76 65 120 128  Temp: 98.4 F (36.9 C) 98.3 F (36.8 C)  98.7 F (37.1 C)  TempSrc: Oral Oral  Oral  Resp: 18 18 18 20   Height:      Weight:      SpO2: 98% 98%  97%   I&O's:    Intake/Output Summary (Last 24 hours) at 10/23/14 1611 Last data filed at 10/23/14 1130  Gross per 24 hour  Intake    265 ml  Output    201 ml  Net     64 ml   TELEMETRY: Reviewed telemetry pt in NSR, PAF:     PHYSICAL EXAM General: Well developed, well nourished, in no acute distress Head:   Normal cephalic and atramatic  Lungs:  No wheeze Heart:   HRRR    Abdomen: obese  Neuro: Alert and oriented. Psych:  Normal affect, responds appropriately Skin: No rash   LABS: Basic Metabolic Panel:  Recent Labs  10/21/14 0505 10/22/14 0505  NA 134* 130*  K 4.4 4.7  CL 96 93*  CO2 30 27  GLUCOSE 122* 145*  BUN 12 14  CREATININE 0.86 0.88  CALCIUM 9.3 9.2   Liver Function Tests: No results for input(s): AST, ALT, ALKPHOS, BILITOT, PROT, ALBUMIN in the last 72 hours. No results for input(s): LIPASE, AMYLASE in the last 72 hours. CBC:  Recent Labs  10/22/14 0505 10/23/14 0510  WBC 18.5* 20.7*  HGB 11.2* 10.4*  HCT 32.9* 31.7*  MCV 70.1* 71.4*  PLT 90* 80*   Cardiac Enzymes: No results for input(s): CKTOTAL, CKMB, CKMBINDEX, TROPONINI in the last 72 hours. BNP: Invalid input(s): POCBNP D-Dimer: No results for input(s): DDIMER in the last 72 hours. Hemoglobin A1C: No results for input(s): HGBA1C in the last 72 hours. Fasting Lipid Panel: No results for input(s): CHOL, HDL, LDLCALC, TRIG, CHOLHDL, LDLDIRECT in the last 72 hours. Thyroid Function Tests: No results for input(s): TSH, T4TOTAL, T3FREE, THYROIDAB in  the last 72 hours.  Invalid input(s): FREET3 Anemia Panel: No results for input(s): VITAMINB12, FOLATE, FERRITIN, TIBC, IRON, RETICCTPCT in the last 72 hours. Coag Panel:   Lab Results  Component Value Date   INR 1.08 09/02/2014   INR 2.40* 08/25/2014   PROTIME * 09/02/2014    RADIOLOGY: Ct Abdomen Pelvis Wo Contrast  10/13/2014   CLINICAL DATA:  Newly diagnosed metastatic lung cancer. No abdominal plate.  EXAM: CT ABDOMEN AND PELVIS WITHOUT CONTRAST  TECHNIQUE: Multidetector CT imaging of the abdomen and pelvis was performed following the standard protocol without IV contrast.  COMPARISON:  Chest CT 10/11/2014  FINDINGS: Right middle lobe mass at the right lung base again noted, measuring up to 3.8 cm compared with 3.6 cm. The slight change likely related to differences in scan planes. Other smaller bilateral lower lobe nodules are noted as measured on recent chest CT. Small right pleural effusion. Atelectasis in the right lower lobe and right middle lobe at the right lung base. Heart is normal size.  Numerous subcutaneous and chest wall nodules noted, the largest in the left lower chest wall measuring up to 2.4 cm, stable.  3.2 cm right adrenal mass. Left adrenal mass measures up to 4.1 cm. These slight differences likely  reflect slight differences in scan planes. No definite focal hepatic abnormality on this unenhanced study. Spleen, pancreas have an unremarkable unenhanced appearance.  Innumerable retroperitoneal nodules and masses. Index left retroperitoneal nodule on image 45 measures 2.4 cm. Numerous other small retroperitoneal/perinephric nodules bilaterally. Numerous subcutaneous nodules throughout the subcutaneous soft tissues of the abdomen and pelvis. Index right lateral subcutaneous nodule measures 1.8 cm on image 51. Index left anterior pelvic subcutaneous nodule measures 2.4 cm on image 68. Subcutaneous nodules within the inguinal regions bilaterally. Large right anterior subcutaneous  nodule superiorly in the abdomen over the in the liver measures 2.8 cm on image 27.  Uterus and adnexa have an unremarkable unenhanced appearance. Urinary bladder unremarkable. Moderate stool burden throughout the colon. Small bowel is decompressed. Aorta is normal caliber.  No acute bony abnormality or focal bone lesion.  IMPRESSION: Extensive metastatic disease including in the low lower lung zones bilaterally, bilateral adrenal glands, as well as numerous retroperitoneal and subcutaneous nodules.  Study is limited in evaluation of the solid organs without intravenous contrast.  Dominant mass in the right middle lobe at the right lung base.  Bibasilar atelectasis.   Electronically Signed   By: Rolm Baptise M.D.   On: 10/13/2014 16:49   Dg Chest 2 View  10/23/2014   CLINICAL DATA:  Leukocytosis.  Chest pain.  EXAM: CHEST  2 VIEW  COMPARISON:  10/21/2014  FINDINGS: Stable right PICC. Stable volume loss at the right base associated with atelectasis versus airspace disease. Mild linear atelectasis at the left base. No pneumothorax. Increasing right peritracheal density is of unknown significance.  IMPRESSION: Right basilar atelectasis versus airspace disease.  Increasing right paratracheal density which may represent collapsed upper lobe. Attention on follow-up.   Electronically Signed   By: Maryclare Bean M.D.   On: 10/23/2014 12:16   Dg Chest 2 View  10/21/2014   CLINICAL DATA:  Shortness of breath, history of lung cancer  EXAM: CHEST  2 VIEW  COMPARISON:  11/5/ 15  FINDINGS: Cardiomediastinal silhouette is stable. Elevation of the right hemidiaphragm again noted. Stable nodular lesion right base. Again noted right base atelectasis or infiltrate. No pulmonary edema. Mild degenerative changes thoracic spine. Mild left basilar atelectasis.  IMPRESSION: Again noted nodular lesion right base. Elevation of the right hemidiaphragm. Stable right basilar atelectasis or infiltrate. No pulmonary edema.   Electronically  Signed   By: Lahoma Crocker M.D.   On: 10/21/2014 14:17   Dg Chest 2 View  10/11/2014   CLINICAL DATA:  Chest pain  EXAM: CHEST  2 VIEW  COMPARISON:  CT from earlier in the same day  FINDINGS: Cardiac show remains elevated with right basilar atelectasis. A dominant right pulmonary nodule is noted as well as some smaller nodules similar to that seen on the prior CT examination. No sizable effusion is noted. No bony abnormality is seen.  IMPRESSION: Right basilar atelectasis with evidence of multiple pulmonary nodules.   Electronically Signed   By: Inez Catalina M.D.   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 Now with shortness of breath. History of DVT.  EXAM: CT ANGIOGRAPHY CHEST WITH CONTRAST  TECHNIQUE: Multidetector CT imaging of the chest was performed using the standard protocol during bolus administration of intravenous contrast. Multiplanar CT image reconstructions and MIPs were obtained to evaluate the  vascular anatomy.  CONTRAST:  26mL OMNIPAQUE IOHEXOL 350 MG/ML SOLN  COMPARISON:  CT of the chest without contrast on 08/12/2014  FINDINGS: There has been significant progression of metastatic lung carcinoma in the chest and visualized upper abdomen. The dominant right middle lobe mass now measures approximately 2.9 x 3.6 cm compared to approximately 2.0 x 2.2 cm on the prior study. A number of metastatic pulmonary nodules are identified in both lungs. Anterior right upper lobe nodule measures 1.2 cm. Superior segment right lower lobe nodule measures 0.5 cm. Nodule abutting the lower major fissure measures 1.3 cm. Right lower lobe peripheral nodule measures 0.7 cm. Several other smaller right lower lobe nodules present. There are at least 6 new pulmonary nodules in the left lower lobe with the largest measuring 1.1 cm. Stable  1.6 cm ground-glass opacity at the right lung apex.  Metastatic adenopathy in the mediastinum also shows significant progression since the prior CT. The dominant anterior mediastinal lymph node mass abutting both the ascending thoracic aorta and SVC now measures approximately 3.2 x 4.7 cm as compared to roughly 2.5 cm in short axis on the previous exam. Largest right peritracheal lymph node measures 1.6 cm in short axis. Subcarinal lymph node mass measures 2.6 x 3.5 cm. Prevascular lymph node mass anterior to the aortic arch shows enlargement and measures 1.5 cm in short axis. Bilateral axillary lymph nodes also show enlargement with the largest left axillary lymph node measuring 2.3 cm in short axis and the largest right axillary lymph node measuring 1.6 cm in short axis.  Multiple metastatic subcutaneous nodules are identified and are completely new since the prior examination. The largest subcutaneous nodule is in the right anterior lower costal subcutaneous fat and measures 2.8 cm in diameter. This was not present previously. A number of other smaller subcutaneous nodules are present in the left anterior upper abdominal wall fat, fat abutting the chest wall laterally on the left and the subcutaneous fat overlying the left scapula.  The visualized upper abdomen also shows evidence of bilateral adrenal metastasis which were visualized previously. The dominant right adrenal metastasis now measures approximately 3.4 x 3.6 cm compared to 2.7 cm in diameter on the prior study. One or 2 separate left adrenal metastases measure in conglomerate dimensions approximately 2.5 x 3.8 cm. New 1.2 cm nodule posterior to the left upper kidney in the perinephric fat is consistent with metastasis.  No evidence of pulmonary embolism. The thoracic aorta is normal in caliber and shows no evidence of dissection. Diffuse spondylosis of the thoracic spine present. There are no visualized bone metastases in the chest. No pleural  effusions or pulmonary infiltrates. There is a small amount of pericardial fluid which appears more prominent compared to the prior CT.  Review of the MIP images confirms the above findings.  IMPRESSION: Marked progression of metastatic disease in the chest and upper abdomen, as detailed above. There is enlargement of the dominant right middle lobe lung carcinoma an a number of new metastatic nodules in both lungs. Metastatic adenopathy in the mediastinum and both axillary regions has increased in size since the prior CT. 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 There are a number of new subcutaneous metastatic nodules in the subcutaneous fat. New posterior left perinephric metastatic nodules also visualized. The patient presumably has not received any interval treatment since the prior CT. Recommend oncologic consultation. A formal PET scan would also be helpful to complete staging evaluation prior to initiation of any cancer treatment. There is  no evidence of pulmonary embolism.  Findings were communicated directly to Dr. Canary Brim in the Emergency Department.   Electronically Signed   By: Aletta Edouard M.D.   On: 10/11/2014 14:30   Mr Jeri Cos RS Contrast  10/11/2014   CLINICAL DATA:  Initial encounter. New diagnosis lung cancer. Right-sided headache, 3 days duration. Staging.  EXAM: MRI HEAD WITHOUT AND WITH CONTRAST  TECHNIQUE: Multiplanar, multiecho pulse sequences of the brain and surrounding structures were obtained without and with intravenous contrast.  CONTRAST:  53mL MULTIHANCE GADOBENATE DIMEGLUMINE 529 MG/ML IV SOLN  COMPARISON:  None.  FINDINGS: There are 3 metastatic lesions noted affecting the brain. Within the right lateral cerebellum, there is a 7 mm metastasis. This contains some petechial blood products. At the right parietal vertex, there is a 9 mm metastasis. This contains minimal petechial blood products. In the left posterior parietal lobe, there is a 5-6 mm metastasis.  No  evidence of ischemic infarction. No evidence of chronic small vessel disease. No hydrocephalus. No extra-axial fluid collection. No calvarial lesion is seen.  In the right orbit, there is a well-circumscribed abnormality measuring approximately 1 cm in size. This probably represents a venous varix. I cannot be certain this does not represent a metastasis to the extra-ocular muscles (superior rectus).  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 Right cerebellum. Right parietal vertex. Left posterior parietal region. Early petechial blood products in the right cerebellar and right parietal lesions.  Orbital lesion on the right associated with the superior rectus muscle. This could represent a venous varix or metastasis. This could be studied with CT or dedicated orbital MRI should differentiation be important.   Electronically Signed   By: Nelson Chimes M.D.   On: 10/11/2014 19:36   Dg Chest Port 1 View  10/15/2014   CLINICAL DATA:  Dyspnea.  EXAM: PORTABLE CHEST - 1 VIEW  COMPARISON:  10/11/2014  FINDINGS: The cardiac silhouette remains enlarged. There is persistent elevation of right hemidiaphragm, not significantly changed. Right-sided lung nodules are again seen, measuring approximately 3.9 cm in the right base and 1.3 cm adjacent to the right hilum. Focal linear opacity in the lateral left mid lung is unchanged, likely platelike atelectasis. Right basilar atelectasis is again seen. No large pleural effusion is identified. No pneumothorax.  IMPRESSION: Stable appearance of the chest with right lung nodules/mass and bibasilar atelectasis.   Electronically Signed   By: Logan Bores   On: 10/15/2014 09:31      ASSESSMENT: Kathyrn Lass:   AFib: Now in NSR.  Switched to PO Amio.   Continue amiodarone load.  Continue metoprolol.  Would not add any other meds in this patient for rate control.  As Amio loads, AFib episodes should decrease.  If hypotension continues to be a problem, could  add digoxin.    On Plavix, but this is not adequate for stroke prevention. Not a candidate for further anticoagulation.    Sabrina Booze., MD  10/23/2014  4:11 PM

## 2014-10-23 NOTE — Progress Notes (Signed)
[facility] is providing the following services: Wheelchair and commode - to be delivered to the patient's home  If patient discharges after hours, please call [phone].   Linward Headland 10/23/2014, 4:18 PM

## 2014-10-23 NOTE — Progress Notes (Signed)
Physical Therapy Treatment Patient Details Name: Sabrina Adkins MRN: [id number] DOB: 31-Oct-1961 Today's Date: 10/23/2014    History of Present Illness 53 yo female admitted with metastatic lung cancer, new brain lesion, SVT, PAF. Hx of DM, anxiety, clotting d/o, HTN, bipolar d/o, DVT, recent diagnosis of lung cancer.     PT Comments    Pt requires max motivation to participate in therapy; she was eventually agreeable and pleasant.  Pt requires significantly increased time for mobility along with min-mod cueing.  Pt fatigued easily after 10 feet of gait training into the hallway and required a sitting rest break; vitals WNL.    Follow Up Recommendations  Home health PT;SNF     Equipment Recommendations  Rolling walker with 5" wheels    Recommendations for Other Services OT consult     Precautions / Restrictions Precautions Precautions: Fall Precaution Comments: monitor HR Restrictions Weight Bearing Restrictions: No    Mobility  Bed Mobility               General bed mobility comments: pt was seen sitting EOB  Transfers Overall transfer level: Needs assistance Equipment used: Rolling walker (2 wheeled) Transfers: Sit to/from Stand Sit to Stand: Min assist         General transfer comment: VCs for hand placement and sequencing  Ambulation/Gait Ambulation/Gait assistance: Min assist Ambulation Distance (Feet): 10 Feet (x2) Assistive device: Rolling walker (2 wheeled) Gait Pattern/deviations: Step-through pattern;Decreased stride length;Shuffle;Trunk flexed Gait velocity: decr   General Gait Details: VCs for RW management and sequencing; pt requires significantly increased time to perform; pt fatigued easily; vitals WNL   Stairs            Wheelchair Mobility    Modified Rankin (Stroke Patients Only)       Balance                                    Cognition Arousal/Alertness: Awake/alert Behavior During Therapy: WFL for  tasks assessed/performed Overall Cognitive Status: Within Functional Limits for tasks assessed                      Exercises      General Comments        Pertinent Vitals/Pain      Home Living                      Prior Function            PT Goals (current goals can now be found in the care plan section) Progress towards PT goals: Progressing toward goals    Frequency  Min 3X/week    PT Plan Current plan remains appropriate    Co-evaluation             End of Session Equipment Utilized During Treatment: Gait belt Activity Tolerance: Patient limited by fatigue Patient left:  (in bathroom; NT notified )     Time: 1102-1130 PT Time Calculation (min) (ACUTE ONLY): 28 min  Charges:                       G Codes:      Miller,Derrick, SPTA 10/23/2014, 12:24 PM   Above reviewed  Rica Koyanagi  PTA [facility]  Acute  Rehab Pager      [phone]

## 2014-10-23 NOTE — Progress Notes (Signed)
Dr. Angelena Form paged, seen patient ordered to go ahead and give Lopressor 2.5mg  as previously ordered with BP 92/62.

## 2014-10-23 NOTE — Progress Notes (Signed)
Patient converted back to NSR at 1530. HR currently in 80s. Will continue to monitor.

## 2014-10-23 NOTE — Progress Notes (Signed)
[facility] PROGRESS NOTE  Louanne Calvillo QIW:[id number] DOB: 04/19/1961 DOA: 10/11/2014  PCP: Philis Fendt, MD  Brief HPI: Sabrina Adkins is a 53 y.o. 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. She presented with painful multiple subcutaneous nodules beneath her breasts, and the anterior and shoulder area and upper abdominal wall. CT chest angiogram revealed rapid progression of her lung cancer, withsubcutaneous nodules actually being metastatic disease, but having intra-abdominal metastasis, as well. She was admitted for chemotherapy. Patient then developed supraventricular tachycardia with heart rate being in the 170s. Seen by cardiology and started on Amiodarone. She was given chemotherapy with Gemzar and Carboplatin on 11/4. Post chemo 11/11.  Assessment and plan:  Metastatic [place] She has new brain mets on brain MRI, per Dr Julien Nordmann radiation oncology saw patient and plan is to start with systemic chemotherapy first. CT abd/pelvis revealed intra-abdominal mets as well. Received Chemo with Gemzar and Carboplatin 11/4. S/P Gemzar 11-11.  Radiation to brain to be pursued at later date. Patient will be moving to [place] after discharge from hospital.  Discussed prognosis with Dr. Julien Nordmann. It is poor and life expectancy is likely 3-6 months at the most.  LE dopplers neg for DVT.   PNA;  Chest xray with air space diseases. Will broad antibiotics to Vancomycin and Aztreonam.   Nausea and Vomiting, abdominal pain.  Was likely due to chemo.  Reglan before meals.   Add Protonix. Bowel regimen.  Nausea at times. Has been able to  tolerates meals.   Leukocytosis; follow repeated Urine culture.  Repeated urine culture no growth to date.  Chest x ray 11-11 negative for PNA.  Trichomonas urine, treated with  flagyl. 1 gr BID for 2 doses. HIV negative.  Repeated chest x ray 11-13: right basilar atelectasis, vs air space diseases. Increasing right paratracheal densiity may represent collapsed upper lobe. Need follow up x ray.  Will broad antibiotics to vancomycin and Aztreonam.   SVT/PAF -Cardiology following and was on Amiodarone infusion for many days. HR continues to spike once in a while. She was changed to oral Amiodarone 11/9 .  -On BB and Plavix as well. Plavix doesn't protect against CVA but considering her poor prgonosis, will not pursue anticoagulation. Plus her brain mets make her very high risk. -She has not been getting metoprolol 4 times a day due to hypotension. Change to BID>   Thrombocytopenia: hold heparin. Scd. Might be related to chemo.  Constipation; will order suppository.   Pedal Edema Improved. No more lasix for now.   Hyponatremia; Mild.   Essential HTN BP soft.   Diabetes mellitus Type 2 Change l;antus to once a day, due to vomiting to avoid hypoglycemia.   UTI Completed course of levaquin. 11/1 Urine cx with 75,000 multiple morphotypes present, none predominant.   DVT Prophylaxis: Heparin SQ    Code Status: Full Code  Family Communication: Discussed with patient. Disposition Plan: plan to discharge in 2 days if stable.   Daughter plans to take her to [place] this weekend. Daughter has found a PCP for her there and medical records are in the process of being sent over. Dr. Argentina Donovan. Office- [phone]. Fax- [phone]. Consultants: Oncology, Cardiology  Procedures:  2 D ECHO Study Conclusions - Left ventricle: The cavity size was normal. Wall thickness wasnormal. Systolic function was normal. The estimated ejectionfraction was in the range  of 55% to 60%. Wall motion was normal;there were no regional wall motion abnormalities. Leftventricular diastolic function parameters were normal. - Pericardium, extracardiac: A trivial pericardial  effusion wasidentified.  LE Venous Dopplers No DVT  Antibiotics: None  Subjective: Had very small BM. Abdominal pain is better since admission. Was able to eat some.  Denies worsening dyspnea.   Objective: Vital Signs  Filed Vitals:   10/22/14 1745 10/22/14 1824 10/22/14 2145 10/23/14 0642  BP: 88/44 111/61 122/65 122/78  Pulse: 75 69 76 65  Temp:   98.4 F (36.9 C) 98.3 F (36.8 C)  TempSrc:   Oral Oral  Resp:   18 18  Height:      Weight:      SpO2:   98% 98%    Intake/Output Summary (Last 24 hours) at 10/23/14 1318 Last data filed at 10/23/14 1130  Gross per 24 hour  Intake    445 ml  Output    201 ml  Net    244 ml   Filed Weights   10/11/14 2029 10/12/14 0440 10/12/14 1722  Weight: 113.717 kg (250 lb 11.2 oz) 113.7 kg (250 lb 10.6 oz) 113 kg (249 lb 1.9 oz)    General appearance: alert, cooperative, appears stated age and no distress.  Resp: No definite crackles. No wheezing. Cardio: regular rate and rhythm, S1, S2 normal, no murmur, click, rub or gallop GI: soft, non-tender; bowel sounds normal; no masses,  no organomegaly Improved edema bilateral LE . Neurologic: no focal deficits  Lab Results:  Basic Metabolic Panel:  Recent Labs Lab 10/18/14 0520 10/19/14 0540 10/20/14 0423 10/21/14 0505 10/22/14 0505  NA 131* 130* 130* 134* 130*  K 4.5 4.2 4.2 4.4 4.7  CL 92* 93* 91* 96 93*  CO2 29 26 30 30 27   GLUCOSE 198* 225* 120* 122* 145*  BUN 21 16 17 12 14   CREATININE 0.84 0.77 0.91 0.86 0.88  CALCIUM 9.5 8.7 9.2 9.3 9.2   Liver Function Tests:  Recent Labs Lab 10/17/14 0459  AST 24  ALT 21  ALKPHOS 66  BILITOT 0.7  PROT 5.4*  ALBUMIN 2.3*   CBC:  Recent Labs Lab 10/19/14 0540 10/20/14 0423 10/21/14 0505 10/22/14 0505 10/23/14 0510  WBC 10.6* 13.5* 16.8* 18.5* 20.7*  HGB 11.2* 11.5* 10.9* 11.2* 10.4*  HCT 33.0* 34.5* 32.9* 32.9* 31.7*  MCV 69.0* 69.3* 69.6* 70.1* 71.4*  PLT 157 151 110* 90* 80*   CBG:  Recent Labs Lab  10/22/14 1132 10/22/14 1655 10/22/14 2134 10/23/14 0743 10/23/14 1210  GLUCAP 187* 207* 203* 165* 215*    Recent Results (from the past 240 hour(s))  Urine culture     Status: None   Collection Time: 10/21/14  1:36 PM  Result Value Ref Range Status   Specimen Description URINE, RANDOM  Final   Special Requests NONE  Final   Culture  Setup Time   Final    10/21/2014 21:41 Performed at [place] Performed at [facility]   Final   Culture NO GROWTH Performed at [facility]   Final   Report Status 10/22/2014 FINAL  Final      Studies/Results: Dg Chest 2 View  10/23/2014   CLINICAL DATA:  Leukocytosis.  Chest pain.  EXAM: CHEST  2 VIEW  COMPARISON:  10/21/2014  FINDINGS: Stable right PICC. Stable volume loss at the right base associated with atelectasis versus airspace disease. Mild linear atelectasis at the  left base. No pneumothorax. Increasing right peritracheal density is of unknown significance.  IMPRESSION: Right basilar atelectasis versus airspace disease.  Increasing right paratracheal density which may represent collapsed upper lobe. Attention on follow-up.   Electronically Signed   By: Maryclare Bean M.D.   On: 10/23/2014 12:16   Dg Chest 2 View  10/21/2014   CLINICAL DATA:  Shortness of breath, history of lung cancer  EXAM: CHEST  2 VIEW  COMPARISON:  11/5/ 15  FINDINGS: Cardiomediastinal silhouette is stable. Elevation of the right hemidiaphragm again noted. Stable nodular lesion right base. Again noted right base atelectasis or infiltrate. No pulmonary edema. Mild degenerative changes thoracic spine. Mild left basilar atelectasis.  IMPRESSION: Again noted nodular lesion right base. Elevation of the right hemidiaphragm. Stable right basilar atelectasis or infiltrate. No pulmonary edema.   Electronically Signed   By: Lahoma Crocker M.D.   On: 10/21/2014 14:17    Medications:  Scheduled: . sodium chloride   Intravenous Once    . amiodarone  400 mg Oral Daily  . clopidogrel  75 mg Oral Daily  . docusate sodium  100 mg Oral BID  . gabapentin  100 mg Oral TID  . insulin aspart  0-20 Units Subcutaneous TID WC  . insulin aspart  0-5 Units Subcutaneous QHS  . insulin aspart  3 Units Subcutaneous TID WC  . insulin glargine  18 Units Subcutaneous Daily  . metoCLOPramide  5 mg Oral TID AC  . metoprolol tartrate  25 mg Oral BID  . multivitamin with minerals  1 tablet Oral Daily  . pantoprazole  40 mg Oral Daily  . QUEtiapine  50 mg Oral QHS  . senna  1 tablet Oral BID   Continuous: . sodium chloride 10 mL/hr at 10/22/14 1918   AQT:[id number] **OR** acetaminophen, albuterol, ALPRAZolam, alum & mag hydroxide-simeth, morphine injection, ondansetron **OR** ondansetron (ZOFRAN) IV, oxyCODONE, polyethylene glycol, promethazine, sodium chloride, sodium chloride, sodium chloride, sodium chloride, sodium chloride  Assessment/Plan:  Principal Problem:   Metastatic lung cancer (metastasis from lung to other site) Active Problems:   DM (diabetes mellitus)   HTN (hypertension)   UTI (lower urinary tract infection)   Supraventricular tachycardia   PAF (paroxysmal atrial fibrillation)   Ventricular tachycardia   Metastatic disease   Paroxysmal atrial fibrillation   Type 2 diabetes mellitus with complication   Nausea and vomiting   Pedal edema      LOS: 12 days   Niel Hummer A  [facility] Pager [phone] 10/23/2014, 1:18 PM  If 8PM-8AM, please contact night-coverage at [email or web address], password [facility]

## 2014-10-23 NOTE — Progress Notes (Signed)
Received a call from [place]  That pt is  Back on afib  HR 130's. EKG done, MD made aware, gave orders.  Cardiology MD paged.

## 2014-10-23 NOTE — Progress Notes (Signed)
OT Cancellation Note  Patient Details Name: Sabrina Adkins MRN: [id number] DOB: 1961/03/30   Cancelled Treatment:    Reason Eval/Treat Not Completed: Other (comment).  Pt just got pain medication and is going to xray.  Will check another time.  Gregori Abril 10/23/2014, 10:08 AM  Lesle Chris, OTR/L [phone] 10/23/2014

## 2014-10-23 NOTE — Progress Notes (Signed)
ANTIBIOTIC CONSULT NOTE - INITIAL  Pharmacy Consult for Vancomycin, Aztreonam Indication: pneumonia  Allergies  Allergen Reactions  . Aspirin Hives, Shortness Of Breath, Itching and Rash    Okay to tolerate coated aspirin-MUST BE E.C.  . Ibuprofen Hives, Shortness Of Breath, Itching and Rash  . Penicillins Anaphylaxis and Hives    THROAT CLOSES  . Orange Juice [Orange Oil]     hives  . Iodine Hives, Itching and Rash  . Latex Hives, Itching and Rash    Patient Measurements: Height: 5\' 5"  (165.1 cm) Weight: 249 lb 1.9 oz (113 kg) IBW/kg (Calculated) : 57  Vital Signs: Temp: 98.3 F (36.8 C) (11/13 0642) Temp Source: Oral (11/13 0642) BP: 122/78 mmHg (11/13 0642) Pulse Rate: 65 (11/13 0642) Intake/Output from previous day: 11/12 0701 - 11/13 0700 In: 1555 [P.O.:480; I.V.:475; IV Piggyback:600] Out: 700 [Urine:700] Intake/Output from this shift: Total I/O In: 120 [P.O.:120] Out: 201 [Urine:200; Stool:1]  Labs:  Recent Labs  10/21/14 0505 10/22/14 0505 10/23/14 0510  WBC 16.8* 18.5* 20.7*  HGB 10.9* 11.2* 10.4*  PLT 110* 90* 80*  CREATININE 0.86 0.88  --    Estimated Creatinine Clearance: 94.8 mL/min (by C-G formula based on Cr of 0.88). No results for input(s): VANCOTROUGH, VANCOPEAK, VANCORANDOM, GENTTROUGH, GENTPEAK, GENTRANDOM, TOBRATROUGH, TOBRAPEAK, TOBRARND, AMIKACINPEAK, AMIKACINTROU, AMIKACIN in the last 72 hours.   Microbiology: Recent Results (from the past 720 hour(s))  Urine culture     Status: None   Collection Time: 10/11/14 12:33 PM  Result Value Ref Range Status   Specimen Description URINE, CLEAN CATCH  Final   Special Requests NONE  Final   Culture  Setup Time   Final    10/12/2014 03:08 Performed at [place]   Final    75,000 COLONIES/ML Performed at [facility]    Culture   Final    Multiple bacterial morphotypes present, none predominant. Suggest appropriate recollection if clinically  indicated. Performed at [facility]    Report Status 10/13/2014 FINAL  Final  MRSA PCR Screening     Status: None   Collection Time: 10/12/14 10:29 AM  Result Value Ref Range Status   MRSA by PCR NEGATIVE NEGATIVE Final    Comment:        The GeneXpert MRSA Assay (FDA approved for NASAL specimens only), is one component of a comprehensive MRSA colonization surveillance program. It is not intended to diagnose MRSA infection nor to guide or monitor treatment for MRSA infections.   Urine culture     Status: None   Collection Time: 10/21/14  1:36 PM  Result Value Ref Range Status   Specimen Description URINE, RANDOM  Final   Special Requests NONE  Final   Culture  Setup Time   Final    10/21/2014 21:41 Performed at [place] Performed at [facility]   Final   Culture NO GROWTH Performed at [facility]   Final   Report Status 10/22/2014 FINAL  Final    Medical History: Past Medical History  Diagnosis Date  . Diabetes mellitus without complication   . Anxiety   . Clotting disorder     right leg DVT  . Coagulopathy 09/02/2014  . Hypertension   . Shortness of breath     "when I am getting ready to lay down"  . Pneumonia     2015  . Depression   . Bipolar disorder   .  GERD (gastroesophageal reflux disease)   . Arthritis     RA  . DVT (deep venous thrombosis)     > 10 years ago, was on coumadin  . Cancer 10/12/2014    metastatic  lung    Assessment: 53 yo female with newly-diagnosed NSCLC, new brain mets, and intra-abdominal mets. Currently undergoing chemo (first day on 11/4, most recent on 11/11). She was treated for UTI with levaquin 11/1-11/7 and treated She is now has increasing leukocytosis. Levaquin was started 11/12, but pharmacy is now consulted to dose broad spectrum aztreonam (PCN allergy) and vancomycin for suspected HCAP.  11/13 >> Vanc >> 11/13 >> Cefepime >>   Today, 10/23/2014    Tmax: 98.4  WBCs: increasing, 20.7  Renal: 0.88, CrCl ~ 95 ml/min (~ 86 ml/min N)   Goal of Therapy:  Vancomycin trough level 15-20 mcg/ml  Plan:   Aztreonam 2g IV q8h  Vancomycin 1g IV q12h.  Measure Vanc trough at steady state.  Follow up renal fxn and culture results.  Follow up for any allergic reactions with Aztreonam and history of penicillin allergy.   Gretta Arab PharmD, BCPS Pager [phone] 10/23/2014 1:25 PM

## 2014-10-23 NOTE — Plan of Care (Signed)
Problem: Phase I Progression Outcomes Goal: Anticoagulation Therapy per MD order Outcome: Completed/Met Date Met:  10/23/14 Goal: Other Phase I Outcomes/Goals Outcome: Not Applicable Date Met:  01/56/15  Problem: Phase II Progression Outcomes Goal: Other Phase II Outcomes/Goals Outcome: Not Applicable Date Met:  37/94/32

## 2014-10-24 DIAGNOSIS — R609 Edema, unspecified: Secondary | ICD-10-CM

## 2014-10-24 DIAGNOSIS — D72829 Elevated white blood cell count, unspecified: Secondary | ICD-10-CM | POA: Insufficient documentation

## 2014-10-24 DIAGNOSIS — J189 Pneumonia, unspecified organism: Secondary | ICD-10-CM

## 2014-10-24 DIAGNOSIS — J9602 Acute respiratory failure with hypercapnia: Secondary | ICD-10-CM | POA: Insufficient documentation

## 2014-10-24 LAB — CBC
HCT: 28.2 % — ABNORMAL LOW (ref 36.0–46.0)
Hemoglobin: 9.4 g/dL — ABNORMAL LOW (ref 12.0–15.0)
MCH: 23.9 pg — AB (ref 26.0–34.0)
MCHC: 33.3 g/dL (ref 30.0–36.0)
MCV: 71.6 fL — ABNORMAL LOW (ref 78.0–100.0)
Platelets: 74 10*3/uL — ABNORMAL LOW (ref 150–400)
RBC: 3.94 MIL/uL (ref 3.87–5.11)
RDW: 13.7 % (ref 11.5–15.5)
WBC: 16.4 10*3/uL — ABNORMAL HIGH (ref 4.0–10.5)

## 2014-10-24 LAB — BASIC METABOLIC PANEL
Anion gap: 10 (ref 5–15)
BUN: 21 mg/dL (ref 6–23)
CALCIUM: 8.8 mg/dL (ref 8.4–10.5)
CO2: 26 mEq/L (ref 19–32)
Chloride: 91 mEq/L — ABNORMAL LOW (ref 96–112)
Creatinine, Ser: 1.05 mg/dL (ref 0.50–1.10)
GFR, EST AFRICAN AMERICAN: 70 mL/min — AB (ref >90)
GFR, EST NON AFRICAN AMERICAN: 60 mL/min — AB (ref >90)
GLUCOSE: 251 mg/dL — AB (ref 70–99)
Potassium: 5.2 mEq/L (ref 3.7–5.3)
SODIUM: 127 meq/L — AB (ref 137–147)

## 2014-10-24 LAB — GLUCOSE, CAPILLARY
GLUCOSE-CAPILLARY: 234 mg/dL — AB (ref 70–99)
Glucose-Capillary: 219 mg/dL — ABNORMAL HIGH (ref 70–99)
Glucose-Capillary: 191 mg/dL — ABNORMAL HIGH (ref 70–99)

## 2014-10-24 LAB — OSMOLALITY, URINE: OSMOLALITY UR: 467 mosm/kg (ref 390–1090)

## 2014-10-24 MED ORDER — SODIUM CHLORIDE 0.9 % IV SOLN
INTRAVENOUS | Status: DC
  Administered 2014-10-24: 50 mL/h via INTRAVENOUS
  Administered 2014-10-25: 13:00:00 via INTRAVENOUS

## 2014-10-24 NOTE — Progress Notes (Signed)
PROGRESS NOTE  Subjective:   Sabrina Adkins is a 53 year old female with a history of metastatic lung cancer, hypertension, diabetes mellitus who is admitted with an abscess. She was found have atrial fibrillation.  The CT angiogram revealed no evidence of pulmonary embolus.  It did show significant evidence of metastatic lung cancer.  We were asked to manage her atrial fibrillation. She's been started on amiodarone. She is in normal sinus rhythm today.  Objective:    Vital Signs:   Temp:  [98.3 F (36.8 C)-98.7 F (37.1 C)] 98.3 F (36.8 C) (11/14 0439) Pulse Rate:  [88-128] 88 (11/14 0439) Resp:  [18-20] 18 (11/14 0439) BP: (89-117)/(49-71) 89/49 mmHg (11/14 0439) SpO2:  [97 %-100 %] 97 % (11/14 0439)  Last BM Date: 10/22/14   24-hour weight change: Weight change:   Weight trends: Filed Weights   10/11/14 2029 10/12/14 0440 10/12/14 1722  Weight: 250 lb 11.2 oz (113.717 kg) 250 lb 10.6 oz (113.7 kg) 249 lb 1.9 oz (113 kg)    Intake/Output:  11/13 0701 - 11/14 0700 In: 120 [P.O.:120] Out: 1151 [Urine:1150; Stool:1] Total I/O In: 120 [P.O.:120] Out: 100 [Urine:100]   Physical Exam: BP 89/49 mmHg  Pulse 88  Temp(Src) 98.3 F (36.8 C) (Oral)  Resp 18  Ht 5\' 5"  (1.651 m)  Wt 249 lb 1.9 oz (113 kg)  BMI 41.46 kg/m2  SpO2 97%  Wt Readings from Last 3 Encounters:  10/12/14 249 lb 1.9 oz (113 kg)  10/07/14 258 lb (117.028 kg)  09/23/14 252 lb 9.6 oz (114.579 kg)    General: Vital signs reviewed and noted. , obese femaile  Head: Normocephalic, atraumatic.  Eyes: conjunctivae/corneas clear.  EOM's intact.   Throat: normal  Neck:  thick, no jvd  Lungs:    clear  Heart:  RR  Abdomen:  Soft, non-tender, non-distended  , obese  Extremities: No edema, no clubbing , no rash   Neurologic: A&O X3, CN II - XII are grossly intact.   Psych: Normal     Labs: BMET:  Recent Labs  10/22/14 0505 10/24/14 0405  NA 130* 127*  K 4.7 5.2  CL 93* 91*  CO2 27 26    GLUCOSE 145* 251*  BUN 14 21  CREATININE 0.88 1.05  CALCIUM 9.2 8.8    Liver function tests: No results for input(s): AST, ALT, ALKPHOS, BILITOT, PROT, ALBUMIN in the last 72 hours. No results for input(s): LIPASE, AMYLASE in the last 72 hours.  CBC:  Recent Labs  10/23/14 0510 10/24/14 0405  WBC 20.7* 16.4*  HGB 10.4* 9.4*  HCT 31.7* 28.2*  MCV 71.4* 71.6*  PLT 80* 74*    Cardiac Enzymes: No results for input(s): CKTOTAL, CKMB, TROPONINI in the last 72 hours.  Coagulation Studies: No results for input(s): LABPROT, INR in the last 72 hours.  Other: Invalid input(s): POCBNP No results for input(s): DDIMER in the last 72 hours. No results for input(s): HGBA1C in the last 72 hours. No results for input(s): CHOL, HDL, LDLCALC, TRIG, CHOLHDL in the last 72 hours. No results for input(s): TSH, T4TOTAL, T3FREE, THYROIDAB in the last 72 hours.  Invalid input(s): FREET3 No results for input(s): VITAMINB12, FOLATE, FERRITIN, TIBC, IRON, RETICCTPCT in the last 72 hours.   Other results:  Tele:  NSR  Medications:    Infusions: . sodium chloride 10 mL/hr at 10/22/14 1918  . sodium chloride 75 mL/hr (10/24/14 0804)    Scheduled Medications: . sodium chloride  Intravenous Once  . amiodarone  400 mg Oral Daily  . aztreonam  2 g Intravenous 3 times per day  . clopidogrel  75 mg Oral Daily  . docusate sodium  100 mg Oral BID  . gabapentin  100 mg Oral TID  . insulin aspart  0-20 Units Subcutaneous TID WC  . insulin aspart  0-5 Units Subcutaneous QHS  . insulin aspart  3 Units Subcutaneous TID WC  . insulin glargine  18 Units Subcutaneous Daily  . metoCLOPramide  5 mg Oral TID AC  . metoprolol tartrate  25 mg Oral BID  . multivitamin with minerals  1 tablet Oral Daily  . pantoprazole  40 mg Oral Daily  . QUEtiapine  50 mg Oral QHS  . senna  1 tablet Oral BID  . vancomycin  1,000 mg Intravenous Q12H    Assessment/ Plan:    1.    Metastatic lung cancer  (metastasis from lung to other site):  She has been found to have mets to the brain.  Prognosis is poor.   plans per medical team.  2. Paroxysmal atrial fib ablation: Continue current medications including amiodaroneand metoprolol.  She is on Plavix but this is not adequate for stroke prevention. She was started on Plavix because of atrial fibrillation and her allergies aspirin. Dr. Elias Else thought that perhaps Plavix may be better than Coumadin at this point since she may have some upcoming procedures.  I do not see any specific advantage in giving Plavix because this would also increase risk of bleeding with any planned procedure.  We will discontinue the Plavix.  She is obese, has cancer, hx of DVT in the past. She was on heparin until yesterday when her platelet counts dropped.   Discussed with Dr. Tyrell Antonio.  She will continue to follow         Disposition:  Length of Stay: 13  Thayer Headings, Brooke Bonito., MD, [facility] 10/24/2014, 8:29 AM Office [phone] Pager [phone]

## 2014-10-24 NOTE — Progress Notes (Signed)
VASCULAR LAB PRELIMINARY  PRELIMINARY  PRELIMINARY  PRELIMINARY  Bilateral lower extremity venous Dopplers completed.    Preliminary report:  There is no DVT or SVT noted in the bilateral lower extremities.   Madline Oesterling, RVT 10/24/2014, 10:56 AM

## 2014-10-24 NOTE — Progress Notes (Signed)
[facility] PROGRESS NOTE  Sabrina Adkins UEK:[id number] DOB: [birth year] DOA: 10/11/2014  PCP: Philis Fendt, MD  Brief HPI: Sabrina Adkins is a 53 y.o. 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. She presented with painful multiple subcutaneous nodules beneath her breasts, and the anterior and shoulder area and upper abdominal wall. CT chest angiogram revealed rapid progression of her lung cancer, withsubcutaneous nodules actually being metastatic disease, but having intra-abdominal metastasis, as well. She was admitted for chemotherapy. Patient then developed supraventricular tachycardia with heart rate being in the 170s. Seen by cardiology and started on Amiodarone. She was given chemotherapy with Gemzar and Carboplatin on 11/4. Post chemo 11/11. Patient with worsening leukocytosis, chest x ray with infiltrates. Antibiotics broad to vancomycin and Aztreonam.   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 CT abd/pelvis revealed intra-abdominal mets as well. Received Chemo with Gemzar and Carboplatin 11/4. S/P Gemzar 11-11.  Radiation to brain to be pursued at later date. Patient will be moving to [place] after discharge from hospital.  Discussed prognosis with Dr. Julien Nordmann. It is poor and life expectancy is likely 3-6 months at the most.  LE dopplers neg for DVT.   PNA;  Chest xray with air space diseases.  Started on Vancomycin and Aztreonam 11-13. Day 2 antibiotics. WBC trending down.   Need repeat Chest x ray. Ordered for 11-15.  Nausea and Vomiting, abdominal pain.  Was likely due to chemo.  Reglan before meals.   Add Protonix. Bowel regimen.  Nausea at times. Has  been able to  tolerates meals.   Leukocytosis; Repeated urine culture no growth to date.  Chest x ray 11-11 negative for PNA.  Trichomonas urine, treated with flagyl. 1 gr BID for 2 doses. HIV negative.  Repeated chest x ray 11-13: right basilar atelectasis, vs air space diseases. Increasing right paratracheal densiity may represent collapsed upper lobe. Need follow up x ray.   broad antibiotics to vancomycin and Aztreonam 11-13.  Trending down on new antibiotics regimen.   SVT/PAF -Cardiology following and was on Amiodarone infusion for many days. HR continues to spike once in a while. She was changed to oral Amiodarone 11/9 .  -On BB and Plavix as well. Plavix doesn't protect against CVA but considering her poor prgonosis, will not pursue anticoagulation. Plus her brain mets make her very high risk. -She has not been getting metoprolol 4 times a day due to hypotension. Change to BID>  -episode of hypotension, metoprolol change to BID.   Thrombocytopenia: hold heparin. Scd. Might be related to chemo. Heparin was discontinue 11-13 due to thrombocytopenia. SCD ordered. Resume heparin for DVT prophylaxis when platelet increasing.   Constipation; had a Bm.   Lower extremity  Edema; worse edema today. Repeated doppler 11-14 negative for DVT.  Received lasix at some point during this admission.    Hyponatremia; worsening sodium level. I will start IV fluids.   Essential HTN BP soft.   Diabetes mellitus Type 2 Change l;antus to once a day, due to vomiting to avoid hypoglycemia.   UTI Completed course of levaquin. 11/1 Urine cx with 75,000 multiple morphotypes present, none predominant.   DVT Prophylaxis: Heparin SQ    Code Status: Full Code  Family Communication: Discussed  with patient. Disposition Plan: plan to discharge in 2 days if stable.   Daughter plans to take her to [place] this weekend. Daughter has found a PCP for her there and medical records are in the process of being  sent over. Dr. Argentina Donovan. Office- [phone]. Fax- [phone]. Consultants: Oncology, Cardiology  Procedures:  2 D ECHO Study Conclusions - Left ventricle: The cavity size was normal. Wall thickness wasnormal. Systolic function was normal. The estimated ejectionfraction was in the range of 55% to 60%. Wall motion was normal;there were no regional wall motion abnormalities. Leftventricular diastolic function parameters were normal. - Pericardium, extracardiac: A trivial pericardial effusion wasidentified.  LE Venous Dopplers No DVT  Antibiotics: None  Subjective: She denies dyspnea, relates some cough.  Abdominal pain controlled.  Has swelling LE. More pronounced.   Objective: Vital Signs  Filed Vitals:   10/23/14 1416 10/23/14 1432 10/23/14 2024 10/24/14 0439  BP: 94/71 92/62 117/58 89/49  Pulse: 120 128 91 88  Temp:  98.7 F (37.1 C) 98.4 F (36.9 C) 98.3 F (36.8 C)  TempSrc:  Oral Oral Oral  Resp: 18 20 20 18   Height:      Weight:      SpO2:  97% 100% 97%    Intake/Output Summary (Last 24 hours) at 10/24/14 0927 Last data filed at 10/24/14 0759  Gross per 24 hour  Intake    120 ml  Output   1051 ml  Net   -931 ml   Filed Weights   10/11/14 2029 10/12/14 0440 10/12/14 1722  Weight: 113.717 kg (250 lb 11.2 oz) 113.7 kg (250 lb 10.6 oz) 113 kg (249 lb 1.9 oz)    General appearance:  appears stated age and no distress.  Resp: No definite crackles. No wheezing. Cardio: regular rate and rhythm, S1, S2 normal, no murmur, click, rub or gallop GI: soft, non-tender; bowel sounds normal; no masses,  no organomegaly Improved edema bilateral LE . Neurologic: no focal deficits  Lab Results:  Basic Metabolic Panel:  Recent Labs Lab 10/19/14 0540 10/20/14 0423 10/21/14 0505 10/22/14 0505 10/24/14 0405  NA 130* 130* 134* 130* 127*  K 4.2 4.2 4.4 4.7 5.2  CL 93* 91* 96 93* 91*  CO2 26 30 30 27 26   GLUCOSE 225* 120* 122* 145* 251*  BUN 16 17 12  14 21   CREATININE 0.77 0.91 0.86 0.88 1.05  CALCIUM 8.7 9.2 9.3 9.2 8.8   Liver Function Tests: No results for input(s): AST, ALT, ALKPHOS, BILITOT, PROT, ALBUMIN in the last 168 hours. CBC:  Recent Labs Lab 10/20/14 0423 10/21/14 0505 10/22/14 0505 10/23/14 0510 10/24/14 0405  WBC 13.5* 16.8* 18.5* 20.7* 16.4*  HGB 11.5* 10.9* 11.2* 10.4* 9.4*  HCT 34.5* 32.9* 32.9* 31.7* 28.2*  MCV 69.3* 69.6* 70.1* 71.4* 71.6*  PLT 151 110* 90* 80* 74*   CBG:  Recent Labs Lab 10/23/14 0743 10/23/14 1210 10/23/14 1703 10/23/14 2130 10/24/14 0724  GLUCAP 165* 215* 227* 195* 234*    Recent Results (from the past 240 hour(s))  Urine culture     Status: None   Collection Time: 10/21/14  1:36 PM  Result Value Ref Range Status   Specimen Description URINE, RANDOM  Final   Special Requests NONE  Final   Culture  Setup Time   Final    10/21/2014 21:41 Performed at [place] Performed at [facility]   Final   Culture NO GROWTH Performed at  [facility]   Final   Report Status 10/22/2014 FINAL  Final      Studies/Results: Dg Chest 2 View  10/23/2014   CLINICAL DATA:  Leukocytosis.  Chest pain.  EXAM: CHEST  2 VIEW  COMPARISON:  10/21/2014  FINDINGS: Stable right PICC. Stable volume loss at the right base associated with atelectasis versus airspace disease. Mild linear atelectasis at the left base. No pneumothorax. Increasing right peritracheal density is of unknown significance.  IMPRESSION: Right basilar atelectasis versus airspace disease.  Increasing right paratracheal density which may represent collapsed upper lobe. Attention on follow-up.   Electronically Signed   By: Maryclare Bean M.D.   On: 10/23/2014 12:16    Medications:  Scheduled: . sodium chloride   Intravenous Once  . amiodarone  400 mg Oral Daily  . aztreonam  2 g Intravenous 3 times per day  . docusate sodium  100 mg Oral BID  . gabapentin  100 mg Oral TID  .  insulin aspart  0-20 Units Subcutaneous TID WC  . insulin aspart  0-5 Units Subcutaneous QHS  . insulin aspart  3 Units Subcutaneous TID WC  . insulin glargine  18 Units Subcutaneous Daily  . metoCLOPramide  5 mg Oral TID AC  . metoprolol tartrate  25 mg Oral BID  . multivitamin with minerals  1 tablet Oral Daily  . pantoprazole  40 mg Oral Daily  . QUEtiapine  50 mg Oral QHS  . senna  1 tablet Oral BID  . vancomycin  1,000 mg Intravenous Q12H   Continuous: . sodium chloride 10 mL/hr at 10/22/14 1918  . sodium chloride 75 mL/hr (10/24/14 0804)   NLZ:[id number] **OR** acetaminophen, albuterol, ALPRAZolam, alum & mag hydroxide-simeth, morphine injection, ondansetron **OR** ondansetron (ZOFRAN) IV, oxyCODONE, polyethylene glycol, promethazine, sodium chloride, sodium chloride, sodium chloride, sodium chloride, sodium chloride  Assessment/Plan:  Principal Problem:   Metastatic lung cancer (metastasis from lung to other site) Active Problems:   DM (diabetes mellitus)   HTN (hypertension)   UTI (lower urinary tract infection)   Supraventricular tachycardia   PAF (paroxysmal atrial fibrillation)   Ventricular tachycardia   Metastatic disease   Paroxysmal atrial fibrillation   Type 2 diabetes mellitus with complication   Nausea and vomiting   Pedal edema   PNA (pneumonia)   Hypotension      LOS: 13 days   Niel Hummer A  [facility] Pager [phone] 10/24/2014, 9:27 AM  If 8PM-8AM, please contact night-coverage at [email or web address], password [facility]

## 2014-10-25 ENCOUNTER — Inpatient Hospital Stay (HOSPITAL_COMMUNITY): Payer: Medicaid Other

## 2014-10-25 LAB — BASIC METABOLIC PANEL
Anion gap: 10 (ref 5–15)
BUN: 21 mg/dL (ref 6–23)
CO2: 26 mEq/L (ref 19–32)
CREATININE: 1.02 mg/dL (ref 0.50–1.10)
Calcium: 8.6 mg/dL (ref 8.4–10.5)
Chloride: 94 mEq/L — ABNORMAL LOW (ref 96–112)
GFR calc non Af Amer: 63 mL/min — ABNORMAL LOW (ref >90)
GFR, EST AFRICAN AMERICAN: 73 mL/min — AB (ref >90)
Glucose, Bld: 227 mg/dL — ABNORMAL HIGH (ref 70–99)
Potassium: 5.3 mEq/L (ref 3.7–5.3)
Sodium: 130 mEq/L — ABNORMAL LOW (ref 137–147)

## 2014-10-25 LAB — CBC
HCT: 27.5 % — ABNORMAL LOW (ref 36.0–46.0)
Hemoglobin: 9.2 g/dL — ABNORMAL LOW (ref 12.0–15.0)
MCH: 23.5 pg — ABNORMAL LOW (ref 26.0–34.0)
MCHC: 33.5 g/dL (ref 30.0–36.0)
MCV: 70.2 fL — AB (ref 78.0–100.0)
PLATELETS: 86 10*3/uL — AB (ref 150–400)
RBC: 3.92 MIL/uL (ref 3.87–5.11)
RDW: 13.6 % (ref 11.5–15.5)
WBC: 8.3 10*3/uL (ref 4.0–10.5)

## 2014-10-25 LAB — GLUCOSE, CAPILLARY
GLUCOSE-CAPILLARY: 225 mg/dL — AB (ref 70–99)
Glucose-Capillary: 203 mg/dL — ABNORMAL HIGH (ref 70–99)
Glucose-Capillary: 205 mg/dL — ABNORMAL HIGH (ref 70–99)
Glucose-Capillary: 190 mg/dL — ABNORMAL HIGH (ref 70–99)
Glucose-Capillary: 193 mg/dL — ABNORMAL HIGH (ref 70–99)

## 2014-10-25 LAB — VANCOMYCIN, TROUGH: Vancomycin Tr: 13 ug/mL (ref 10.0–20.0)

## 2014-10-25 LAB — SODIUM, URINE, RANDOM: Sodium, Ur: <15 mEq/L

## 2014-10-25 MED ORDER — LEVOFLOXACIN 750 MG PO TABS: 750.0000 mg | ORAL_TABLET | Freq: Every day | ORAL | Status: DC

## 2014-10-25 MED ORDER — INSULIN GLARGINE 100 UNIT/ML ~~LOC~~ SOLN
25.0000 [IU] | Freq: Every day | SUBCUTANEOUS | Status: DC
  Administered 2014-10-26: 25 [IU] via SUBCUTANEOUS
  Filled 2014-10-25: qty 0.25

## 2014-10-25 MED ORDER — VANCOMYCIN HCL 10 G IV SOLR
1250.0000 mg | Freq: Two times a day (BID) | INTRAVENOUS | Status: DC
  Administered 2014-10-25: 1250 mg via INTRAVENOUS
  Filled 2014-10-25: qty 1250

## 2014-10-25 MED ORDER — BISACODYL 10 MG RE SUPP
10.0000 mg | Freq: Once | RECTAL | Status: AC
  Administered 2014-10-25: 10 mg via RECTAL
  Filled 2014-10-25: qty 1

## 2014-10-25 MED ORDER — DOXYCYCLINE HYCLATE 100 MG PO TABS
100.0000 mg | ORAL_TABLET | Freq: Two times a day (BID) | ORAL | Status: DC
  Administered 2014-10-25 – 2014-10-26 (×2): 100 mg via ORAL
  Filled 2014-10-25 (×3): qty 1

## 2014-10-25 MED ORDER — FLEET ENEMA 7-19 GM/118ML RE ENEM
1.0000 | ENEMA | Freq: Once | RECTAL | Status: AC
  Administered 2014-10-25: 1 via RECTAL
  Filled 2014-10-25: qty 1

## 2014-10-25 MED ORDER — AMIODARONE HCL 200 MG PO TABS
200.0000 mg | ORAL_TABLET | Freq: Every day | ORAL | Status: DC
  Administered 2014-10-25 – 2014-10-26 (×2): 200 mg via ORAL
  Filled 2014-10-25 (×2): qty 1

## 2014-10-25 NOTE — Progress Notes (Signed)
Occupational Therapy Treatment Patient Details Name: Sabrina Adkins MRN: [id number] DOB: 06-18-1961 Today's Date: 10/25/2014    History of present illness 53 yo female admitted with metastatic lung cancer, new brain lesion, SVT, PAF. Hx of DM, anxiety, clotting d/o, HTN, bipolar d/o, DVT, recent diagnosis of lung cancer.    OT comments  Pt limited by fatique.  Encouraged use of RW, but pt doesn't like this.  Used IV pole, which therapist helped to stabilize. She tends to lean forward when using this for support.  Would be safer with RW  Follow Up Recommendations  SNF;Other (comment) (plans to go to daughter's in [facility])    Financial risk analyst (measurements OT);3 in 1 bedside comode    Recommendations for Other Services      Precautions / Restrictions Precautions Precautions: Fall Precaution Comments: monitor HR       Mobility Bed Mobility               General bed mobility comments: pt was seen sitting EOB  Transfers     Transfers: Sit to/from Stand Sit to Stand: Min assist         General transfer comment: used IV pole--therapist stabilized this.  Pt did not want to use RW    Balance                                   ADL       Grooming: Set up;Sitting:  Lip balm. She was fatiqued after getting to chair                   Toilet Transfer: Minimal assistance;Ambulation (around bed to recliner)             General ADL Comments: Pt performed some grooming tasks this am.  Did not want to perform bathing at this time:  getting ready to go to xray.  She used IV pole to walk around bed with goal of getting to recliner.  Took sitting rest at opposite end of bed due to dyspnea (2/4).  Pt fatiques easily.  Encouraged rest breaks and use of RW.  Pt does not like RW--she feels this made HR go up last time she used it.  She is unsteady with IV pole due to leaning forward to use it.      Vision                      Perception     Praxis      Cognition   Behavior During Therapy: WFL for tasks assessed/performed Overall Cognitive Status: Within Functional Limits for tasks assessed                       Extremity/Trunk Assessment               Exercises     Shoulder Instructions       General Comments      Pertinent Vitals/ Pain       Pain Assessment: No/denies pain  Home Living                                          Prior Functioning/Environment              Frequency Min 2X/week  Progress Toward Goals  OT Goals(current goals can now be found in the care plan section)  Progress towards OT goals: Progressing toward goals (slowly)     Plan      Co-evaluation                 End of Session     Activity Tolerance Patient limited by fatigue   Patient Left in chair;with call bell/phone within reach   Nurse Communication          Time: 0174-9449 OT Time Calculation (min): 20 min  Charges: OT General Charges $OT Visit: 1 Procedure OT Treatments $Therapeutic Activity: 8-22 mins  Raja Liska 10/25/2014, 10:12 AM   Lesle Chris, OTR/L [phone] 10/25/2014

## 2014-10-25 NOTE — Progress Notes (Signed)
[facility] PROGRESS NOTE  Sabrina Adkins LFY:[id number] DOB: October 20, 1961 DOA: 10/11/2014 PCP: Philis Fendt, MD  Assessment/Plan: Metastatic non-small cell lung cancer -Poor prognosis. -Continue chemotherapy as per oncology.  -Patient plans to leave next week to relocate to [facility] to be with her family.  Pneumonia -transition antibiotics to by mouth Levaquin.  Paroxysmal atrial fibrillation  -continue current medications including amiodarone and metoprolol. -Appreciate cardiology recommendations.  Hypertension -Blood pressures a little bit on the lower side of normal. Consider discontinuing metoprolol if she becomes dizzy or lightheaded.  Diabetes -Uncontrolled. + -Increase Lantus to 25 units.  Code Status: full code Family Communication: patient only  Disposition Plan: discharge over the next 1-2 days   Consultants:  cardiology   Antibiotics:  Levaquin   Subjective: No complaints  Objective: Filed Vitals:   10/24/14 1415 10/24/14 2204 10/25/14 0643 10/25/14 1433  BP: 96/55 90/67 90/65  106/44  Pulse: 79 79 76 78  Temp: 97.3 F (36.3 C) 98.3 F (36.8 C) 98.1 F (36.7 C) 97.1 F (36.2 C)  TempSrc: Oral Oral Oral Oral  Resp: 18 18 18 20   Height:      Weight:      SpO2: 99% 99% 99% 100%    Intake/Output Summary (Last 24 hours) at 10/25/14 1624 Last data filed at 10/25/14 1520  Gross per 24 hour  Intake   1060 ml  Output   1000 ml  Net     60 ml   Filed Weights   10/11/14 2029 10/12/14 0440 10/12/14 1722  Weight: 113.717 kg (250 lb 11.2 oz) 113.7 kg (250 lb 10.6 oz) 113 kg (249 lb 1.9 oz)    Exam:   General:  Alert, awake, oriented 3  Cardiovascular: regular rate and rhythm  Respiratory: clear to auscultation bilaterally  Abdomen: obese, soft, nontender, nondistended  Extremities: 1+ pitting edema bilaterally   Neurologic:  nonfocal  Data Reviewed: Basic Metabolic Panel:  Recent Labs Lab 10/20/14 0423  10/21/14 0505 10/22/14 0505 10/24/14 0405 10/25/14 0409  NA 130* 134* 130* 127* 130*  K 4.2 4.4 4.7 5.2 5.3  CL 91* 96 93* 91* 94*  CO2 30 30 27 26 26   GLUCOSE 120* 122* 145* 251* 227*  BUN 17 12 14 21 21   CREATININE 0.91 0.86 0.88 1.05 1.02  CALCIUM 9.2 9.3 9.2 8.8 8.6   Liver Function Tests: No results for input(s): AST, ALT, ALKPHOS, BILITOT, PROT, ALBUMIN in the last 168 hours. No results for input(s): LIPASE, AMYLASE in the last 168 hours. No results for input(s): AMMONIA in the last 168 hours. CBC:  Recent Labs Lab 10/21/14 0505 10/22/14 0505 10/23/14 0510 10/24/14 0405 10/25/14 0409  WBC 16.8* 18.5* 20.7* 16.4* 8.3  HGB 10.9* 11.2* 10.4* 9.4* 9.2*  HCT 32.9* 32.9* 31.7* 28.2* 27.5*  MCV 69.6* 70.1* 71.4* 71.6* 70.2*  PLT 110* 90* 80* 74* 86*   Cardiac Enzymes: No results for input(s): CKTOTAL, CKMB, CKMBINDEX, TROPONINI in the last 168 hours. BNP (last 3 results)  Recent Labs  10/15/14 0514  PROBNP 561.6*   CBG:  Recent Labs Lab 10/24/14 0724 10/24/14 1149 10/24/14 1644 10/24/14 2154 10/25/14 0756  GLUCAP 234* 219* 191* 203* 225*    Recent Results (from the past 240 hour(s))  Urine culture     Status: None   Collection Time: 10/21/14  1:36 PM  Result Value Ref Range Status   Specimen Description URINE, RANDOM  Final   Special Requests NONE  Final   Culture  Setup Time  Final    10/21/2014 21:41 Performed at [place] Performed at [facility]   Final   Culture NO GROWTH Performed at [facility]   Final   Report Status 10/22/2014 FINAL  Final     Studies: Dg Chest 2 View  10/25/2014   CLINICAL DATA:  Pneumonia.  Lung cancer.  EXAM: CHEST  2 VIEW  COMPARISON:  10/23/2014.  FINDINGS: RIGHT upper extremity PICC is present with the tip in the lower SVC at the level of the cavoatrial junction. Cardiomegaly. Elevation of the RIGHT hemidiaphragm with consolidation of the RIGHT base. This  involves the RIGHT middle and RIGHT lower lobes. LEFT lung appears clear. Monitoring leads project over the chest.  Compared to the most recent prior chest radiograph, there is little if any interval change.  RIGHT peritracheal density probably represents mass effect from the mediastinal mass demonstrated on prior CT 10/11/2014.  a  IMPRESSION: 1. Stable RIGHT upper extremity PICC. 2. RIGHT middle lobe and RIGHT lower lobe collapse/consolidation.   Electronically Signed   By: Dereck Ligas M.D.   On: 10/25/2014 13:04    Scheduled Meds: . sodium chloride   Intravenous Once  . amiodarone  200 mg Oral Daily  . aztreonam  2 g Intravenous 3 times per day  . docusate sodium  100 mg Oral BID  . gabapentin  100 mg Oral TID  . insulin aspart  0-20 Units Subcutaneous TID WC  . insulin aspart  0-5 Units Subcutaneous QHS  . insulin aspart  3 Units Subcutaneous TID WC  . insulin glargine  18 Units Subcutaneous Daily  . metoCLOPramide  5 mg Oral TID AC  . metoprolol tartrate  25 mg Oral BID  . multivitamin with minerals  1 tablet Oral Daily  . pantoprazole  40 mg Oral Daily  . QUEtiapine  50 mg Oral QHS  . senna  1 tablet Oral BID  . sodium phosphate  1 enema Rectal Once  . vancomycin  1,250 mg Intravenous Q12H   Continuous Infusions: . sodium chloride 10 mL/hr at 10/22/14 1918  . sodium chloride 50 mL/hr at 10/25/14 1233    Principal Problem:   Metastatic lung cancer (metastasis from lung to other site) Active Problems:   DM (diabetes mellitus)   HTN (hypertension)   UTI (lower urinary tract infection)   Supraventricular tachycardia   PAF (paroxysmal atrial fibrillation)   Ventricular tachycardia   Metastatic disease   Paroxysmal atrial fibrillation   Type 2 diabetes mellitus with complication   Nausea and vomiting   Pedal edema   PNA (pneumonia)   Hypotension   Acute respiratory failure with hypercapnia   Leukocytosis    Time spent: 35 minutes. Greater than 50% of this time was  spent in direct contact with the patient coordinating care.    Lelon Frohlich  [facility] Pager [phone]  If 7PM-7AM, please contact night-coverage at [email or web address], password [facility] 10/25/2014, 4:24 PM  LOS: 14 days

## 2014-10-25 NOTE — Progress Notes (Signed)
ANTIBIOTIC CONSULT NOTE - FOLLOW-UP  Pharmacy Consult for Vancomycin, Aztreonam Indication: Pneumonia  Allergies  Allergen Reactions  . Aspirin Hives, Shortness Of Breath, Itching and Rash    Okay to tolerate coated aspirin-MUST BE E.C.  . Ibuprofen Hives, Shortness Of Breath, Itching and Rash  . Penicillins Anaphylaxis and Hives    THROAT CLOSES  . Orange Juice [Orange Oil]     hives  . Iodine Hives, Itching and Rash  . Latex Hives, Itching and Rash    Patient Measurements: Height: 5\' 5"  (165.1 cm) Weight: 249 lb 1.9 oz (113 kg) IBW/kg (Calculated) : 57  Vital Signs: Temp: 98.1 F (36.7 C) (11/15 0643) Temp Source: Oral (11/15 0643) BP: 90/65 mmHg (11/15 0643) Pulse Rate: 76 (11/15 0643) Intake/Output from previous day: 11/14 0701 - 11/15 0700 In: 360 [P.O.:360] Out: 700 [Urine:700] Intake/Output from this shift: Total I/O In: 240 [P.O.:240] Out: 400 [Urine:400]  Labs:  Recent Labs  10/23/14 0510 10/24/14 0405 10/25/14 0409  WBC 20.7* 16.4* 8.3  HGB 10.4* 9.4* 9.2*  PLT 80* 74* 86*  CREATININE  --  1.05 1.02   Estimated Creatinine Clearance: 81.8 mL/min (by C-G formula based on Cr of 1.02).  Recent Labs  10/25/14 1240  [place] 13.0     Microbiology: Recent Results (from the past 720 hour(s))  Urine culture     Status: None   Collection Time: 10/11/14 12:33 PM  Result Value Ref Range Status   Specimen Description URINE, CLEAN CATCH  Final   Special Requests NONE  Final   Culture  Setup Time   Final    10/12/2014 03:08 Performed at [place]   Final    75,000 COLONIES/ML Performed at [facility]    Culture   Final    Multiple bacterial morphotypes present, none predominant. Suggest appropriate recollection if clinically indicated. Performed at [facility]    Report Status 10/13/2014 FINAL  Final  MRSA PCR Screening     Status: None   Collection Time: 10/12/14 10:29 AM  Result Value Ref  Range Status   MRSA by PCR NEGATIVE NEGATIVE Final    Comment:        The GeneXpert MRSA Assay (FDA approved for NASAL specimens only), is one component of a comprehensive MRSA colonization surveillance program. It is not intended to diagnose MRSA infection nor to guide or monitor treatment for MRSA infections.   Urine culture     Status: None   Collection Time: 10/21/14  1:36 PM  Result Value Ref Range Status   Specimen Description URINE, RANDOM  Final   Special Requests NONE  Final   Culture  Setup Time   Final    10/21/2014 21:41 Performed at [place] Performed at [facility]   Final   Culture NO GROWTH Performed at [facility]   Final   Report Status 10/22/2014 FINAL  Final    Assessment: 53 yo female with newly-diagnosed NSCLC, new brain mets, and intra-abdominal mets. Currently undergoing chemo (first day on 11/4, most recent on 11/11). She was treated for UTI with levaquin 11/1-11/7. She had increasing leukocytosis and was restarted on Levaquin 11/12, but then changed to broad spectrum aztreonam (PCN allergy) and vancomycin for suspected HCAP.  11/1 >> Levaquin >> 11/7 11/12 >> Levaquin >> 11/13 11/13 >> Vancomycin >> 11/13 >> Aztreonam >>   Microbiology: 11/1 urine: 75000 colonies/mL multiple bacterial morphotypes  present, none predominant 11/11 urine: NGF  Today, 11/15: Tmax: AF WBCs: now WNL Renal: SCr 1.02, CrCl ~ 82 ml/min CG Vancomycin trough 1240: 13 mcg/mL (~ 10 hour level)  Goal of Therapy:  Vancomycin trough level 15-20 mcg/ml  Plan:   Increase Vancomycin to 1250 mg IV q12h due to subtherapeutic trough.  Recheck Vancomycin trough level at new steady state.  Continue Aztreonam 2g IV q8h  Monitor renal function, cultures, clinical course.   Lindell Spar, PharmD, BCPS Pager: [phone] 10/25/2014 1:57 PM

## 2014-10-25 NOTE — Progress Notes (Addendum)
PROGRESS NOTE  Subjective:   Sabrina Adkins is a 53 year old female with a history of metastatic lung cancer, hypertension, diabetes mellitus who is admitted with an abscess. She was found have atrial fibrillation.  The CT angiogram revealed no evidence of pulmonary embolus.  It did show significant evidence of metastatic lung cancer.  We were asked to manage her atrial fibrillation. She's been started on amiodarone. She is in normal sinus rhythm today.  Objective:    Vital Signs:   Temp:  [97.3 F (36.3 C)-98.3 F (36.8 C)] 98.1 F (36.7 C) (11/15 0643) Pulse Rate:  [76-87] 76 (11/15 0643) Resp:  [18] 18 (11/15 0643) BP: (90-116)/(55-67) 90/65 mmHg (11/15 0643) SpO2:  [99 %] 99 % (11/15 0643)  Last BM Date: 10/23/14   24-hour weight change: Weight change:   Weight trends: Filed Weights   10/11/14 2029 10/12/14 0440 10/12/14 1722  Weight: 250 lb 11.2 oz (113.717 kg) 250 lb 10.6 oz (113.7 kg) 249 lb 1.9 oz (113 kg)    Intake/Output:  11/14 0701 - 11/15 0700 In: 360 [P.O.:360] Out: 700 [Urine:700]     Physical Exam: BP 90/65 mmHg  Pulse 76  Temp(Src) 98.1 F (36.7 C) (Oral)  Resp 18  Ht 5\' 5"  (1.651 m)  Wt 249 lb 1.9 oz (113 kg)  BMI 41.46 kg/m2  SpO2 99%  Wt Readings from Last 3 Encounters:  10/12/14 249 lb 1.9 oz (113 kg)  10/07/14 258 lb (117.028 kg)  09/23/14 252 lb 9.6 oz (114.579 kg)    General: Vital signs reviewed and noted. , obese femaile  Head: Normocephalic, atraumatic.  Eyes: conjunctivae/corneas clear.  EOM's intact.   Throat: normal  Neck:  thick, no jvd  Lungs:    clear  Heart:  RR  Abdomen:  Soft, non-tender, non-distended  , obese  Extremities: No edema, no clubbing , no rash   Neurologic: A&O X3, CN II - XII are grossly intact.   Psych: Normal     Labs: BMET:  Recent Labs  10/24/14 0405 10/25/14 0409  NA 127* 130*  K 5.2 5.3  CL 91* 94*  CO2 26 26  GLUCOSE 251* 227*  BUN 21 21  CREATININE 1.05 1.02  CALCIUM 8.8 8.6      Liver function tests: No results for input(s): AST, ALT, ALKPHOS, BILITOT, PROT, ALBUMIN in the last 72 hours. No results for input(s): LIPASE, AMYLASE in the last 72 hours.  CBC:  Recent Labs  10/24/14 0405 10/25/14 0409  WBC 16.4* 8.3  HGB 9.4* 9.2*  HCT 28.2* 27.5*  MCV 71.6* 70.2*  PLT 74* 86*    Cardiac Enzymes: No results for input(s): CKTOTAL, CKMB, TROPONINI in the last 72 hours.  Coagulation Studies: No results for input(s): LABPROT, INR in the last 72 hours.  Other: Invalid input(s): POCBNP No results for input(s): DDIMER in the last 72 hours. No results for input(s): HGBA1C in the last 72 hours. No results for input(s): CHOL, HDL, LDLCALC, TRIG, CHOLHDL in the last 72 hours. No results for input(s): TSH, T4TOTAL, T3FREE, THYROIDAB in the last 72 hours.  Invalid input(s): FREET3 No results for input(s): VITAMINB12, FOLATE, FERRITIN, TIBC, IRON, RETICCTPCT in the last 72 hours.   Other results:  Tele:  NSR  Medications:    Infusions: . sodium chloride 10 mL/hr at 10/22/14 1918  . sodium chloride 50 mL/hr (10/24/14 1653)    Scheduled Medications: . sodium chloride   Intravenous Once  . amiodarone  400 mg  Oral Daily  . aztreonam  2 g Intravenous 3 times per day  . docusate sodium  100 mg Oral BID  . gabapentin  100 mg Oral TID  . insulin aspart  0-20 Units Subcutaneous TID WC  . insulin aspart  0-5 Units Subcutaneous QHS  . insulin aspart  3 Units Subcutaneous TID WC  . insulin glargine  18 Units Subcutaneous Daily  . metoCLOPramide  5 mg Oral TID AC  . metoprolol tartrate  25 mg Oral BID  . multivitamin with minerals  1 tablet Oral Daily  . pantoprazole  40 mg Oral Daily  . QUEtiapine  50 mg Oral QHS  . senna  1 tablet Oral BID  . vancomycin  1,000 mg Intravenous Q12H    Assessment/ Plan:    1.    Metastatic lung cancer (metastasis from lung to other site):  She has been found to have mets to the brain.  Prognosis is poor.   plans per  medical team.  2. Paroxysmal atrial fib ablation: Continue current medications including amiodarone and metoprolol.    She is obese, has cancer, hx of DVT in the past. She was on heparin until yesterday when her platelet counts dropped. Ideally , she should be on anticoagulation but we are limited by her other medical isses.    Discussed with Dr. Tyrell Antonio.  She will continue to follow  3. HTN:   on the low side.  Continue metoprolol for HR control as she tolerates.  If she becomes light headed, I would DC the metoprolol.  No new recs. I have decreased her amio to 200 mg a day. She plans on moving to  [place] upon discharge.   we will sign off. Call for questions      Disposition:  Length of Stay: 14  Thayer Headings, Brooke Bonito., MD, [facility] 10/25/2014, 7:41 AM Office [phone] Pager [phone]

## 2014-10-26 ENCOUNTER — Ambulatory Visit: Payer: Medicaid Other | Admitting: Radiation Oncology

## 2014-10-26 ENCOUNTER — Ambulatory Visit: Payer: Medicaid Other

## 2014-10-26 LAB — CBC
HCT: 27.4 % — ABNORMAL LOW (ref 36.0–46.0)
HEMOGLOBIN: 9.1 g/dL — AB (ref 12.0–15.0)
MCH: 23.6 pg — ABNORMAL LOW (ref 26.0–34.0)
MCHC: 33.2 g/dL (ref 30.0–36.0)
MCV: 71.2 fL — AB (ref 78.0–100.0)
PLATELETS: 64 10*3/uL — AB (ref 150–400)
RBC: 3.85 MIL/uL — AB (ref 3.87–5.11)
RDW: 13.8 % (ref 11.5–15.5)
WBC: 5.3 10*3/uL (ref 4.0–10.5)

## 2014-10-26 LAB — BASIC METABOLIC PANEL
Anion gap: 11 (ref 5–15)
BUN: 17 mg/dL (ref 6–23)
CHLORIDE: 94 meq/L — AB (ref 96–112)
CO2: 26 mEq/L (ref 19–32)
Calcium: 8.8 mg/dL (ref 8.4–10.5)
Creatinine, Ser: 0.89 mg/dL (ref 0.50–1.10)
GFR calc Af Amer: 86 mL/min — ABNORMAL LOW (ref >90)
GFR, EST NON AFRICAN AMERICAN: 74 mL/min — AB (ref >90)
GLUCOSE: 158 mg/dL — AB (ref 70–99)
Potassium: 5.2 mEq/L (ref 3.7–5.3)
SODIUM: 131 meq/L — AB (ref 137–147)

## 2014-10-26 LAB — GLUCOSE, CAPILLARY
Glucose-Capillary: 199 mg/dL — ABNORMAL HIGH (ref 70–99)
Glucose-Capillary: 197 mg/dL — ABNORMAL HIGH (ref 70–99)

## 2014-10-26 MED ORDER — DSS 100 MG PO CAPS: 100.0000 mg | ORAL_CAPSULE | Freq: Two times a day (BID) | ORAL | Status: AC

## 2014-10-26 MED ORDER — DOXYCYCLINE HYCLATE 100 MG PO TABS: 100.0000 mg | ORAL_TABLET | Freq: Two times a day (BID) | ORAL | Status: AC

## 2014-10-26 MED ORDER — ACETAMINOPHEN 325 MG PO TABS: 650.0000 mg | ORAL_TABLET | Freq: Four times a day (QID) | ORAL | Status: AC | PRN

## 2014-10-26 MED ORDER — AMIODARONE HCL 200 MG PO TABS: 200.0000 mg | ORAL_TABLET | Freq: Every day | ORAL | Status: AC

## 2014-10-26 MED ORDER — PANTOPRAZOLE SODIUM 40 MG PO TBEC: 40.0000 mg | DELAYED_RELEASE_TABLET | Freq: Every day | ORAL | Status: AC

## 2014-10-26 MED ORDER — DIAZEPAM 5 MG PO TABS: 5.0000 mg | ORAL_TABLET | Freq: Two times a day (BID) | ORAL | Status: AC

## 2014-10-26 MED ORDER — OXYCODONE HCL 10 MG PO TABS: 10.0000 mg | ORAL_TABLET | ORAL | Status: AC | PRN

## 2014-10-26 MED ORDER — POLYETHYLENE GLYCOL 3350 17 G PO PACK: 17.0000 g | PACK | Freq: Every day | ORAL | Status: AC | PRN

## 2014-10-26 MED ORDER — METOPROLOL TARTRATE 25 MG PO TABS: 25.0000 mg | ORAL_TABLET | Freq: Two times a day (BID) | ORAL | Status: AC

## 2014-10-26 NOTE — Unmapped (Signed)
Department of Internal Medicine  Office Visit Note    Please disregard note, appointment was cancelled.     History / Subjective     Kathy Camacho is a 53 yo female with a PMHx of squamous cell lung cancer (?), h/o DVT (previously treated with coumadin ~10 years ago), DM type II, HTN, HLD, Depression, GERD, Bipolar Disorder who presents to establish care with new PCP.      Review of Systems (Focused)     ROS        Medical, Surgical, Family and Social History     No past medical history on file.  No past surgical history on file.  No family history on file.  History     Social History   ??? Marital Status: Married     Spouse Name: N/A     Number of Children: N/A   ??? Years of Education: N/A     Occupational History   ??? Not on file.     Social History Main Topics   ??? Smoking status: Not on file   ??? Smokeless tobacco: Not on file   ??? Alcohol Use: Not on file   ??? Drug Use: Not on file   ??? Sexual Activity: Not on file     Other Topics Concern   ??? Not on file     Social History Narrative   ??? No narrative on file         Medications     No current outpatient prescriptions on file.     No current facility-administered medications for this visit.       Allergies not on file       Vital Signs / Physical Exam     There were no vitals taken for this visit.    Physical Exam         Laboratory Data     Bilateral LE Duplex 10/16/14:  - no obvious evidence of deep vein or superficial thrombosis involving the right lower extremity and left lower extremity. No evidence of Baker's cyst on the right or left.    CT abd/pelvis wo contrast 10/13/14:  - report impression Extensive metastatic disease including in the low lower lung zones bilaterally, bilateral adrenal glands, as well as numerous retroperitoneal and subcutaneous nodules. Study is limited in evaluation of the solid organs without intravenous contrast. Dominant mass in the right middle lobe at the right lung base. Bibasilar atelectasis.    TTE 10/12/14:  - EF 55-60%. No regional  wall motion abnormalities. RV systolic function was normal. PA pressures could not be accurately estimated. Trivial pericardial effusion was identified.     MRI Brain w/wo contrast 10/11/14:  - report impression Three brain metastases without significant mass effect or edema. Right cerebellum. Right parietal vertex. Left posterior parietal region. Early petechial blood products in the right cerebellar and right parietal lesions. Orbital lesion on the right associated with the superior rectus muscle. This could represent a venous varix or metastasis. This could be studied with CT or dedicated orbital MRI should differentiation be important.    CT Angio Chest PE 10/11/14:  - report impression Marked progression of metastatic disease in the chest and upper abdomen, as detailed above. There is enlargement of the dominant right middle lobe lung carcinoma and a number of new metastatic nodules in both lungs. Metastatic adenopathy in the mediastinum and both axillary regions has increased in size since the prior CT. 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 There are  a number of new subcutaneous metastatic nodules in the subcutaneous fat. New posterior left perinephric metastatic nodules also visualized. The patient presumably has not received any interval treatment since the prior CT. Recommend oncologic consultation. A formal PET scan would also be helpful to complete staging evaluation prior to initiation of any cancer treatment. There is no evidence of pulmonary embolism.     Problem List / Health Maintenance     There is no problem list on file for this patient.      Health Maintenance Due   Topic   ??? PREVENTATIVE CARE    ??? PAP SMEAR    ??? HIV    ??? MAMMOGRAM    ??? COLONOSCOPY    ??? FLU VACCINE            Assessment & Plan     Kathy Camacho is a 53 y.o. female. The medical issues being addressed in today's encounter are as follows:              Orders Placed / Follow Up Appointments     No orders of the  defined types were placed in this encounter.       Future Appointments  Date Time Provider Department Center   10/27/2014 1:40 PM Forbes Cellar, DO [facility]           Signed:  Forbes Cellar, DO  10/26/2014, 10:21 PM

## 2014-10-26 NOTE — Discharge Summary (Signed)
Physician Discharge Summary  Sabrina Adkins VCB:[id number] DOB: 1961/07/21 DOA: 10/11/2014  PCP: Philis Fendt, MD  Admit date: 10/11/2014 Discharge date: 10/26/2014  Time spent: 45 minutes  Recommendations for Outpatient Follow-up:  -Will be discharged home today. -Patient has plans to relocate to [facility] where all of her family is.   Discharge Diagnoses:  Principal Problem:   Metastatic lung cancer (metastasis from lung to other site) Active Problems:   DM (diabetes mellitus)   HTN (hypertension)   UTI (lower urinary tract infection)   Supraventricular tachycardia   PAF (paroxysmal atrial fibrillation)   Ventricular tachycardia   Metastatic disease   Paroxysmal atrial fibrillation   Type 2 diabetes mellitus with complication   Nausea and vomiting   Pedal edema   PNA (pneumonia)   Hypotension   Acute respiratory failure with hypercapnia   Leukocytosis   Discharge Condition: Stable  Filed Weights   10/11/14 2029 10/12/14 0440 10/12/14 1722  Weight: 113.717 kg (250 lb 11.2 oz) 113.7 kg (250 lb 10.6 oz) 113 kg (249 lb 1.9 oz)    History of present illness:  Sabrina Adkins is a 53 y.o. 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, patient was being planned to be seen by oncology service for further workup next week, she presents today with painful multiple subcutaneous nodules beneath her breasts, and the anterior and shoulder area and upper abdomen Wall,she thought initially they are boils, NAD patient was tachycardic, so she had CT chest angiogram to rule out PE, which was negative for PE, but did show rapid progression of her lung cancer, with subcutaneous nodules actually being metastatic disease, but having intra-abdominal metastasis, as well patient workup was significant for UTI, ED  discussed with oncology on call who requested patient be admitted on transfer to [facility] long hospital where she will be evaluated by them. Patient developed supraventricular tachycardia with heart rate being in the 170s, initial EKG showing supraventricular tachycardia with reentry type, as well at one points patient appears to be having A. Fib on telemetry monitor, she was given IV Cardizem 10 mg 2, heart rate is currently back to normal sinus rhythm.  Hospital Course:   Metastatic non-small cell lung cancer -Poor prognosis. -Continue chemotherapy as per oncology.  -Patient plans to leave next week to relocate to [facility] to be with her family.  Pneumonia -transition antibiotics to by mouth Levaquinfor 5 more days on discharge.  Paroxysmal atrial fibrillation  -continue current medications including amiodarone and metoprolol. -Appreciate cardiology recommendations.  Hypertension -Blood pressures a little bit on the lower side of normal. Consider discontinuing metoprolol if she becomes dizzy or lightheaded.  Diabetes -Uncontrolled.  -continue to titrate insulin in the outpatient setting.  Procedures:  none   Consultations:  Cardiology  Oncology  Palliative care  Discharge Instructions  Discharge Instructions    Increase activity slowly    Complete by:  As directed      TREATMENT CONDITIONS    Complete by:  As directed   Patient should have CBC & CMP within 7 days prior to chemotherapy administration. NOTIFY MD IF: ANC < 1500, Hemoglobin < 8, PLT < 100,000,  Total Bili > 1.5, Creatinine > 1.5, ALT & AST > 80 or if patient has unstable vital signs: Temperature > 38.5, SBP > 180 or < 90, RR > 30 or HR >  100.     TREATMENT CONDITIONS    Complete by:  As directed   Patient should have CBC & CMP within 7 days prior to chemotherapy administration. NOTIFY MD IF: ANC < 1500, Hemoglobin < 8, PLT < 100,000,  Total Bili > 1.5, Creatinine > 1.5, ALT & AST > 80 or if patient has  unstable vital signs: Temperature > 38.5, SBP > 180 or < 90, RR > 30 or HR > 100.            Medication List    STOP taking these medications        ALPRAZolam 0.5 MG tablet  Commonly known as:  XANAX     amLODipine 5 MG tablet  Commonly known as:  NORVASC     dicloxacillin 500 MG capsule  Commonly known as:  DYNAPEN     hydrochlorothiazide 25 MG tablet  Commonly known as:  HYDRODIURIL     lisinopril 40 MG tablet  Commonly known as:  PRINIVIL,ZESTRIL      TAKE these medications        acetaminophen 325 MG tablet  Commonly known as:  TYLENOL  Take 2 tablets (650 mg total) by mouth every 6 (six) hours as needed for mild pain (or Fever >/= 101).     amiodarone 200 MG tablet  Commonly known as:  PACERONE  Take 1 tablet (200 mg total) by mouth daily.     diazepam 5 MG tablet  Commonly known as:  VALIUM  Take 1 tablet (5 mg total) by mouth 2 (two) times daily.     doxycycline 100 MG tablet  Commonly known as:  VIBRA-TABS  Take 1 tablet (100 mg total) by mouth every 12 (twelve) hours.     DSS 100 MG Caps  Take 100 mg by mouth 2 (two) times daily.     gabapentin 100 MG capsule  Commonly known as:  NEURONTIN  Take 1 capsule (100 mg total) by mouth 3 (three) times daily.     glipiZIDE 5 MG tablet  Commonly known as:  GLUCOTROL  Take 2 tablets (10 mg total) by mouth 2 (two) times daily before a meal.     insulin glargine 100 UNIT/ML injection  Commonly known as:  LANTUS  Inject 0.35 mLs (35 Units total) into the skin at bedtime.     LORazepam 0.5 MG tablet  Commonly known as:  ATIVAN  Take 1 tablet (0.5 mg total) by mouth 2 (two) times daily as needed. For nerves     metFORMIN 500 MG tablet  Commonly known as:  GLUCOPHAGE  Take 2 tablets (1,000 mg total) by mouth 2 (two) times daily with a meal.     metoprolol tartrate 25 MG tablet  Commonly known as:  LOPRESSOR  Take 1 tablet (25 mg total) by mouth 2 (two) times daily.     multivitamin with minerals Tabs  tablet  Take 1 tablet by mouth daily.     Oxycodone HCl 10 MG Tabs  Take 1 tablet (10 mg total) by mouth every 4 (four) hours as needed for moderate pain.     pantoprazole 40 MG tablet  Commonly known as:  PROTONIX  Take 1 tablet (40 mg total) by mouth daily.     pioglitazone 30 MG tablet  Commonly known as:  ACTOS  Take 1 tablet (30 mg total) by mouth daily.     polyethylene glycol packet  Commonly known as:  MIRALAX / GLYCOLAX  Take 17 g by  mouth daily as needed for mild constipation.     QUEtiapine 50 MG tablet  Commonly known as:  SEROQUEL  Take 1 tablet (50 mg total) by mouth at bedtime.       Allergies  Allergen Reactions  . Aspirin Hives, Shortness Of Breath, Itching and Rash    Okay to tolerate coated aspirin-MUST BE E.C.  . Ibuprofen Hives, Shortness Of Breath, Itching and Rash  . Penicillins Anaphylaxis and Hives    THROAT CLOSES  . Orange Juice [Orange Oil]     hives  . Iodine Hives, Itching and Rash  . Latex Hives, Itching and Rash      The results of significant diagnostics from this hospitalization (including imaging, microbiology, ancillary and laboratory) are listed below for reference.    Significant Diagnostic Studies: Ct Abdomen Pelvis Wo Contrast  10/13/2014   CLINICAL DATA:  Newly diagnosed metastatic lung cancer. No abdominal plate.  EXAM: CT ABDOMEN AND PELVIS WITHOUT CONTRAST  TECHNIQUE: Multidetector CT imaging of the abdomen and pelvis was performed following the standard protocol without IV contrast.  COMPARISON:  Chest CT 10/11/2014  FINDINGS: Right middle lobe mass at the right lung base again noted, measuring up to 3.8 cm compared with 3.6 cm. The slight change likely related to differences in scan planes. Other smaller bilateral lower lobe nodules are noted as measured on recent chest CT. Small right pleural effusion. Atelectasis in the right lower lobe and right middle lobe at the right lung base. Heart is normal size.  Numerous subcutaneous  and chest wall nodules noted, the largest in the left lower chest wall measuring up to 2.4 cm, stable.  3.2 cm right adrenal mass. Left adrenal mass measures up to 4.1 cm. These slight differences likely reflect slight differences in scan planes. No definite focal hepatic abnormality on this unenhanced study. Spleen, pancreas have an unremarkable unenhanced appearance.  Innumerable retroperitoneal nodules and masses. Index left retroperitoneal nodule on image 45 measures 2.4 cm. Numerous other small retroperitoneal/perinephric nodules bilaterally. Numerous subcutaneous nodules throughout the subcutaneous soft tissues of the abdomen and pelvis. Index right lateral subcutaneous nodule measures 1.8 cm on image 51. Index left anterior pelvic subcutaneous nodule measures 2.4 cm on image 68. Subcutaneous nodules within the inguinal regions bilaterally. Large right anterior subcutaneous nodule superiorly in the abdomen over the in the liver measures 2.8 cm on image 27.  Uterus and adnexa have an unremarkable unenhanced appearance. Urinary bladder unremarkable. Moderate stool burden throughout the colon. Small bowel is decompressed. Aorta is normal caliber.  No acute bony abnormality or focal bone lesion.  IMPRESSION: Extensive metastatic disease including in the low lower lung zones bilaterally, bilateral adrenal glands, as well as numerous retroperitoneal and subcutaneous nodules.  Study is limited in evaluation of the solid organs without intravenous contrast.  Dominant mass in the right middle lobe at the right lung base.  Bibasilar atelectasis.   Electronically Signed   By: Rolm Baptise M.D.   On: 10/13/2014 16:49   Dg Chest 2 View  10/25/2014   CLINICAL DATA:  Pneumonia.  Lung cancer.  EXAM: CHEST  2 VIEW  COMPARISON:  10/23/2014.  FINDINGS: RIGHT upper extremity PICC is present with the tip in the lower SVC at the level of the cavoatrial junction. Cardiomegaly. Elevation of the RIGHT hemidiaphragm with  consolidation of the RIGHT base. This involves the RIGHT middle and RIGHT lower lobes. LEFT lung appears clear. Monitoring leads project over the chest.  Compared to the most recent prior  chest radiograph, there is little if any interval change.  RIGHT peritracheal density probably represents mass effect from the mediastinal mass demonstrated on prior CT 10/11/2014.  a  IMPRESSION: 1. Stable RIGHT upper extremity PICC. 2. RIGHT middle lobe and RIGHT lower lobe collapse/consolidation.   Electronically Signed   By: Dereck Ligas M.D.   On: 10/25/2014 13:04   Dg Chest 2 View  10/23/2014   CLINICAL DATA:  Leukocytosis.  Chest pain.  EXAM: CHEST  2 VIEW  COMPARISON:  10/21/2014  FINDINGS: Stable right PICC. Stable volume loss at the right base associated with atelectasis versus airspace disease. Mild linear atelectasis at the left base. No pneumothorax. Increasing right peritracheal density is of unknown significance.  IMPRESSION: Right basilar atelectasis versus airspace disease.  Increasing right paratracheal density which may represent collapsed upper lobe. Attention on follow-up.   Electronically Signed   By: Maryclare Bean M.D.   On: 10/23/2014 12:16   Dg Chest 2 View  10/21/2014   CLINICAL DATA:  Shortness of breath, history of lung cancer  EXAM: CHEST  2 VIEW  COMPARISON:  11/5/ 15  FINDINGS: Cardiomediastinal silhouette is stable. Elevation of the right hemidiaphragm again noted. Stable nodular lesion right base. Again noted right base atelectasis or infiltrate. No pulmonary edema. Mild degenerative changes thoracic spine. Mild left basilar atelectasis.  IMPRESSION: Again noted nodular lesion right base. Elevation of the right hemidiaphragm. Stable right basilar atelectasis or infiltrate. No pulmonary edema.   Electronically Signed   By: Lahoma Crocker M.D.   On: 10/21/2014 14:17   Dg Chest 2 View  10/11/2014   CLINICAL DATA:  Chest pain  EXAM: CHEST  2 VIEW  COMPARISON:  CT from earlier in the same day   FINDINGS: Cardiac show remains elevated with right basilar atelectasis. A dominant right pulmonary nodule is noted as well as some smaller nodules similar to that seen on the prior CT examination. No sizable effusion is noted. No bony abnormality is seen.  IMPRESSION: Right basilar atelectasis with evidence of multiple pulmonary nodules.   Electronically Signed   By: Inez Catalina M.D.   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 Now with shortness of breath. History of DVT.  EXAM: CT ANGIOGRAPHY CHEST WITH CONTRAST  TECHNIQUE: Multidetector CT imaging of the chest was performed using the standard protocol during bolus administration of intravenous contrast. Multiplanar CT image reconstructions and MIPs were obtained to evaluate the vascular anatomy.  CONTRAST:  57mL OMNIPAQUE IOHEXOL 350 MG/ML SOLN  COMPARISON:  CT of the chest without contrast on 08/12/2014  FINDINGS: There has been significant progression of metastatic lung carcinoma in the chest and visualized upper abdomen. The dominant right middle lobe mass now measures approximately 2.9 x 3.6 cm compared to approximately 2.0 x 2.2 cm on the prior study. A number of metastatic pulmonary nodules are identified in both lungs. Anterior right upper lobe nodule measures 1.2 cm. Superior segment right lower lobe nodule measures 0.5 cm. Nodule abutting the lower major fissure measures 1.3 cm. Right lower lobe peripheral nodule measures 0.7 cm. Several other smaller right lower lobe nodules present. There are at least 6 new pulmonary nodules in the left lower lobe with the largest measuring 1.1 cm. Stable 1.6 cm ground-glass opacity at the right lung apex.  Metastatic adenopathy in the  mediastinum also shows significant progression since the prior CT. The dominant anterior mediastinal  lymph node mass abutting both the ascending thoracic aorta and SVC now measures approximately 3.2 x 4.7 cm as compared to roughly 2.5 cm in short axis on the previous exam. Largest right peritracheal lymph node measures 1.6 cm in short axis. Subcarinal lymph node mass measures 2.6 x 3.5 cm. Prevascular lymph node mass anterior to the aortic arch shows enlargement and measures 1.5 cm in short axis. Bilateral axillary lymph nodes also show enlargement with the largest left axillary lymph node measuring 2.3 cm in short axis and the largest right axillary lymph node measuring 1.6 cm in short axis.  Multiple metastatic subcutaneous nodules are identified and are completely new since the prior examination. The largest subcutaneous nodule is in the right anterior lower costal subcutaneous fat and measures 2.8 cm in diameter. This was not present previously. A number of other smaller subcutaneous nodules are present in the left anterior upper abdominal wall fat, fat abutting the chest wall laterally on the left and the subcutaneous fat overlying the left scapula.  The visualized upper abdomen also shows evidence of bilateral adrenal metastasis which were visualized previously. The dominant right adrenal metastasis now measures approximately 3.4 x 3.6 cm compared to 2.7 cm in diameter on the prior study. One or 2 separate left adrenal metastases measure in conglomerate dimensions approximately 2.5 x 3.8 cm. New 1.2 cm nodule posterior to the left upper kidney in the perinephric fat is consistent with metastasis.  No evidence of pulmonary embolism. The thoracic aorta is normal in caliber and shows no evidence of dissection. Diffuse spondylosis of the thoracic spine present. There are no visualized bone metastases in the chest. No pleural effusions or pulmonary infiltrates. There is a small amount of pericardial fluid which appears more prominent compared to the prior CT.  Review of the MIP images confirms the above findings.   IMPRESSION: Marked progression of metastatic disease in the chest and upper abdomen, as detailed above. There is enlargement of the dominant right middle lobe lung carcinoma an a number of new metastatic nodules in both lungs. Metastatic adenopathy in the mediastinum and both axillary regions has increased in size since the prior CT. 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 There are a number of new subcutaneous metastatic nodules in the subcutaneous fat. New posterior left perinephric metastatic nodules also visualized. The patient presumably has not received any interval treatment since the prior CT. Recommend oncologic consultation. A formal PET scan would also be helpful to complete staging evaluation prior to initiation of any cancer treatment. There is no evidence of pulmonary embolism.  Findings were communicated directly to Dr. Canary Brim in the Emergency Department.   Electronically Signed   By: Aletta Edouard M.D.   On: 10/11/2014 14:30   Mr Jeri Cos LO Contrast  10/11/2014   CLINICAL DATA:  Initial encounter. New diagnosis lung cancer. Right-sided headache, 3 days duration. Staging.  EXAM: MRI HEAD WITHOUT AND WITH CONTRAST  TECHNIQUE: Multiplanar, multiecho pulse sequences of the brain and surrounding structures were obtained without and with intravenous contrast.  CONTRAST:  60mL MULTIHANCE GADOBENATE DIMEGLUMINE 529 MG/ML IV SOLN  COMPARISON:  None.  FINDINGS: There are 3 metastatic lesions noted affecting the brain. Within the right lateral cerebellum, there is a 7 mm metastasis. This contains some petechial blood products. At the right parietal vertex, there is a 9 mm metastasis. This contains minimal petechial blood products. In the left posterior parietal  lobe, there is a 5-6 mm metastasis.  No evidence of ischemic infarction. No evidence of chronic small vessel disease. No hydrocephalus. No extra-axial fluid collection. No calvarial lesion is seen.  In the right orbit, there is a  well-circumscribed abnormality measuring approximately 1 cm in size. This probably represents a venous varix. I cannot be certain this does not represent a metastasis to the extra-ocular muscles (superior rectus).  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 Right cerebellum. Right parietal vertex. Left posterior parietal region. Early petechial blood products in the right cerebellar and right parietal lesions.  Orbital lesion on the right associated with the superior rectus muscle. This could represent a venous varix or metastasis. This could be studied with CT or dedicated orbital MRI should differentiation be important.   Electronically Signed   By: Nelson Chimes M.D.   On: 10/11/2014 19:36   Dg Chest Port 1 View  10/15/2014   CLINICAL DATA:  Dyspnea.  EXAM: PORTABLE CHEST - 1 VIEW  COMPARISON:  10/11/2014  FINDINGS: The cardiac silhouette remains enlarged. There is persistent elevation of right hemidiaphragm, not significantly changed. Right-sided lung nodules are again seen, measuring approximately 3.9 cm in the right base and 1.3 cm adjacent to the right hilum. Focal linear opacity in the lateral left mid lung is unchanged, likely platelike atelectasis. Right basilar atelectasis is again seen. No large pleural effusion is identified. No pneumothorax.  IMPRESSION: Stable appearance of the chest with right lung nodules/mass and bibasilar atelectasis.   Electronically Signed   By: Logan Bores   On: 10/15/2014 09:31    Microbiology: Recent Results (from the past 240 hour(s))  Urine culture     Status: None   Collection Time: 10/21/14  1:36 PM  Result Value Ref Range Status   Specimen Description URINE, RANDOM  Final   Special Requests NONE  Final   Culture  Setup Time   Final    10/21/2014 21:41 Performed at [facility] Performed at [facility]   Final   Culture NO GROWTH Performed at [facility]   Final    Report Status 10/22/2014 FINAL  Final     Labs: Basic Metabolic Panel:  Recent Labs Lab 10/21/14 0505 10/22/14 0505 10/24/14 0405 10/25/14 0409 10/26/14 0420  NA 134* 130* 127* 130* 131*  K 4.4 4.7 5.2 5.3 5.2  CL 96 93* 91* 94* 94*  CO2 30 27 26 26 26   GLUCOSE 122* 145* 251* 227* 158*  BUN 12 14 21 21 17   CREATININE 0.86 0.88 1.05 1.02 0.89  CALCIUM 9.3 9.2 8.8 8.6 8.8   Liver Function Tests: No results for input(s): AST, ALT, ALKPHOS, BILITOT, PROT, ALBUMIN in the last 168 hours. No results for input(s): LIPASE, AMYLASE in the last 168 hours. No results for input(s): AMMONIA in the last 168 hours. CBC:  Recent Labs Lab 10/22/14 0505 10/23/14 0510 10/24/14 0405 10/25/14 0409 10/26/14 0420  WBC 18.5* 20.7* 16.4* 8.3 5.3  HGB 11.2* 10.4* 9.4* 9.2* 9.1*  HCT 32.9* 31.7* 28.2* 27.5* 27.4*  MCV 70.1* 71.4* 71.6* 70.2* 71.2*  PLT 90* 80* 74* 86* 64*   Cardiac Enzymes: No results for input(s): CKTOTAL, CKMB, CKMBINDEX, TROPONINI in the last 168 hours. BNP: BNP (last 3 results)  Recent Labs  10/15/14 0514  PROBNP 561.6*   CBG:  Recent Labs Lab 10/25/14 1142 10/25/14 1652 10/25/14 2059 10/26/14 0733 10/26/14 1156  GLUCAP 205* 190*  193* 199* 197*       Signed:  Lelon Frohlich  [facility] Pager: [phone] 10/26/2014, 4:31 PM

## 2014-10-26 NOTE — Progress Notes (Signed)
[place] referral received from Dessa Phi, RN case manager for [facility] SN services. I went to discuss home care with Ms. Rosales and she stated she would be leaving in the morning to go to [place] with her daughter, Inda Merlin. I contacted Jamella at [phone] to confirm that the patient would be leaving in the morning to move to [place] with her and she confirmed that is the correct plan. [facility] will not be able to provide [facility] services for this patient since she is outside of our service area.   Kristen Hayworth 10/26/2014, 1:50 PM

## 2014-10-26 NOTE — Plan of Care (Signed)
Problem: Phase III Progression Outcomes Goal: Discharge plan remains appropriate-arrangements made Outcome: Completed/Met Date Met:  10/26/14 Goal: Other Phase III Outcomes/Goals Outcome: Not Applicable Date Met:  84/66/59

## 2014-10-26 NOTE — Progress Notes (Signed)
RX all given to patinet.

## 2014-10-27 ENCOUNTER — Encounter

## 2014-10-27 NOTE — Telephone Encounter (Signed)
Dr. Lamonte Sakai, Patient has been discharged from the hospital.  She is moving to [facility] to be with her family.  Does anything need to be done? Please advise.

## 2014-10-27 NOTE — Telephone Encounter (Signed)
Sabrina Adkins, pt was discharged from hospital yesterday 11/16 but her discharge does state that she will be relocating to [place] to be with family. Will forward to [facility]

## 2014-10-30 NOTE — Telephone Encounter (Signed)
I dont believe so

## 2014-10-31 ENCOUNTER — Encounter: Primary: Internal Medicine

## 2014-10-31 ENCOUNTER — Encounter: Admit: 2014-11-01 | Primary: Internal Medicine

## 2014-10-31 NOTE — ED Notes (Signed)
Bed: 02-01  Expected date:   Expected time:   Means of arrival:   Comments:  squad

## 2014-10-31 NOTE — ED Notes (Signed)
Pt medicated, see MAR.  X ray at bedside.  Will continue to monitor pt.    Glennis Brink, RN  10/31/14 2212

## 2014-10-31 NOTE — ED Provider Notes (Signed)
[facility]  eMERGENCY dEPARTMENT eNCOUnter        Pt Name: Valerie Thompson  MRN: [id number]  [place] Feb 18, 1961  Date of evaluation: 10/31/2014  Provider: Dorian Heckle, CNP  PCP: Darrick Huntsman, MD  ED Attending: No att. providers found    [place]       Chief Complaint   Patient presents with   ??? Shortness of Breath     pt brought in via squad for SOB, pt's family called for low blood sugar--squad gave glucose tab and rechecked FSBS 74.  pt stage 4 lung cancer.  pt drove up 7 hrs from [place] [employer] on Tuesday.       HISTORY OF PRESENT ILLNESS   (Location/Symptom, Timing/Onset, Context/Setting, Quality, Duration, Modifying Factors, Severity)  Note limiting factors.     Valerie Thompson is a 53 y.o. female who presents today via EMS with having low blood sugars in the 30's. Daughter states she called oncall MD and was told to give patient concentrated sugar and feed the patient. She states that she gave the patient food, however, her blood sugar only come up into the 50's. Patient also has stage four lung cancer with mets to her abdomen and brain. 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 Patient had last chemo Wednesday the 11th, then patient was brought home due to the cancer from [place]. Patient has appointment scheduled next Tuesday at [facility] which is who is to manage patient's cancer care.  Patient's daughter report that the patient has multiple sores developing on her scan however, she was told that these were related to the cancer.  The patient reports generalized pain and feeling uncomfortable.  She denies pain with inhalation however she had a recent car ride a 7 hours from [place] last Tuesday.  Patient is alert and answering questions however, she doesn't appear to be in acute discomfort and mild distress.    Nursing Notes were all reviewed and agreed with or any disagreements were addressed  in the HPI.    REVIEW OF SYSTEMS    (2-9  systems for level 4, 10 or more for level 5)     Review of Systems   Constitutional: Negative for fever and chills.   HENT: Negative for congestion and sore throat.    Eyes: Positive for pain and visual disturbance. Negative for discharge and redness.        Patient complains of pain behind the right eye as her daughter reports she has a tumor secondary to her stage IV cancer.  She reports photophobia and decreased vision in the right eye.   Respiratory: Positive for shortness of breath. Negative for cough and wheezing.    Cardiovascular: Negative for chest pain.   Gastrointestinal: Positive for nausea. Negative for vomiting, abdominal pain and diarrhea.        Patient complains of feeling nauseated however she denies any vomiting or diarrhea.   Genitourinary: Negative for dysuria, urgency, frequency and hematuria.   Musculoskeletal: Positive for myalgias and arthralgias. Negative for back pain.        He complains of allover body aches and has swelling in her bilateral lower extremities from her knees down.  She reports discomfort with moving.   Skin: Positive for color change.        Patient has multiple circular 4 cm ecchymotic nodules on her body specifically on the left shoulder and right arm and her daughter reports in her period area.  Daughter informed that this was related to the cancer.   Neurological: Positive for weakness. Negative for dizziness, numbness and headaches.        Patient reports feeling generally weak however, she did just travel 7 hours on Tuesday from [place] to here.   Psychiatric/Behavioral: Negative for agitation.         PAST MEDICAL HISTORY     Past Medical History   Diagnosis Date   ??? Lung cancer ([place])    ??? Acute respiratory failure with hypercapnia ([place])    ??? Diabetes mellitus ([place])    ??? Hypertension    ??? Leukocytosis    ??? Metastatic disease ([place])    ??? Nausea and vomiting    ??? PAF (paroxysmal atrial fibrillation) ([facility])    ??? Pedal edema    ??? SVT (supraventricular tachycardia)     ??? UTI (urinary tract infection)    ??? VT (ventricular tachycardia) ([place])    ??? DVT (deep vein thrombosis) in pregnancy    ??? Factor 5 Leiden mutation, heterozygous [facility])          SURGICAL HISTORY       Past Surgical History   Procedure Laterality Date   ??? Lung biopsy     ??? Cholecystectomy     ??? Other surgical history       uterine ablation         CURRENT MEDICATIONS       Previous Medications    ACETAMINOPHEN (TYLENOL) 325 MG TABLET    Take 650 mg by mouth every 6 hours as needed for Pain    AMIODARONE (CORDARONE) 200 MG TABLET    Take 200 mg by mouth daily    DIAZEPAM (VALIUM) 5 MG TABLET    Take 5 mg by mouth every 12 hours    DOCUSATE SODIUM (COLACE) 100 MG CAPSULE    Take 100 mg by mouth 2 times daily    DOXYCYCLINE (VIBRA-TABS) 100 MG TABLET    Take 100 mg by mouth 2 times daily    GABAPENTIN (NEURONTIN) 100 MG CAPSULE    Take 100 mg by mouth 3 times daily    GLIPIZIDE (GLUCOTROL) 5 MG TABLET    Take 5 mg by mouth 2 times daily (before meals)    HYDROCHLOROTHIAZIDE (HYDRODIURIL) 25 MG TABLET    Take 1 tablet by mouth daily.    INDOMETHACIN (INDOCIN) 50 MG CAPSULE    Take 1 capsule by mouth 2 times daily (with meals).    INSULIN GLARGINE (LANTUS) 100 UNIT/ML INJECTION VIAL    Inject 35 Units into the skin nightly    LORAZEPAM (ATIVAN) 0.5 MG TABLET    Take 0.5 mg by mouth every 12 hours as needed for Anxiety    METFORMIN (GLUCOPHAGE) 500 MG TABLET    Take 2 tablets by mouth 2 times daily (with meals).    MULTIPLE VITAMINS-MINERALS (THERAPEUTIC MULTIVITAMIN-MINERALS) TABLET    Take 1 tablet by mouth daily    OXYCODONE (OXY-IR) 15 MG IMMEDIATE RELEASE TABLET    Take 10 mg by mouth every 6 hours as needed for Pain    PANTOPRAZOLE SODIUM (PROTONIX) 40 MG PACK PACKET    Take 40 mg by mouth every morning (before breakfast)    PIOGLITAZONE (ACTOS) 30 MG TABLET    Take 30 mg by mouth daily    POLYETHYLENE GLYCOL (GLYCOLAX) POWDER    Take 17 g by mouth daily    QUETIAPINE (SEROQUEL) 50 MG TABLET    Take  50 mg by mouth 2  times daily         ALLERGIES     Latex; Aspirin; Ibuprofen; Iodine; and Orange juice    FAMILY HISTORY     History reviewed. No pertinent family history.       SOCIAL HISTORY       History     Social History   ??? Marital Status: Married     Spouse Name: N/A     Number of Children: N/A   ??? Years of Education: N/A     Social History Main Topics   ??? Smoking status: Former Smoker   ??? Smokeless tobacco: None   ??? Alcohol Use: No   ??? Drug Use: No   ??? Sexual Activity: None     Other Topics Concern   ??? None     Social History Narrative   ??? None       SCREENINGS             PHYSICAL EXAM    (up to 7 for level 4, 8 or more for level 5)   ED Triage Vitals   BP Temp Temp Source Pulse Resp SpO2 Height Weight   10/31/14 2118 10/31/14 2118 10/31/14 2118 10/31/14 2118 10/31/14 2118 10/31/14 2118 10/31/14 2118 10/31/14 2118   117/57 mmHg 97.6 ??F (36.4 ??C) Oral 106 20 100 % 5\' 4"  (1.626 m) 253 lb (114.76 kg)       Physical Exam   Constitutional: She is oriented to person, place, and time. She appears well-developed and well-nourished.   HENT:   Head: Normocephalic.   Right Ear: External ear normal.   Left Ear: External ear normal.   Mouth/Throat: Oropharynx is clear and moist.   Eyes: Right eye exhibits no discharge. Left eye exhibits no discharge. No scleral icterus.   Patient's right eye is swollen and difficult to examine due to the pain that she is having an inability to retract the eyelids due to the swelling.  Her left eye has normal extraocular movement with no nystagmus.    Neck: Normal range of motion. Neck supple. No tracheal deviation present.   Cardiovascular: Normal heart sounds and intact distal pulses.    Patient is tachycardic with a rate of 106 bpm on the monitor.  She has normal S1 and S2.  She is also hypotensive.  However, her peripheral pulses are 2+ in her wrist however, bilateral pedal pulses are 1+ secondary to the extensive 2+ pitting edema in her lower extremities from her knee down.   Pulmonary/Chest: Effort  normal and breath sounds normal. No respiratory distress.   Airway is pain with symmetric rise and fall of chest lungs are clear throughout in her upper lobes however bilateral lips are diminished in the bases.  Complains of feeling shortness of breath at home however she is not tachypneic.   Abdominal: Soft. Bowel sounds are normal. There is no tenderness. There is no guarding.   Abdomen is grossly protuberant with a large pannus.  However bowel sounds are positive.   Musculoskeletal: Normal range of motion.   Patient has normal power of her upper extremities however, the lower extremities are weak.  She is able to move about the bed with assistance from a lying to sitting position.   Lymphadenopathy:     She has no cervical adenopathy.   Neurological: She is alert and oriented to person, place, and time.   Patient is alert and answering questions appropriately.  She follows commands  without difficulty.  She has normal power in her upper extremities however she is weak in her lower extremities I suspect this is related to the extensive swelling she has from her knees down.  Tongue is midline with no facial drooping   Skin: Skin is warm. She is not diaphoretic. No pallor.   Patient has multiple circular 4 cm ecchymotic nodules on her body specifically on the left shoulder and right arm and her daughter reports in her peri-area.   Psychiatric: She has a normal mood and affect. Her behavior is normal.   Nursing note and vitals reviewed.      DIAGNOSTIC RESULTS   LABS:    Labs Reviewed   CBC WITH AUTO DIFFERENTIAL - Abnormal; Notable for the following:     WBC 27.0 (*)     Hemoglobin 9.6 (*)     Hematocrit 30.8 (*)     MCV 74.0 (*)     MCH 23.2 (*)     Neutrophils Absolute 19.7 (*)     Monocytes Absolute 3.2 (*)     All other components within normal limits   BRAIN NATRIURETIC PEPTIDE - Abnormal; Notable for the following:     Pro-BNP 850 (*)     All other components within normal limits   COMPREHENSIVE METABOLIC PANEL  - Abnormal; Notable for the following:     Sodium 134 (*)     Potassium 6.1 (*)     Chloride 98 (*)     Glucose 68 (*)     BUN 25 (*)     GFR Non-African American 52 (*)     Calcium 11.0 (*)     Total Protein 5.2 (*)     Alb 2.8 (*)     All other components within normal limits    Narrative:     CALL  [place] tel. [phone],  Chemistry results called to and read back by Jeanella Flattery RN, 10/31/2014  22:53, by Karlyne Greenspan   URINALYSIS - Abnormal; Notable for the following:     Clarity, UA TURBID (*)     Blood, Urine LARGE (*)     Protein, UA TRACE (*)     All other components within normal limits   PROTIME-INR - Abnormal; Notable for the following:     Protime 14.7 (*)     INR 1.28 (*)     All other components within normal limits   MICROSCOPIC URINALYSIS - Abnormal; Notable for the following:     RBC, UA 3-5 (*)     All other components within normal limits   POTASSIUM - Abnormal; Notable for the following:     Potassium 6.1 (*)     All other components within normal limits    Narrative:     CALL  [facility] tel. [phone],  Chemistry results called to and read back by Gillian Scarce RN, 11/01/2014  00:47, by [facility]   POCT VENOUS - Abnormal; Notable for the following:     Lactate 4.27 (*)     All other components within normal limits   CULTURE BLOOD #1   CULTURE BLOOD #2   POCT GLUCOSE   POCT LACTIC ACID (LACTATE)       All other labs were within normal range or not returned as of this dictation.    EKG: All EKG's are interpreted by the Emergency Department Physician who either signs or Co-signs this chart in the absence of a cardiologist.    EKG: EKG interpreted  by myself and my attending physician Dr. Gordy Levan.  EKG shows sinus tachycardia with a rate of 106 bpm.  There is no ST elevation or depression.    RADIOLOGY:   Non-plain film images such as CT, Ultrasound and MRI are read by the radiologist. Plain radiographic images are visualized and preliminarily interpreted by the  ED Provider with the below  findings:    Interpretation per the Radiologist below, if available at the time of this note:    XR CHEST PORTABLE    Final Result: IMPRESSION:     Opacification of the inferior right hemithorax as above.  If there are prior    studies, comparison would be helpful.         No results found.    PROCEDURES   Unless otherwise noted below, none     Procedures     Ultrasound Guided Peripheral Intravenous Access:    After verbal consent from the patient, the left arm was placed in appropriate position. Skin was prepped with chlorhexidine. Using the IV access ultrasound probe the basilic vein was visualized and with one insertion, a 20 French 1 3/4 inch catheter was inserted. Immediate blood return was visualized. The needle was then retracted and IV tubing was connected and flushed the IV with 3mL of normal saline. The IV flushed without difficulty and patient tolerated well. The IV was secured with Tegaderm and tape. Patient tolerated procedure well with no complications.       CRITICAL CARE TIME     Because of high probability of sudden clinical deterioration of the patient's condition risk of further deterioration, critical care time required my full attention to the patient's condition. As this included chart data review, documentation, medication orderring, reviewing the patient's old records, reevaluation of the patient's cardiac, pulmonary and neurological status. Frequent repeat and reevaluation of vital signs. Consultations with my ED attending physician and admitting physician. Ordering, interpreting and reviewing diagnostic testing. Therefore, a critical care time was 40 minutes of direct attention to the patient's condition did not include time spent on procedures.      CONSULTS:  IP CONSULT TO CASE MANAGEMENT  IP CONSULT TO SPIRITUAL SERVICES    EMERGENCY DEPARTMENT COURSE and DIFFERENTIAL DIAGNOSIS/MDM:   Vitals:    Filed Vitals:    10/31/14 2231 10/31/14 2331 11/01/14 0038 11/01/14 0131   BP: 80/45 109/62  121/44 98/63   Pulse: 103 105 100 103   Temp:       TempSrc:       Resp:   20 21   Height:       Weight:       SpO2:   99% 94%       MDM    Patient was given the following medications:  Medications   HYDROmorphone (DILAUDID) injection 1 mg (not administered)   HYDROmorphone (DILAUDID) injection 1 mg (1 mg Intravenous Given 10/31/14 2211)   ondansetron (ZOFRAN) injection 4 mg (4 mg Intravenous Given 10/31/14 2212)   0.9 % sodium chloride bolus (0 mLs Intravenous Stopped 10/31/14 2349)   vancomycin (VANCOCIN) 1000 mg in dextrose 5% 200 mL IVPB (0 mg Intravenous Stopped 11/01/14 0131)   piperacillin-tazobactam (ZOSYN) IVPB 3.375 g (0 g Intravenous Stopped 11/01/14 0033)   HYDROmorphone (DILAUDID) injection 1 mg (1 mg Intravenous Given 11/01/14 0032)   insulin regular (HUMULIN R;NOVOLIN R) injection 10 Units (10 Units Intravenous Given 11/01/14 0110)   dextrose 50 % solution 50 mL (50 mLs Intravenous Given 11/01/14 0110)  sodium bicarbonate 8.4 % injection 50 mEq (50 mEq Intravenous Given 11/01/14 0110)     Patient presents today after traveling with her daughter from [place] to here on Tuesday.  Patient was recently diagnosed with stage IV lung cancer with metastases to the abdomen and brain.  However, the patient is also diabetic and her daughter took her blood sugar today it was in the 30s.  She called the doctor on-call and was told to give concentrated sugar and see the patient which only brought her sugar up into the mid 50s. The daughter then called the ambulance as the patient developing some shortness of breath and not feeling well. Patient presents today alert and answering questions however, she is very uncomfortable.    After evaluation examination the patient I'm very concerned that the patient probably has pneumonia, septicemia and potentially a urinary tract infection; in addition, to her stage IV metastatic lung cancer.  Her hypoglycemia was resolved with her blood sugar presenting to the ER in  the 70s.  However, she was tachycardic and hypotensive to which I ordered a significant amount of blood work, chest x-ray, IV fluids, urinalysis, Foley catheter and EKG. due to her mild dyspnea and diminished breath sounds IV antibiotics were ordered empirically due to the elevation in her lactic acid and probable pneumonia.  In addition, due to her acute pain she was given several doses of Dilaudid for pain. Her POC lactic acid is elevated at 4.27. Complete metabolic panel shows acute septicemia with 27,000 WBCs.  Patient does have bilateral lower extremity swelling from the knees down her BNP is 850 however, due to her recent move from [place] to [place] I am unable to compare her most recent laboratory values, as her last values are from several years ago.Patient's complete metabolic panel shows hyperkalemia of 6.1.  I was initially concerned for a hemolyzed blood therefore, after 1 L of fluid,  the potassium was rechecked and remained 6.1. Therefore, patient was ordered and given 10 units of insulin, sodium bicarb and an amp of D50.  Patient is hypercalcemic however this is more likely related to her stage IV cancer. Her chest x-ray shows opacification of the inferior 1/3 to 1/2 of the right hemithorax that may represent a combination of airspace disease, mass and/or pleural fluid. There is a small band of atelectasis in the lingula. Left lung is otherwise grossly clear. Study was obtained at low lung volumes. No evidence of a pneumothorax. However, due to the patient's acuity and illness she will be admitted to the hospital.      I did talk to the family about medical expectations and end-of-life care however, the daughter informs that they have not had that conversation since patient moved up, back to [place] on Tuesday.  I informed the daughter and the patient that the patient will be admitted to the hospital for further medical care and oncology consultation.  Therefore, I placed a call to the admitting  hospitalist.  I did not consult oncology as the patient has not seen an oncologist in the state of [place], as she has an appointment at [facility] on Tuesday.  At 140: I spoke with the admitting MD, Dr. Moreen Fowler, who agreed to admit the patient and assume care of the patient.    Overall, the patient tolerated their visit well.  They were seen and evaluated by the attending physician who agreed with the assessment and plan.  The patient and her family were informed  of the results of any tests, a time was given to answer questions, a plan was proposed and they agreed with plan.  Therefore, the patient will be admitted to the hospitalist and under further care and management.  She tolerated her emergency room visit as well as possible due to her acute condition, however she remained alert and talking with staff during my care.      FINAL IMPRESSION      1. Metastatic lung cancer (metastasis from lung to other site), unspecified laterality ([place])    2. Hypoglycemia    3. Hyperkalemia    4. Sepsis, due to unspecified organism ([place])          DISPOSITION/PLAN   DISPOSITION     PATIENT REFERRED TO:  No follow-up provider specified.    DISCHARGE MEDICATIONS:  New Prescriptions    No medications on file       DISCONTINUED MEDICATIONS:  Discontinued Medications    No medications on file              (Please note that portions of this note were completed with a voice recognition program.  Efforts were made to edit the dictations but occasionally words are mis-transcribed.)    Dorian Heckle, CNP (electronically signed)        Michel Santee Jaivion Kingsley, CNP  11/01/14 1511

## 2014-11-01 ENCOUNTER — Inpatient Hospital Stay
Admit: 2014-11-01 | Discharge: 2014-11-06 | Disposition: A | Discharge status: Hospice | Source: Home / Self Care | Admitting: Internal Medicine

## 2014-11-01 ENCOUNTER — Inpatient Hospital Stay: Primary: Internal Medicine

## 2014-11-01 DIAGNOSIS — C3490 Malignant neoplasm of unspecified part of unspecified bronchus or lung: Principal | ICD-10-CM

## 2014-11-01 LAB — CBC WITH AUTO DIFFERENTIAL
Atypical Lymphocytes Relative: 0 % (ref 0–6)
Bands Relative: 6 % (ref 0–7)
Basophils %: 0.3 %
Basophils Absolute: 0 10*3/uL (ref 0.0–0.2)
Eosinophils %: 1 %
Eosinophils Absolute: 0.3 10*3/uL (ref 0.0–0.6)
Hematocrit: 30.8 % — ABNORMAL LOW (ref 36.0–48.0)
Hemoglobin: 9.6 g/dL — ABNORMAL LOW (ref 12.0–16.0)
Lymphocytes %: 14 %
Lymphocytes Absolute: 3.8 10*3/uL (ref 1.0–5.1)
MCH: 23.2 pg — ABNORMAL LOW (ref 26.0–34.0)
MCHC: 31.3 g/dL (ref 31.0–36.0)
MCV: 74 fL — ABNORMAL LOW (ref 80.0–100.0)
MPV: 10 fL (ref 5.0–10.5)
Monocytes %: 12 %
Monocytes Absolute: 3.2 10*3/uL — ABNORMAL HIGH (ref 0.0–1.3)
Neutrophils %: 67 %
Neutrophils Absolute: 19.7 10*3/uL — ABNORMAL HIGH (ref 1.7–7.7)
PLATELET SLIDE REVIEW: DECREASED
Platelets: 147 10*3/uL (ref 135–450)
RBC: 4.16 M/uL (ref 4.00–5.20)
RDW: 14 % (ref 12.4–15.4)
WBC: 27 10*3/uL — ABNORMAL HIGH (ref 4.0–11.0)

## 2014-11-01 LAB — POTASSIUM
Potassium: 6.1 mmol/L (ref 3.5–5.1)
Potassium: 6 mmol/L (ref 3.5–5.1)

## 2014-11-01 LAB — POCT GLUCOSE
POC Glucose: 100 mg/dl — ABNORMAL HIGH (ref 70–99)
POC Glucose: 44 mg/dl — CL (ref 70–99)
POC Glucose: 51 mg/dl — ABNORMAL LOW (ref 70–99)
POC Glucose: 57 mg/dl — ABNORMAL LOW (ref 70–99)
POC Glucose: 59 mg/dl — ABNORMAL LOW (ref 70–99)
POC Glucose: 74 mg/dl (ref 70–99)
POC Glucose: 76 mg/dl (ref 70–99)
POC Glucose: 78 mg/dl (ref 70–99)
POC Glucose: 90 mg/dl (ref 70–99)
POC Glucose: 93 mg/dl (ref 70–99)
POC Glucose: 99 mg/dl (ref 70–99)

## 2014-11-01 LAB — COMPREHENSIVE METABOLIC PANEL
ALT: 12 U/L (ref 10–40)
AST: 32 U/L (ref 15–37)
Albumin/Globulin Ratio: 1.2 (ref 1.1–2.2)
Albumin: 2.8 g/dL — ABNORMAL LOW (ref 3.4–5.0)
Alkaline Phosphatase: 97 U/L (ref 40–129)
Anion Gap: 15 (ref 3–16)
BUN: 25 mg/dL — ABNORMAL HIGH (ref 7–20)
CO2: 21 mmol/L (ref 21–32)
Calcium: 11 mg/dL — ABNORMAL HIGH (ref 8.3–10.6)
Chloride: 98 mmol/L — ABNORMAL LOW (ref 99–110)
Creatinine: 1.1 mg/dL (ref 0.6–1.1)
GFR African American: >60 (ref >60)
GFR Non-African American: 52 — AB (ref >60)
Globulin: 2.4 g/dL
Glucose: 68 mg/dL — ABNORMAL LOW (ref 70–99)
Potassium: 6.1 mmol/L (ref 3.5–5.1)
Sodium: 134 mmol/L — ABNORMAL LOW (ref 136–145)
Total Bilirubin: <0.2 mg/dL (ref 0.0–1.0)
Total Protein: 5.2 g/dL — ABNORMAL LOW (ref 6.4–8.2)

## 2014-11-01 LAB — URINALYSIS
Bilirubin Urine: NEGATIVE
Glucose, Ur: NEGATIVE mg/dL
Ketones, Urine: NEGATIVE mg/dL
Leukocyte Esterase, Urine: NEGATIVE
Nitrite, Urine: NEGATIVE
Specific Gravity, UA: 1.014 (ref 1.005–1.030)
Urobilinogen, Urine: 0.2 E.U./dL (ref <2.0)
pH, UA: 5 (ref 5.0–8.0)

## 2014-11-01 LAB — POCT VENOUS
Lactate: 4.27 mmol/L (ref 0.40–2.00)
POC Glucose: 54 mg/dl — ABNORMAL LOW (ref 70–99)
POC Potassium: 5.8 mEq/L — ABNORMAL HIGH (ref 3.5–5.1)

## 2014-11-01 LAB — PROTIME-INR
INR: 1.28 — ABNORMAL HIGH (ref 0.85–1.16)
Protime: 14.7 s — ABNORMAL HIGH (ref 9.8–13.0)

## 2014-11-01 LAB — MICROSCOPIC URINALYSIS
Epithelial Cells, UA: 3 /HPF (ref 0–5)
Hyaline Casts, UA: 8 /HPF (ref 0–8)
WBC, UA: 5 /HPF (ref 0–5)

## 2014-11-01 LAB — BRAIN NATRIURETIC PEPTIDE: Pro-BNP: 850 pg/mL — ABNORMAL HIGH (ref 0–124)

## 2014-11-01 MED ORDER — DEXTROSE 50 % IV SOLN
50 % | Freq: Once | INTRAVENOUS | Status: AC
Start: 2014-11-01 — End: 2014-11-01
  Administered 2014-11-01: 06:00:00 50 mL via INTRAVENOUS

## 2014-11-01 MED ORDER — POLYETHYLENE GLYCOL 3350 17 G PO PACK
17 g | Freq: Every day | ORAL | Status: DC | PRN
Start: 2014-11-01 — End: 2014-11-06

## 2014-11-01 MED ORDER — THERAPEUTIC MULTIVIT/MINERAL PO TABS
Freq: Every day | ORAL | Status: DC
Start: 2014-11-01 — End: 2014-11-02
  Administered 2014-11-01: 19:00:00 1 via ORAL

## 2014-11-01 MED ORDER — SODIUM CHLORIDE 0.9 % IV SOLN
0.9 % | INTRAVENOUS | Status: AC
Start: 2014-11-01 — End: 2014-10-31
  Administered 2014-11-01: 04:00:00 1000 via INTRAVENOUS

## 2014-11-01 MED ORDER — ONDANSETRON HCL 4 MG/2ML IJ SOLN
4 MG/2ML | Freq: Once | INTRAMUSCULAR | Status: AC
Start: 2014-11-01 — End: 2014-10-31
  Administered 2014-11-01: 03:00:00 4 mg via INTRAVENOUS

## 2014-11-01 MED ORDER — DOCUSATE SODIUM 100 MG PO CAPS
100 MG | Freq: Two times a day (BID) | ORAL | Status: DC
Start: 2014-11-01 — End: 2014-11-06
  Administered 2014-11-01 – 2014-11-02 (×3): 100 mg via ORAL

## 2014-11-01 MED ORDER — SODIUM POLYSTYRENE SULFONATE 15 GM/60ML PO SUSP
15 GM/60ML | Freq: Once | ORAL | Status: AC
Start: 2014-11-01 — End: 2014-11-01
  Administered 2014-11-01: 22:00:00 15 g via ORAL

## 2014-11-01 MED ORDER — VANCOMYCIN HCL IN DEXTROSE 1-5 GM/200ML-% IV SOLN
1-5 GM/200ML-% | Freq: Once | INTRAVENOUS | Status: AC
Start: 2014-11-01 — End: 2014-11-01
  Administered 2014-11-01: 06:00:00 1000 mg via INTRAVENOUS

## 2014-11-01 MED ORDER — LORAZEPAM 0.5 MG PO TABS
0.5 MG | Freq: Two times a day (BID) | ORAL | Status: DC | PRN
Start: 2014-11-01 — End: 2014-11-03
  Administered 2014-11-02 – 2014-11-03 (×3): 0.5 mg via ORAL

## 2014-11-01 MED ORDER — ENOXAPARIN SODIUM 30 MG/0.3ML SC SOLN
30 MG/0.3ML | Freq: Every day | SUBCUTANEOUS | Status: DC
Start: 2014-11-01 — End: 2014-11-06
  Administered 2014-11-02 – 2014-11-04 (×3): 30 mg via SUBCUTANEOUS

## 2014-11-01 MED ORDER — DIPHENHYDRAMINE HCL 25 MG PO TABS
25 MG | Freq: Once | ORAL | Status: AC
Start: 2014-11-01 — End: 2014-11-02
  Administered 2014-11-02: 11:00:00 50 mg via ORAL

## 2014-11-01 MED ORDER — INDOMETHACIN 25 MG PO CAPS
25 MG | Freq: Two times a day (BID) | ORAL | Status: DC
Start: 2014-11-01 — End: 2014-11-01
  Administered 2014-11-01: 15:00:00 50 mg via ORAL

## 2014-11-01 MED ORDER — DEXTROSE 50 % IV SOLN
50 % | INTRAVENOUS | Status: AC
Start: 2014-11-01 — End: 2014-11-01
  Administered 2014-11-01: 08:00:00

## 2014-11-01 MED ORDER — HYDROMORPHONE HCL 1 MG/ML IJ SOLN
1 MG/ML | Freq: Once | INTRAMUSCULAR | Status: AC
Start: 2014-11-01 — End: 2014-11-01
  Administered 2014-11-01: 06:00:00 1 mg via INTRAVENOUS

## 2014-11-01 MED ORDER — MAGNESIUM HYDROXIDE 400 MG/5ML PO SUSP
400 MG/5ML | Freq: Every day | ORAL | Status: DC | PRN
Start: 2014-11-01 — End: 2014-11-06

## 2014-11-01 MED ORDER — PANTOPRAZOLE SODIUM 40 MG PO TBEC
40 MG | Freq: Every day | ORAL | Status: DC
Start: 2014-11-01 — End: 2014-11-06
  Administered 2014-11-01 – 2014-11-03 (×3): 40 mg via ORAL

## 2014-11-01 MED ORDER — DOXYCYCLINE HYCLATE 100 MG PO CAPS
100 MG | Freq: Two times a day (BID) | ORAL | Status: DC
Start: 2014-11-01 — End: 2014-11-03
  Administered 2014-11-01 – 2014-11-03 (×5): 100 mg via ORAL

## 2014-11-01 MED ORDER — SODIUM BICARBONATE 8.4 % IV SOLN
8.4 % | Freq: Once | INTRAVENOUS | Status: AC
Start: 2014-11-01 — End: 2014-11-01
  Administered 2014-11-01: 06:00:00 50 meq via INTRAVENOUS

## 2014-11-01 MED ORDER — HYDROMORPHONE HCL 1 MG/ML IJ SOLN
1 MG/ML | Freq: Once | INTRAMUSCULAR | Status: AC
Start: 2014-11-01 — End: 2014-11-01
  Administered 2014-11-01: 08:00:00 1 mg via INTRAVENOUS

## 2014-11-01 MED ORDER — NORMAL SALINE FLUSH 0.9 % IV SOLN
0.9 % | Freq: Two times a day (BID) | INTRAVENOUS | Status: DC
Start: 2014-11-01 — End: 2014-11-06
  Administered 2014-11-01 – 2014-11-06 (×7): 10 mL via INTRAVENOUS

## 2014-11-01 MED ORDER — NORMAL SALINE FLUSH 0.9 % IV SOLN
0.9 % | INTRAVENOUS | Status: DC | PRN
Start: 2014-11-01 — End: 2014-11-06

## 2014-11-01 MED ORDER — HYDROMORPHONE HCL 1 MG/ML IJ SOLN
1 MG/ML | Freq: Once | INTRAMUSCULAR | Status: AC
Start: 2014-11-01 — End: 2014-10-31
  Administered 2014-11-01: 03:00:00 1 mg via INTRAVENOUS

## 2014-11-01 MED ORDER — PIPERACILLIN-TAZOBACTAM IN DEX 3-0.375 GM/50ML IV SOLN
Freq: Once | INTRAVENOUS | Status: AC
Start: 2014-11-01 — End: 2014-11-01
  Administered 2014-11-01: 05:00:00 3.375 g via INTRAVENOUS

## 2014-11-01 MED ORDER — ACETAMINOPHEN 325 MG PO TABS
325 MG | Freq: Four times a day (QID) | ORAL | Status: DC | PRN
Start: 2014-11-01 — End: 2014-11-01

## 2014-11-01 MED ORDER — DIAZEPAM 5 MG PO TABS
5 MG | Freq: Two times a day (BID) | ORAL | Status: DC
Start: 2014-11-01 — End: 2014-11-02
  Administered 2014-11-01 – 2014-11-02 (×3): 5 mg via ORAL

## 2014-11-01 MED ORDER — ACETAMINOPHEN 325 MG PO TABS
325 MG | ORAL | Status: DC | PRN
Start: 2014-11-01 — End: 2014-11-04

## 2014-11-01 MED ORDER — AMIODARONE HCL 200 MG PO TABS
200 MG | Freq: Every day | ORAL | Status: DC
Start: 2014-11-01 — End: 2014-11-06
  Administered 2014-11-02 – 2014-11-03 (×2): 200 mg via ORAL

## 2014-11-01 MED ORDER — DEXTROSE 50 % IV SOLN
50 % | INTRAVENOUS | Status: DC | PRN
Start: 2014-11-01 — End: 2014-11-06
  Administered 2014-11-01: 14:00:00 25 g via INTRAVENOUS

## 2014-11-01 MED ORDER — ENOXAPARIN SODIUM 40 MG/0.4ML SC SOLN
40 MG/0.4ML | Freq: Every day | SUBCUTANEOUS | Status: DC
Start: 2014-11-01 — End: 2014-11-01
  Administered 2014-11-01: 15:00:00 40 mg via SUBCUTANEOUS

## 2014-11-01 MED ORDER — PREDNISONE 10 MG PO TABS
10 MG | Freq: Four times a day (QID) | ORAL | Status: AC
Start: 2014-11-01 — End: 2014-11-02
  Administered 2014-11-01 – 2014-11-02 (×3): 50 mg via ORAL

## 2014-11-01 MED ORDER — SODIUM CHLORIDE 0.9 % IV BOLUS
0.9 % | Freq: Once | INTRAVENOUS | Status: AC
Start: 2014-11-01 — End: 2014-10-31

## 2014-11-01 MED ORDER — ONDANSETRON HCL 4 MG/2ML IJ SOLN
4 MG/2ML | Freq: Four times a day (QID) | INTRAMUSCULAR | Status: DC | PRN
Start: 2014-11-01 — End: 2014-11-06

## 2014-11-01 MED ORDER — OXYCODONE HCL 5 MG PO TABS
5 MG | Freq: Four times a day (QID) | ORAL | Status: DC | PRN
Start: 2014-11-01 — End: 2014-11-03
  Administered 2014-11-01 – 2014-11-03 (×6): 10 mg via ORAL

## 2014-11-01 MED ORDER — QUETIAPINE FUMARATE 25 MG PO TABS
25 MG | Freq: Two times a day (BID) | ORAL | Status: DC
Start: 2014-11-01 — End: 2014-11-06
  Administered 2014-11-02 – 2014-11-03 (×3): 50 mg via ORAL

## 2014-11-01 MED ORDER — HYDROCHLOROTHIAZIDE 25 MG PO TABS
25 MG | Freq: Every day | ORAL | Status: DC
Start: 2014-11-01 — End: 2014-11-02
  Administered 2014-11-02: 15:00:00 25 mg via ORAL

## 2014-11-01 MED ORDER — GABAPENTIN 100 MG PO CAPS
100 MG | Freq: Three times a day (TID) | ORAL | Status: DC
Start: 2014-11-01 — End: 2014-11-02
  Administered 2014-11-01 – 2014-11-02 (×4): 100 mg via ORAL

## 2014-11-01 MED ORDER — INSULIN REGULAR HUMAN 100 UNIT/ML IJ SOLN
100 UNIT/ML | Freq: Once | INTRAMUSCULAR | Status: AC
Start: 2014-11-01 — End: 2014-11-01
  Administered 2014-11-01: 06:00:00 10 [IU] via INTRAVENOUS

## 2014-11-01 MED FILL — DOXYCYCLINE HYCLATE 100 MG PO CAPS: 100 MG | ORAL | Qty: 1

## 2014-11-01 MED FILL — LOVENOX 40 MG/0.4ML SC SOLN: 40 MG/0.4ML | SUBCUTANEOUS | Qty: 0.4

## 2014-11-01 MED FILL — HYDROMORPHONE HCL 1 MG/ML IJ SOLN: 1 MG/ML | INTRAMUSCULAR | Qty: 1

## 2014-11-01 MED FILL — SEROQUEL 25 MG PO TABS: 25 MG | ORAL | Qty: 2

## 2014-11-01 MED FILL — VANCOMYCIN HCL IN DEXTROSE 1-5 GM/200ML-% IV SOLN: 1-5 GM/200ML-% | INTRAVENOUS | Qty: 200

## 2014-11-01 MED FILL — ONDANSETRON HCL 4 MG/2ML IJ SOLN: 4 MG/2ML | INTRAMUSCULAR | Qty: 2

## 2014-11-01 MED FILL — SODIUM BICARBONATE 8.4 % IV SOLN: 8.4 % | INTRAVENOUS | Qty: 50

## 2014-11-01 MED FILL — OXYCODONE HCL 5 MG PO TABS: 5 MG | ORAL | Qty: 2

## 2014-11-01 MED FILL — PREDNISONE 10 MG PO TABS: 10 MG | ORAL | Qty: 1

## 2014-11-01 MED FILL — GABAPENTIN 100 MG PO CAPS: 100 MG | ORAL | Qty: 1

## 2014-11-01 MED FILL — DEXTROSE 50 % IV SOLN: 50 % | INTRAVENOUS | Qty: 50

## 2014-11-01 MED FILL — SPS 15 GM/60ML PO SUSP: 15 GM/60ML | ORAL | Qty: 60

## 2014-11-01 MED FILL — INDOMETHACIN 25 MG PO CAPS: 25 MG | ORAL | Qty: 2

## 2014-11-01 MED FILL — DOCUSATE SODIUM 100 MG PO CAPS: 100 MG | ORAL | Qty: 1

## 2014-11-01 MED FILL — SUPER THERA VITE M PO TABS: ORAL | Qty: 1

## 2014-11-01 MED FILL — DIAZEPAM 5 MG PO TABS: 5 MG | ORAL | Qty: 1

## 2014-11-01 MED FILL — PANTOPRAZOLE SODIUM 40 MG PO TBEC: 40 MG | ORAL | Qty: 1

## 2014-11-01 MED FILL — NORMAL SALINE FLUSH 0.9 % IV SOLN: 0.9 % | INTRAVENOUS | Qty: 10

## 2014-11-01 MED FILL — SODIUM CHLORIDE 0.9 % IV SOLN: 0.9 % | INTRAVENOUS | Qty: 1000

## 2014-11-01 MED FILL — HUMULIN R 100 UNIT/ML IJ SOLN: 100 UNIT/ML | INTRAMUSCULAR | Qty: 3

## 2014-11-01 NOTE — ED Notes (Signed)
Pt visualized on monitor per [place].    Glennis Brink, RN  11/01/14 [phone]

## 2014-11-01 NOTE — Progress Notes (Signed)
Pt admitted today AM  CC- SOB  VS reviewed, tachycardic, BP - good control, appears SOB at rest    Labs -elevated potassium, etiology unclear, monitored closely, last at 3 AM 5.8  Re-checked at 2 PM, still elevated  [place], nephrology consult    Hypoglycemia - pt is on multiple meds at home - Metformin, Glipizide, lantus and Actos  All above meds held  BG improving -> 100, SSI - low dose  If develops low BG again will start on D 50    Hx of stage 4 lung CA  Diagnosed and biopsied in [place] in 08/2014  No further information  CXR - opacification of the right hemithorax  Oncology consulted  Will get CTA chest to rule out PE  Would not start on AC doses of Lovenox prophylactically as pt has hx of CA lung stage 4 and unknown whether she has brain mets    Leukocytosis - 27 K  ? R/LL PNA with effusion  On Doxycycline IV, bronchodilator therapy and supplemental O2  Check CBC in AM  Follow blood cultures    Spend 30 minutes on this pt related to her current issues    Rosetta Posner MD

## 2014-11-01 NOTE — Progress Notes (Signed)
Pt hypoglycemic, asymptomatic. 2 juices given and breakfast ordered. MD aware. Will monitor closely.

## 2014-11-01 NOTE — ED Provider Notes (Signed)
I independently performed a history and physical on Valerie Thompson.   All diagnostic, treatment, and disposition decisions were made by myself in conjunction with the mid-level provider.     For further details of [facility] emergency department encounter, please see Valerie Roetting, NP's documentation.    Patient was brought in by family today for low blood sugars.  The patient also has been having difficulty with breathing for the past 24 hours.  The patient does have stage IV lung cancer with metastasis to her abdomen and brain.  She denies any chest pain symptoms.  No vomiting or diarrhea symptoms.  The patient resides in [place] but is scheduled to be seen at the [facility] next Tuesday.    Filed Vitals:    11/01/14 0038 11/01/14 0131 11/01/14 0201 11/01/14 0237   BP: 121/44 98/63 96/81  99/46   Pulse: 100 103 102 100   Temp:       TempSrc:       Resp: 20 21  16    Height:       Weight:       SpO2: 99% 94%  92%     Patient does have mild respiratory distress appreciated on exam.  She does have rhonchorous breath sounds noted throughout.  Dry mucous membranes.  She is tachycardic.  There is no murmur appreciated.  Abdomen is soft and nontender.    Results for orders placed during the hospital encounter of 11/01/14   CBC WITH AUTO DIFFERENTIAL       Result Value Ref Range    WBC 27.0 (*) 4.0 - 11.0 K/uL    RBC 4.16  4.00 - 5.20 M/uL    Hemoglobin 9.6 (*) 12.0 - 16.0 g/dL    Hematocrit 30.8 (*) 36.0 - 48.0 %    MCV 74.0 (*) 80.0 - 100.0 fL    MCH 23.2 (*) 26.0 - 34.0 pg    MCHC 31.3  31.0 - 36.0 g/dL    RDW 14.0  12.4 - 15.4 %    Platelets 147  135 - 450 K/uL    MPV 10.0  5.0 - 10.5 fL    PLATELET SLIDE REVIEW Decreased      SLIDE REVIEW see below      Path Consult Yes      Neutrophils Relative 67.0      Lymphocytes Relative 14.0      Monocytes Relative 12.0      Eosinophils Relative Percent 1.0      Basophils Relative 0.3      Neutrophils Absolute 19.7 (*) 1.7 - 7.7 K/uL    Lymphocytes  Absolute 3.8  1.0 - 5.1 K/uL    Monocytes Absolute 3.2 (*) 0.0 - 1.3 K/uL    Eosinophils Absolute 0.3  0.0 - 0.6 K/uL    Basophils Absolute 0.0  0.0 - 0.2 K/uL    Bands Relative 6  0 - 7 %    Atypical Lymphocytes Relative 0  0 - 6 %   BRAIN NATRIURETIC PEPTIDE       Result Value Ref Range    Pro-BNP 850 (*) 0 - 124 pg/mL   COMPREHENSIVE METABOLIC PANEL       Result Value Ref Range    Sodium 134 (*) 136 - 145 mmol/L    Potassium 6.1 (*) 3.5 - 5.1 mmol/L    Chloride 98 (*) 99 - 110 mmol/L    CO2 21  21 - 32 mmol/L    Anion Gap 15  3 - 16    Glucose 68 (*) 70 - 99 mg/dL    BUN 25 (*) 7 - 20 mg/dL    CREATININE 1.1  0.6 - 1.1 mg/dL    GFR Non-African American 52 (*) >60    GFR African American >60  >60    Calcium 11.0 (*) 8.3 - 10.6 mg/dL    Total Protein 5.2 (*) 6.4 - 8.2 g/dL    Alb 2.8 (*) 3.4 - 5.0 g/dL    Albumin/Globulin Ratio 1.2  1.1 - 2.2    Total Bilirubin <0.2  0.0 - 1.0 mg/dL    Alkaline Phosphatase 97  40 - 129 U/L    ALT 12  10 - 40 U/L    AST 32  15 - 37 U/L    Globulin 2.4     URINALYSIS       Result Value Ref Range    Color, UA YELLOW  Straw/Yellow    Clarity, UA TURBID (*) Clear    Glucose, Ur Negative  Negative mg/dL    Bilirubin Urine Negative  Negative    Ketones, Urine Negative  Negative mg/dL    Specific Gravity, UA 1.014  1.005-1.030    Blood, Urine LARGE (*) Negative    pH, UA 5.0  5.0-8.0    Protein, UA TRACE (*) Negative mg/dL    Urobilinogen, Urine 0.2  <2.0 E.U./dL    Nitrite, Urine Negative  Negative    Leukocyte Esterase, Urine Negative  Negative    Microscopic Examination YES      Urine Type Not Specified     PROTIME-INR       Result Value Ref Range    Protime 14.7 (*) 9.8 - 13.0 sec    INR 1.28 (*) 0.85 - 1.16   MICROSCOPIC URINALYSIS       Result Value Ref Range    RBC, UA 3-5 (*) 0 - 2 /HPF    Urinalysis Comments see below      Hyaline Casts, UA 8  0 - 8 /HPF    WBC, UA 5  0 - 5 /HPF    Epi Cells 3  0 - 5 /HPF   POTASSIUM       Result Value Ref Range    Potassium 6.1 (*) 3.5 - 5.1  mmol/L   POCT GLUCOSE       Result Value Ref Range    POC Glucose 74  70 - 99 mg/dl    Performed on ACCU-CHEK     POCT VENOUS       Result Value Ref Range    Lactate 4.27 (*) 0.40 - 2.00 mmol/L    Sample Type VEN      Performed on SEE BELOW     POCT VENOUS       Result Value Ref Range    POC Potassium 5.8 (*) 3.5 - 5.1 mEq/L    POC Glucose 54 (*) 70 - 99 mg/dl    Sample Type VEN      Performed on SEE BELOW       XR CHEST PORTABLE    Final Result: IMPRESSION:     Opacification of the inferior right hemithorax as above.  If there are prior    studies, comparison would be helpful.         The Ekg interpreted by me in the absence of a cardiologist shows.  sinus tachycardia, rate=106 bpm with a rate of 106 bpm  Axis is   Right axis  deviation  QTc is 417 ms  Intervals and Durations are unremarkable.      Poor R wave progression  Low-voltage QRS complexes  Nonspecific ST-T wave changes appreciated.  No evidence of acute ischemia.   No prior EKG is noted    Final Impression    1. Metastatic lung cancer (metastasis from lung to other site), unspecified laterality ([place])    2. Hypoglycemia    3. Hyperkalemia    4. Sepsis, due to unspecified organism ([place])      Blood pressure 99/46, pulse 100, temperature 97.6 ??F (36.4 ??C), temperature source Oral, resp. rate 16, height 5\' 4"  (1.626 m), weight 253 lb (114.76 kg), SpO2 92 %.    I'm concerned for sepsis secondary to pneumonia.  Patient was given IV fluids in the emergency department.  She was also given broad-spectrum IV fluids.  She was noted to have a bout of hypotension in the emergency department which had improved with IV fluids.  Patient will be admitted to telemetry.  Family was made aware of the patient's grave prognosis at this time.  Blood cultures have been drawn.  Case was discussed with hospitalist.    Patient was noted to be hyperkalemic and did receive insulin, glucose and sodium bicarbonate.  There was improvement in the patient's potassium.  She was noted to be  hypoglycemic and was given more dextrose.            Valerie Neu, MD  11/03/14 [phone]

## 2014-11-01 NOTE — Plan of Care (Signed)
Problem: Falls - Risk of  Goal: Absence of falls  Outcome: Ongoing  Fall precautions in place, bed alarm on, nonskid foot wear applied, bed in lowest position, and call light within reach. Will continue to monitor.             Problem: Pain:  Goal: Pain level will decrease  Pain level will decrease  Outcome: Ongoing  Assessed pts pain on 0-10 scale. Educated pt regarding pain medications available. Advised pt to notify RN if pain persists.

## 2014-11-01 NOTE — ED Notes (Signed)
Notified [name] of pulling infiltrated IV in L arm.  Will continue to monitor pt.    Glennis Brink, RN  11/01/14 [phone]

## 2014-11-01 NOTE — Other (Signed)
11/01/14 1522   Provider Notification   Reason for Communication Critical Value (comment)  (K+ 6.0)   Provider Name Dr Darnell Level   Provider Notification Physician   Method of Communication Page   Response See orders   Notification Time 1522   Will carry out new orders

## 2014-11-01 NOTE — Consults (Signed)
HEMATOLOGY/ONCOLOGY CONSULTATION:     11/01/2014 5:14 PM    REASON FOR CONSULT:  Lung cancer    PROVIDERS:  NABILA Katherine Roan, MD    CHIEF COMPLAINT:     Chief Complaint   Patient presents with   ??? Shortness of Breath     pt brought in via squad for SOB, pt's family called for low blood sugar--squad gave glucose tab and rechecked FSBS 74.  pt stage 4 lung cancer.  pt drove up 7 hrs from [employer] on Tuesday.       HISTORY OF PRESENT ILLNESS:     This is a 53 years old African American lady who was admitted for dyspnea, hypoglycemia and weakness.  She was found to have lung lesions about two months ago and was diagnosed metastatic lung cancer a month ago.  She received a couple of chemotherapy in [place] with last treatment about 10 days ago.  She and daughter stated she was diagnosed small cell lung cancer.  We were asked to see for further management.     PAST MEDICAL HISTORY:     Past Medical History   Diagnosis Date   ??? Acute respiratory failure with hypercapnia ([place])    ??? Diabetes mellitus ([place])    ??? Hypertension    ??? Leukocytosis    ??? Nausea and vomiting    ??? PAF (paroxysmal atrial fibrillation) ([facility])    ??? Pedal edema    ??? SVT (supraventricular tachycardia)    ??? UTI (urinary tract infection)    ??? VT (ventricular tachycardia) ([place])    ??? DVT (deep vein thrombosis) in pregnancy    ??? Factor 5 Leiden mutation, heterozygous ([place])    ??? Lung cancer ([place])    ??? Metastatic disease ([place])      rt eye; rt shoulder; brain       PAST SURGICAL HISTORY:        Past Surgical History   Procedure Laterality Date   ??? Lung biopsy     ??? Cholecystectomy     ??? Other surgical history       uterine ablation       SOCIAL HISTORY:     History     Social History   ??? Marital Status: Married     Spouse Name: N/A     Number of Children: N/A   ??? Years of Education: N/A     Occupational History   ??? Not on file.     Social History Main Topics   ??? Smoking status: Former Smoker -- 0.50 packs/day     Quit date: 07/01/2014   ??? Smokeless  tobacco: Not on file   ??? Alcohol Use: Yes      Comment: occasionally   ??? Drug Use: No   ??? Sexual Activity: Not on file     Other Topics Concern   ??? Not on file     Social History Narrative       FAMILY HISTORY:     Family History   Problem Relation Age of Onset   ??? Heart Disease Father        ALLERGIES:     Allergies as of 10/31/2014 - Review Complete 10/31/2014   Allergen Reaction Noted   ??? Latex Hives 10/31/2014   ??? Aspirin  10/31/2014   ??? Ibuprofen  10/31/2014   ??? Iodine Hives 10/31/2014   ??? Orange juice [orange oil] Hives 10/31/2014       MEDICATIONS:  No current facility-administered medications on file prior to encounter.     Current Outpatient Prescriptions on File Prior to Encounter   Medication Sig Dispense Refill   ??? indomethacin (INDOCIN) 50 MG capsule Take 1 capsule by mouth 2 times daily (with meals). 60 capsule 3   ??? metformin (GLUCOPHAGE) 500 MG tablet Take 2 tablets by mouth 2 times daily (with meals). 120 tablet 3   ??? hydrochlorothiazide (HYDRODIURIL) 25 MG tablet Take 1 tablet by mouth daily. 30 tablet 3       ROS:      Review of Systems - General ROS: positive for  - fatigue  negative for - chills or fever  Respiratory ROS: positive for - cough and shortness of breath  negative for - hemoptysis  Cardiovascular ROS: negative for - chest pain  Gastrointestinal ROS: no abdominal pain, change in bowel habits, or black or bloody stools  Genito-Urinary ROS: no dysuria, trouble voiding, or hematuria  Neurological ROS: no TIA or stroke symptoms    PHYSICAL EXAM:       BP 108/70 mmHg   Pulse 94   Temp(Src) 96.3 ??F (35.7 ??C) (Temporal)   Resp 18   Ht 5\' 4"  (1.626 m)   Wt 299 lb (135.626 kg)   BMI 51.30 kg/m2   SpO2 94%    General appearance: alert and cooperative, not in distress, obese, on oxygen.  Head: Normocephalic, without obvious abnormality, atraumatic, no sclera icterus  Neck: No palpable lymphadenopathy or mass in supraclavicular or cervical chains  Lungs: Clear to auscultation bilaterally, no  audible rales, wheezes or crackles, decreased BS  Heart: Regular rate and rhythm, S1, S2 normal, no gallops, rubs or murmur  Abdomen: Soft, non-tender; bowel sounds normal; no masses,  no organomegaly  Extremities: without cyanosis, clubbing, or asymmetry, trace leg edema  Skin: No jaundice, purpura or petechiae      LABS:     Lab Results   Component Value Date    WBC 27.0* 10/31/2014    HGB 9.6* 10/31/2014    HCT 30.8* 10/31/2014    MCV 74.0* 10/31/2014    PLT 147 10/31/2014    LYMPHOPCT 14.0 10/31/2014    RBC 4.16 10/31/2014    MCH 23.2* 10/31/2014    MCHC 31.3 10/31/2014    RDW 14.0 10/31/2014    NEUTOPHILPCT 67.0 10/31/2014    MONOPCT 12.0 10/31/2014    EOSPCT 2.0 06/22/2009    BASOPCT 0.3 10/31/2014    NEUTROABS 19.7* 10/31/2014    LYMPHSABS 3.8 10/31/2014    MONOSABS 3.2* 10/31/2014    EOSABS 0.3 10/31/2014    BASOSABS 0.0 10/31/2014       Lab Results   Component Value Date    NA 134* 10/31/2014    K 6.0* 11/01/2014    CL 98* 10/31/2014    CO2 21 10/31/2014    BUN 25* 10/31/2014    CREATININE 1.1 10/31/2014    GLUCOSE 68* 10/31/2014    CALCIUM 11.0* 10/31/2014    PROT 5.2* 10/31/2014    LABALBU 2.8* 10/31/2014    BILITOT <0.2 10/31/2014    ALKPHOS 97 10/31/2014    AST 32 10/31/2014    ALT 12 10/31/2014    LABGLOM 52* 10/31/2014    GFRAA >60 10/31/2014    AGRATIO 1.2 10/31/2014    GLOB 2.4 10/31/2014         IMAGING:     Xr Chest Portable    10/31/2014   EXAMINATION: SINGLE VIEW OF THE CHEST  10/31/2014 10:26 pm  COMPARISON: 06/22/2009  HISTORY: sob  Shortness of Breath  pt brought in via squad for SOB, pt's family called for low blood sugar--squad gave glucose tab and rechecked FSBS 74. pt stage 4 lung cancer. pt drove up 7 hrs from [employer] on Tuesday.    Lung biopsy  FINDINGS: There are no recent studies for comparison in this patient with history of lung cancer.  There is opacification of the inferior 1/3 to 1/2 of the right hemithorax that may represent a combination of airspace disease, mass and/or  pleural fluid.  There is a small band of atelectasis in the lingula.  Left lung is otherwise grossly clear.  Study was obtained at low lung volumes.  No evidence of a pneumothorax.  Heart size is upper limits of normal.  Study is limited by obesity.     10/31/2014   IMPRESSION: Opacification of the inferior right hemithorax as above.  If there are prior studies, comparison would be helpful.       STAGING:     No matching staging information was found for the patient.    ASSESSMENT:     1.  Shortness of breath.  2.  Metastatic lung cancer, recently diagnosed and treated.   3.  Hypoglycemia.  4.  Pneumonia.  5.  Leukocytosis, likely reactive.  6.  Microcytic anemia.   7.  Obesity.    PLAN:     Will try to get records from [facility].  Check labs including iron study.  Consider antibiotics.  Continue other supportive care.      Eulis Canner, MD 11/01/2014

## 2014-11-01 NOTE — ED Notes (Signed)
D50 amp given per Dr Volanda Napoleon.  Report called to [employer].  Verbalized  Understanding of report.  Will send pt to unit.    Glennis Brink, RN  11/01/14 [phone]

## 2014-11-01 NOTE — Progress Notes (Signed)
Pt admitted to 302, pt had low blood sugar at home, was not eating - current FSBS is 90. VSS. POC reviewed with patient. Pt is very tired and lethargic, c/o 8/10 pain "all over" after receiving Dilaudid in ER (bp drops). Admission and assessment completed. Pt denies further needs at this time. Bed alarm on.

## 2014-11-01 NOTE — H&P (Signed)
[facility] H[facility]ORY [facility]    11/01/2014 5:11 AM    Patient Information:  Valerie Thompson is a 53 y.o. female [id number]  PCP:  Darrick Huntsman, MD (Tel: [phone] )    Chief complaint:    Chief Complaint   Patient presents with   ??? Shortness of Breath     pt brought in via squad for SOB, pt's family called for low blood sugar--squad gave glucose tab and rechecked FSBS 74.  pt stage 4 lung cancer.  pt drove up 7 hrs from [employer] on Tuesday.      History of Present Illness:  Valerie Thompson is a 53 y.o. female who presented with low blood glucose. Symptom onset was acute for a time period of 5 hour(s). The severity is described as moderate. The course of his symptoms over time is resolved. The symptoms improved with sugar and worsened with none. The patient's symptom is associated with falls.  Patient is still confused and unable to give a good hx.  She reportedly fell down twice.  She said she may have bumped her head.  She has metastatic lung cancer.  She recently had a 7 hr drive.     History obtained from patient and electronic medical record.   Old medical records show patient has no recent hospitalization at this or sister facilities.    REVIEW OF SYSTEMS:   At least 10 systems were reviewed  and were negative except as documented in the HPI above.    Past Medical History:   has a past medical history of Acute respiratory failure with hypercapnia ([place]); Diabetes mellitus ([place]); Hypertension; Leukocytosis; Nausea and vomiting; PAF (paroxysmal atrial fibrillation) ([place]); Pedal edema; SVT (supraventricular tachycardia); UTI (urinary tract infection); VT (ventricular tachycardia) ([facility]); DVT (deep vein thrombosis) in pregnancy; Factor 5 Leiden mutation, heterozygous ([place]); Lung cancer ([place]); and Metastatic disease ([place]).     Past Surgical History:   has past surgical history that includes Lung biopsy; Cholecystectomy; and other  surgical history.     Medications:  No current facility-administered medications on file prior to encounter.     Current Outpatient Prescriptions on File Prior to Encounter   Medication Sig Dispense Refill   ??? metformin (GLUCOPHAGE) 500 MG tablet Take 2 tablets by mouth 2 times daily (with meals). 120 tablet 3   ??? indomethacin (INDOCIN) 50 MG capsule Take 1 capsule by mouth 2 times daily (with meals). 60 capsule 3   ??? hydrochlorothiazide (HYDRODIURIL) 25 MG tablet Take 1 tablet by mouth daily. 30 tablet 3       Allergies:  Allergies   Allergen Reactions   ??? Latex Hives   ??? Aspirin      Must be enteric coated   ??? Ibuprofen      SOB, hives   ??? Iodine Hives   ??? Orange Juice [Orange Oil] Hives        Social History:   reports that she quit smoking about 4 months ago. She does not have any smokeless tobacco history on file. She reports that she drinks alcohol. She reports that she does not use illicit drugs.     Family History:  family history includes Heart Disease in her father.     Physical Exam:  BP 90/56 mmHg   Pulse 102   Temp(Src) 97.2 ??F (36.2 ??C) (Temporal)   Resp 16   Ht 5\' 4"  (1.626 m)   Wt 299 lb (135.626 kg)   BMI 51.30 kg/m2  SpO2 98%    General appearance:  Appears comfortable. Well nourished  Eyes: Sclera clear, pupils equal  ENT: Moist mucus membranes, no thrush. Trachea midline.  Cardiovascular: Regular rhythm, normal S1, S2. No murmur, gallop, rub. ++ bil leg edema (R>L)  Respiratory: Clear to auscultation bilaterally, no wheeze, good inspiratory effort  Gastrointestinal: Abdomen soft, non-tender, not distended, normal bowel sounds  Musculoskeletal: No cyanosis in digits, neck supple  Neurology: Cranial nerves grossly intact. Alert and oriented in time, place and person. No speech or motor deficits  Psychiatry: Appropriate affect. Not agitated  Skin: Warm, dry, normal turgor, no rash    Labs:  CBC:   Lab Results   Component Value Date    WBC 27.0 10/31/2014    RBC 4.16 10/31/2014    HGB 9.6 10/31/2014     HCT 30.8 10/31/2014    MCV 74.0 10/31/2014    MCH 23.2 10/31/2014    MCHC 31.3 10/31/2014    RDW 14.0 10/31/2014    PLT 147 10/31/2014    MPV 10.0 10/31/2014     BMP:    Lab Results   Component Value Date    NA 134 10/31/2014    K 6.1 11/01/2014    CL 98 10/31/2014    CO2 21 10/31/2014    BUN 25 10/31/2014    CREATININE 1.1 10/31/2014    CALCIUM 11.0 10/31/2014    GFRAA >60 10/31/2014    GFRAA >60 06/22/2009    LABGLOM 52 10/31/2014    GLUCOSE 68 10/31/2014       Chest Xray:   EKG:    I visualized CXR images and EKG strips and my findings are as discussed above.    Discussed patient's management with the ER Practitioner, Roetting CNP.    Problem List  Active Problems:    Hypoglycemia        Assessment/Plan:   1. Hypoglycemia: Hold all home anti-DM meds and insulin.  2. Hyperkalemia: Already given insulin/D50 and Kayexalate. Follow K+ level.  3. Metastatic lung cancer: She has a scheduled appointment as UC. She will follow up with them on discharge.  4. HTN: Continue home meds.  5. Bil leg edema (R>L): She has a very high Well's score for pre-test probability for thromboembolism. She should actually be fully anticoagulated even if DVT is ruled out. Get venus dopplers. Will start her on full dose Lovenox (1mg /kg BID) as soon as her mental state improves and we are able to determine there is no contraindication for anticoagulation (such as recent major bleeding). She just moved to this area so her records are not available.  6. Paroxysmal A-fib: Continue home Amiodarone. Not on home anticoagulation. Will address this later as discussed above.  7. DM type 2: Presently hypoglycemic. Continue to hold home meds as discussed above.  8. Morbid obesity with BMI of 51.4.  9. DVT prophy: lovenox    Admit as inpatient. I anticipate hospitalization spanning more than two midnights for investigation and treatment of the above medically necessary diagnoses.      Jubilee Vivero Moreen Fowler, MD    11/01/2014 5:11 AM

## 2014-11-01 NOTE — Progress Notes (Signed)
Pt assessment completed, see flowsheet. Pt A/O x4, VSS, afebrile. Updated on POC. Patient c/o stomach and breast pain-Patient wanted to rest. Call light within reach. Bed alarm is engaged. Will continue to monitor.

## 2014-11-02 ENCOUNTER — Inpatient Hospital Stay: Admit: 2014-11-02 | Primary: Internal Medicine

## 2014-11-02 ENCOUNTER — Encounter: Primary: Internal Medicine

## 2014-11-02 LAB — BASIC METABOLIC PANEL
Anion Gap: 14 (ref 3–16)
Anion Gap: 9 (ref 3–16)
BUN: 28 mg/dL — ABNORMAL HIGH (ref 7–20)
BUN: 30 mg/dL — ABNORMAL HIGH (ref 7–20)
CO2: 24 mmol/L (ref 21–32)
CO2: 28 mmol/L (ref 21–32)
Calcium: 11.7 mg/dL — ABNORMAL HIGH (ref 8.3–10.6)
Calcium: 12.2 mg/dL (ref 8.3–10.6)
Chloride: 97 mmol/L — ABNORMAL LOW (ref 99–110)
Chloride: 99 mmol/L (ref 99–110)
Creatinine: 1.2 mg/dL — ABNORMAL HIGH (ref 0.6–1.1)
Creatinine: 1.3 mg/dL — ABNORMAL HIGH (ref 0.6–1.1)
GFR African American: 57 — AB (ref >60)
GFR African American: 52 — AB (ref >60)
GFR Non-African American: 47 — AB (ref >60)
GFR Non-African American: 43 — AB (ref >60)
Glucose: 175 mg/dL — ABNORMAL HIGH (ref 70–99)
Glucose: 172 mg/dL — ABNORMAL HIGH (ref 70–99)
Potassium: 6.2 mmol/L (ref 3.5–5.1)
Potassium: 5.2 mmol/L — ABNORMAL HIGH (ref 3.5–5.1)
Sodium: 135 mmol/L — ABNORMAL LOW (ref 136–145)
Sodium: 136 mmol/L (ref 136–145)

## 2014-11-02 LAB — CBC
Hematocrit: 29.6 % — ABNORMAL LOW (ref 36.0–48.0)
Hemoglobin: 9.3 g/dL — ABNORMAL LOW (ref 12.0–16.0)
MCH: 23.2 pg — ABNORMAL LOW (ref 26.0–34.0)
MCHC: 31.5 g/dL (ref 31.0–36.0)
MCV: 73.7 fL — ABNORMAL LOW (ref 80.0–100.0)
MPV: 10.2 fL (ref 5.0–10.5)
Platelets: 246 10*3/uL (ref 135–450)
RBC: 4.01 M/uL (ref 4.00–5.20)
RDW: 14.6 % (ref 12.4–15.4)
WBC: 40.1 10*3/uL (ref 4.0–11.0)

## 2014-11-02 LAB — BLOOD GAS, ARTERIAL
Base Excess, Arterial: 3.1 mmol/L — ABNORMAL HIGH (ref <=3.0)
Carboxyhgb, Arterial: 2.3 % — ABNORMAL HIGH (ref 0.0–1.5)
HCO3, Arterial: 27.4 mmol/L (ref 21.0–29.0)
Hemoglobin, Art, Extended: 9.2 g/dL — ABNORMAL LOW (ref 12.0–16.0)
Methemoglobin, Arterial: 0.3 % (ref <1.5)
O2 Content, Arterial: 12 mL/dL
O2 Sat, Arterial: 94.9 % (ref >92)
TCO2, Arterial: 28.6 mmol/L
pCO2, Arterial: 40.5 mmHg (ref 35.0–45.0)
pH, Arterial: 7.448 (ref 7.350–7.450)
pO2, Arterial: 71.1 mmHg — ABNORMAL LOW (ref 75.0–108.0)

## 2014-11-02 LAB — VITAMIN D 25 HYDROXY: Vit D, 25-Hydroxy: 14.6 ng/mL — ABNORMAL LOW (ref >=30)

## 2014-11-02 LAB — PTH, INTACT: PTH: 5.2 pg/mL — ABNORMAL LOW (ref 14.0–72.0)

## 2014-11-02 LAB — CK: Total CK: 42 U/L (ref 26–192)

## 2014-11-02 LAB — PHOSPHORUS
Phosphorus: 4.8 mg/dL (ref 2.5–4.9)
Phosphorus: 5.1 mg/dL — ABNORMAL HIGH (ref 2.5–4.9)

## 2014-11-02 LAB — POCT GLUCOSE
POC Glucose: 127 mg/dl — ABNORMAL HIGH (ref 70–99)
POC Glucose: 148 mg/dl — ABNORMAL HIGH (ref 70–99)
POC Glucose: 199 mg/dl — ABNORMAL HIGH (ref 70–99)
POC Glucose: 174 mg/dl — ABNORMAL HIGH (ref 70–99)
POC Glucose: 237 mg/dl — ABNORMAL HIGH (ref 70–99)
POC Glucose: 183 mg/dl — ABNORMAL HIGH (ref 70–99)
POC Glucose: 185 mg/dl — ABNORMAL HIGH (ref 70–99)

## 2014-11-02 LAB — PERIPHERAL BLOOD SMEAR, PATH REVIEW

## 2014-11-02 LAB — FERRITIN: Ferritin: 414.2 ng/mL — ABNORMAL HIGH (ref 15.0–150.0)

## 2014-11-02 LAB — URIC ACID: Uric Acid, Serum: 7.8 mg/dL — ABNORMAL HIGH (ref 2.6–6.0)

## 2014-11-02 LAB — IRON AND TIBC
Iron Saturation: 24 % (ref 15–50)
Iron: 34 ug/dL — ABNORMAL LOW (ref 37–145)
TIBC: 142 ug/dL — ABNORMAL LOW (ref 260–445)

## 2014-11-02 LAB — LACTATE DEHYDROGENASE: LD: 398 U/L — ABNORMAL HIGH (ref 100–190)

## 2014-11-02 MED ORDER — FUROSEMIDE 10 MG/ML IJ SOLN
10 MG/ML | Freq: Once | INTRAMUSCULAR | Status: AC
Start: 2014-11-02 — End: 2014-11-02
  Administered 2014-11-02: 17:00:00 40 mg via INTRAVENOUS

## 2014-11-02 MED ORDER — DEXTROSE 5 % IV SOLN
5 % | INTRAVENOUS | Status: DC | PRN
Start: 2014-11-02 — End: 2014-11-06

## 2014-11-02 MED ORDER — SODIUM CHLORIDE 0.9 % IV SOLN
0.9 % | INTRAVENOUS | Status: DC
Start: 2014-11-02 — End: 2014-11-02
  Administered 2014-11-02: 16:00:00 via INTRAVENOUS

## 2014-11-02 MED ORDER — PIPERACILLIN-TAZOBACTAM 3.375 G IN 50 ML (PREMIX) IVPB EXTENDED
Freq: Three times a day (TID) | INTRAVENOUS | Status: DC
Start: 2014-11-02 — End: 2014-11-06
  Administered 2014-11-02 – 2014-11-06 (×11): 3.375 g via INTRAVENOUS

## 2014-11-02 MED ORDER — GLUCAGON HCL RDNA (DIAGNOSTIC) 1 MG IJ SOLR
1 MG | INTRAMUSCULAR | Status: DC | PRN
Start: 2014-11-02 — End: 2014-11-06

## 2014-11-02 MED ORDER — GLUCOSE 40 % PO GEL
40 % | ORAL | Status: DC | PRN
Start: 2014-11-02 — End: 2014-11-06

## 2014-11-02 MED ORDER — INSULIN REGULAR HUMAN 100 UNIT/ML IJ SOLN
100 UNIT/ML | Freq: Once | INTRAMUSCULAR | Status: AC
Start: 2014-11-02 — End: 2014-11-02
  Administered 2014-11-02: 19:00:00 10 [IU] via INTRAVENOUS

## 2014-11-02 MED ORDER — SODIUM POLYSTYRENE SULFONATE 15 GM/60ML PO SUSP
15 GM/60ML | Freq: Once | ORAL | Status: AC
Start: 2014-11-02 — End: 2014-11-02
  Administered 2014-11-02: 17:00:00 30 g via ORAL

## 2014-11-02 MED ORDER — PIPERACILLIN-TAZOBACTAM IN DEX 3-0.375 GM/50ML IV SOLN
Freq: Four times a day (QID) | INTRAVENOUS | Status: DC
Start: 2014-11-02 — End: 2014-11-01

## 2014-11-02 MED ORDER — SODIUM CHLORIDE 0.9 % IV SOLN
0.9 % | INTRAVENOUS | Status: DC
Start: 2014-11-02 — End: 2014-11-03
  Administered 2014-11-02: 20:00:00 via INTRAVENOUS

## 2014-11-02 MED ORDER — FUROSEMIDE 10 MG/ML IJ SOLN
10 MG/ML | Freq: Once | INTRAMUSCULAR | Status: DC
Start: 2014-11-02 — End: 2014-11-02

## 2014-11-02 MED ORDER — SODIUM CHLORIDE 0.9 % IV SOLN
0.9 % | INTRAVENOUS | Status: AC
Start: 2014-11-02 — End: 2014-11-01
  Administered 2014-11-02: 02:00:00 1000

## 2014-11-02 MED ORDER — CALCIUM GLUCONATE 10 % IV SOLN
10 % | Freq: Once | INTRAVENOUS | Status: DC
Start: 2014-11-02 — End: 2014-11-02

## 2014-11-02 MED ORDER — SODIUM CHLORIDE 0.9 % IV SOLN
0.9 % | INTRAVENOUS | Status: DC
Start: 2014-11-02 — End: 2014-11-06
  Administered 2014-11-02 – 2014-11-04 (×2): via INTRAVENOUS

## 2014-11-02 MED ORDER — IOPAMIDOL 76 % IV SOLN
76 % | Freq: Once | INTRAVENOUS | Status: AC | PRN
Start: 2014-11-02 — End: 2014-11-02
  Administered 2014-11-02: 13:00:00 100 mL via INTRAVENOUS

## 2014-11-02 MED ORDER — INSULIN REGULAR HUMAN 100 UNIT/ML IJ SOLN
100 UNIT/ML | Freq: Once | INTRAMUSCULAR | Status: DC
Start: 2014-11-02 — End: 2014-11-02

## 2014-11-02 MED ORDER — DEXTROSE 50 % IV SOLN
50 % | INTRAVENOUS | Status: DC | PRN
Start: 2014-11-02 — End: 2014-11-06

## 2014-11-02 MED ORDER — SODIUM BICARBONATE 8.4 % IV SOLN
8.4 % | Freq: Once | INTRAVENOUS | Status: AC
Start: 2014-11-02 — End: 2014-11-02
  Administered 2014-11-02: 17:00:00 50 meq via INTRAVENOUS

## 2014-11-02 MED ORDER — SODIUM POLYSTYRENE SULFONATE 15 GM/60ML PO SUSP
15 GM/60ML | Freq: Once | ORAL | Status: DC
Start: 2014-11-02 — End: 2014-11-02

## 2014-11-02 MED ORDER — CALCIUM GLUCONATE 10 % IV SOLN
10 % | Freq: Once | INTRAVENOUS | Status: AC
Start: 2014-11-02 — End: 2014-11-02
  Administered 2014-11-02: 17:00:00 1 g via INTRAVENOUS

## 2014-11-02 MED FILL — PREDNISONE 10 MG PO TABS: 10 MG | ORAL | Qty: 5

## 2014-11-02 MED FILL — HYDROCHLOROTHIAZIDE 25 MG PO TABS: 25 MG | ORAL | Qty: 1

## 2014-11-02 MED FILL — SODIUM CHLORIDE 0.9 % IV SOLN: 0.9 % | INTRAVENOUS | Qty: 1000

## 2014-11-02 MED FILL — NORMAL SALINE FLUSH 0.9 % IV SOLN: 0.9 % | INTRAVENOUS | Qty: 10

## 2014-11-02 MED FILL — AMIODARONE HCL 200 MG PO TABS: 200 MG | ORAL | Qty: 1

## 2014-11-02 MED FILL — FUROSEMIDE 10 MG/ML IJ SOLN: 10 MG/ML | INTRAMUSCULAR | Qty: 4

## 2014-11-02 MED FILL — DIAZEPAM 5 MG PO TABS: 5 MG | ORAL | Qty: 1

## 2014-11-02 MED FILL — OXYCODONE HCL 5 MG PO TABS: 5 MG | ORAL | Qty: 2

## 2014-11-02 MED FILL — DOXYCYCLINE HYCLATE 100 MG PO CAPS: 100 MG | ORAL | Qty: 1

## 2014-11-02 MED FILL — GABAPENTIN 100 MG PO CAPS: 100 MG | ORAL | Qty: 1

## 2014-11-02 MED FILL — SPS 15 GM/60ML PO SUSP: 15 GM/60ML | ORAL | Qty: 120

## 2014-11-02 MED FILL — SEROQUEL 25 MG PO TABS: 25 MG | ORAL | Qty: 2

## 2014-11-02 MED FILL — PANTOPRAZOLE SODIUM 40 MG PO TBEC: 40 MG | ORAL | Qty: 1

## 2014-11-02 MED FILL — DOK 100 MG PO CAPS: 100 MG | ORAL | Qty: 1

## 2014-11-02 MED FILL — ZOSYN 3-0.375 GM/50ML IV SOLN: INTRAVENOUS | Qty: 50

## 2014-11-02 MED FILL — LORAZEPAM 0.5 MG PO TABS: 0.5 MG | ORAL | Qty: 1

## 2014-11-02 MED FILL — DIPHENHYDRAMINE HCL 25 MG PO TABS: 25 MG | ORAL | Qty: 2

## 2014-11-02 MED FILL — ISOVUE-370 76 % IV SOLN: 76 % | INTRAVENOUS | Qty: 100

## 2014-11-02 MED FILL — CALCIUM GLUCONATE 10 % IV SOLN: 10 % | INTRAVENOUS | Qty: 10

## 2014-11-02 MED FILL — SODIUM BICARBONATE 8.4 % IV SOLN: 8.4 % | INTRAVENOUS | Qty: 50

## 2014-11-02 MED FILL — LOVENOX 30 MG/0.3ML SC SOLN: 30 MG/0.3ML | SUBCUTANEOUS | Qty: 0.3

## 2014-11-02 MED FILL — PREDNISONE 10 MG PO TABS: 10 MG | ORAL | Qty: 1

## 2014-11-02 NOTE — Progress Notes (Signed)
Dr. Verita Schneiders phoned regarding patients condition. Patients BP low, heart rate steadily increasing; 140-160's. NS bolus initiated. STAT EKG obtained. Patient remains lethargic.

## 2014-11-02 NOTE — Progress Notes (Signed)
Hospitalist paged about WBC count

## 2014-11-02 NOTE — Progress Notes (Addendum)
[facility] PROGRESS NOTE    11/02/2014 9:47 AM        Name: Valerie Thompson .              Admitted: 11/01/2014  Primary Care Provider: Darrick Huntsman, MD (Tel: [phone])      Subjective:  Marland Kitchen  Pt seen and examined.   Moaning and states that she is having diarrhea due to kayexalate  Has had 2 cycles of chemotherapy prior to coming here.Last in early November  Unable to obtain much hx from her.    WBC -> 40K, pt is afebrile, diarrhea 2 to  Kayexalate  Most likely pt is given Neulasta with CT  Will try to get more information from her daughter  Will check stool for CDiff    Serum potassium remains elevated, no EKG changes  ? Tumor lysis 2 to CT  Check K every 6 hours, Nephrology consulted  Rx with Calcium gluconate, Insulin IV, D 50, Bicarb and repeat dose of Kayexalate, IVF  Slight elevation in uric acid and normal phosphorous level    Pt has developed borderline BP with HR-> 160s  Pt's PO intake is very poor with CT effects  Will give 2 L of NS  I saw her again around 1:30 PM  SBP-> 99 and HR has come down to 105  If no improvement will get evaluated for ICU transfer    Reviewed interval ancillary notes    Current Medications    0.9 % sodium chloride infusion Continuous   amiodarone (CORDARONE) tablet 200 mg Daily   diazepam (VALIUM) tablet 5 mg BID   docusate sodium (COLACE) capsule 100 mg BID   doxycycline (VIBRAMYCIN) capsule 100 mg BID   gabapentin (NEURONTIN) capsule 100 mg TID   QUEtiapine (SEROQUEL) tablet 50 mg BID   polyethylene glycol (GLYCOLAX) packet 17 g Daily PRN   pantoprazole (PROTONIX) tablet 40 mg QAM AC   oxyCODONE (ROXICODONE) immediate release tablet 10 mg Q6H PRN   therapeutic multivitamin-minerals 1 tablet Daily   LORazepam (ATIVAN) tablet 0.5 mg Q12H PRN   hydrochlorothiazide (HYDRODIURIL) tablet 25 mg Daily   sodium chloride flush 0.9 % injection 10 mL 2 times per day   sodium chloride flush 0.9 %  injection 10 mL PRN   acetaminophen (TYLENOL) tablet 650 mg Q4H PRN   magnesium hydroxide (MILK OF MAGNESIA) 400 MG/5ML suspension 30 mL Daily PRN   ondansetron (ZOFRAN) injection 4 mg Q6H PRN   dextrose 50 % solution 25 g PRN   enoxaparin (LOVENOX) injection 30 mg Daily   piperacillin-tazobactam (ZOSYN) IVPB extended infusion 3.375 g Q8H       Objective:  BP 97/61 mmHg   Pulse 109   Temp(Src) 96.8 ??F (36 ??C) (Temporal)   Resp 18   Ht 5\' 4"  (1.626 m)   Wt 299 lb (135.626 kg)   BMI 51.30 kg/m2   SpO2 95%    Intake/Output Summary (Last 24 hours) at 11/02/14 0947  Last data filed at 11/02/14 0442   Gross per 24 hour   Intake    110 ml   Output   1600 ml   Net  -1490 ml    Wt Readings from Last 3 Encounters:   11/01/14 299 lb (135.626 kg)       General appearance:  Appears ill, morbidly obese.  Eyes: Sclera clear. Pupils equal.  ENT: Moist oral mucosa. Trachea midline, no adenopathy.  Cardiovascular: Regular rhythm, normal S1, S2. Tachycardic. No murmur. No  edema in lower extremities  Respiratory: Not using accessory muscles. Good inspiratory effort. Clear to auscultation bilaterally, no wheeze or crackles.   GI: Abdomen soft, no tenderness, not distended, normal bowel sounds  Musculoskeletal: No cyanosis in digits, neck supple  Neurology: CN 2-12 grossly intact. No speech or motor deficits  Psych: Normal affect. Alert and oriented in time, place and person  Skin: Warm, dry, normal turgor    Labs and Tests:  CBC:   Recent Labs      10/31/14   2155   WBC  27.0*   HGB  9.6*   PLT  147     BMP:  Recent Labs      10/31/14   2155  11/01/14   0021  11/01/14   1427   NA  134*   --    --    K  6.1*  6.1*  6.0*   CL  98*   --    --    CO2  21   --    --    BUN  25*   --    --    CREATININE  1.1   --    --    GLUCOSE  68*   --    --      Hepatic: Recent Labs      10/31/14   2155   AST  32   ALT  12   BILITOT  <0.2   ALKPHOS  97       Discussed care with the pt and RN  Discussed the pt with Nephrology service.    CTA chest  There  is no convincing evidence of acute pulmonary embolus. The study is   somewhat technically limited with regard to detection of a small or   peripheral embolus.      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 Innumerous metastatic nodules   noted within the body wall structures. Presumed metastatic disease involving   the adrenals. A metastatic nodule is seen posterior to the upper pole of the   right kidney.       Problem List  Active Problems:    Hypoglycemia       Assessment & Plan:   1- Acute on chronic respiratory failure  Recently diagnosed CA lung stage 4  CTA chest done and acute PE is ruled out due to hypoxia, respiratory distress at rest and tachycardia  Bronchodilator therapy, supplemental O2  Continue on current ABX for now    2- Hyperkalemia - ? Tumor Lysis Syndrome unlikely with CA lung  Mild elevation in serum uric acid levels and LDH, normal CPK and phosphorous levels  IVF, treated with calcium gluconate, bicarb, Insulin IV, D 50, repeat dose of Kayexalate, IVF  Monitor serum potassium every 6 H  Will add Probenecid  Nephrology consulted    3- Hypoglycemia - pt is on multiple meds at home - Metformin, Glipizide, lantus and Actos  All above meds held and pt's PO intake is very poor  BG improving -> 200, SSI - low dose until PO intake is adequate    4- Borderline BP with tachycardia  Most likely cause - dehydration  Aggressive IVF  Possible transfer to ICU if no improvement  Blood cultures NTD  Will continue IV ABX for now    5- Hx of stage 4 lung CA -recently diagnosed  Diagnosed and biopsied in [place] in 08/2014  Will obtain further  information  Oncology consulted  CTA chest with contrast after premedication as above.    6- Leukocytosis - 27 K on admission, worsening to 40 K  ? Neulasta following chemotherapy or from steroids that she received for premedication for contrast allergy  Check stool for CDiff  Continue on current  ABX  Blood cultures - NTD    7- DVT PPX    8- Hypercalcemia - 2 to lung cancer   DC HCTZ as per nephrology   IVF and IV Lasix given  Monitor    Palliative care consult, need to address the code status given the hx of advanced lung cancer.  Follow labs  Oncology and Nephrology input appreciated    Diet: DIET GENERAL;  Code:Full Code    Colen Darling, MD   11/02/2014 9:47 AM

## 2014-11-02 NOTE — Progress Notes (Signed)
Pt. C/o bladder spasms-Paged Hospitalist-Waiting for response. Will continue to monitor.

## 2014-11-02 NOTE — Progress Notes (Signed)
ABG was performed without complication and sent to lab

## 2014-11-02 NOTE — Progress Notes (Signed)
Hospitalist made aware of K level

## 2014-11-02 NOTE — Progress Notes (Signed)
Shift assessment completed. Pt is drowsy but will wake to voice. VSS, held seroquel d/t pt being so drowsy. Changed brief-pt was incontinent of stool after kayexelate. Pt denies needs at this time, family at bedside.

## 2014-11-02 NOTE — Consults (Signed)
[facility]  Nephrology Consult Note  [id number]  [phone]   [email or web address]        Reason for Consult: hyperkalemia    HISTORY OF PRESENT ILLNESS:                The patient is a 53 y.o. female with significant past medical history of metastastic lung Ca , HTN, paroxysmal A. Fib, factor 5 Leiden mutation, DM who was admitted for hypoglycemia one day prior.  She has recently moved her from [place] to be close to family.  K was noted to be 6.1 on admission.  Scr is 1.2.  She is not on RAAS blockade or K supplements at home.  She is on metformin for DM.    -BP relatively low  -she has bene on Indocin at home and has received a dose here  -she has received insulin, sodium bicarbonate, kayexalate 15g PO x 1  -she is on MVI   -currently lethargic and unable to provide much history  -she is s/p CTA this am    Past Medical History:        Diagnosis Date   ??? Acute respiratory failure with hypercapnia ([place])    ??? Diabetes mellitus ([place])    ??? Hypertension    ??? Leukocytosis    ??? Nausea and vomiting    ??? PAF (paroxysmal atrial fibrillation) ([facility])    ??? Pedal edema    ??? SVT (supraventricular tachycardia)    ??? UTI (urinary tract infection)    ??? VT (ventricular tachycardia) ([place])    ??? DVT (deep vein thrombosis) in pregnancy    ??? Factor 5 Leiden mutation, heterozygous ([place])    ??? Lung cancer ([place])    ??? 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 Lung biopsy     ??? Cholecystectomy     ??? Other surgical history       uterine ablation       Current Medications:    No current facility-administered medications on file prior to encounter.     Current Outpatient Prescriptions on File Prior to Encounter   Medication Sig Dispense Refill   ??? indomethacin (INDOCIN) 50 MG capsule Take 1 capsule by mouth 2 times daily (with meals). 60 capsule 3   ??? metformin (GLUCOPHAGE) 500 MG tablet Take 2 tablets by mouth 2 times daily (with meals).  120 tablet 3   ??? hydrochlorothiazide (HYDRODIURIL) 25 MG tablet Take 1 tablet by mouth daily. 30 tablet 3       Allergies:  Latex; Aspirin; Ibuprofen; Iodine; and Orange juice    Social History:    History     Social History   ??? Marital Status: Married     Spouse Name: N/A     Number of Children: N/A   ??? Years of Education: N/A     Occupational History   ??? Not on file.     Social History Main Topics   ??? Smoking status: Former Smoker -- 0.50 packs/day     Quit date: 07/01/2014   ??? Smokeless tobacco: Not on file   ??? Alcohol Use: Yes      Comment: occasionally   ??? Drug Use: No   ??? Sexual Activity: Not on file     Other Topics Concern   ??? Not on file  Social History Narrative       Family History:       Problem Relation Age of Onset   ??? Heart Disease Father          REVIEW OF SYSTEMS:    Unable to obtain due to patient factors    PHYSICAL EXAM:    Vitals:    BP 97/61 mmHg   Pulse 109   Temp(Src) 96.8 ??F (36 ??C) (Temporal)   Resp 18   Ht 5\' 4"  (1.626 m)   Wt 299 lb (135.626 kg)   BMI 51.30 kg/m2   SpO2 95%  I/O last 3 completed shifts:  In: 210 [P.O.:200; I.V.:10]  Out: 1600 [Urine:1600]       Physical Exam:  Gen: morbidly obesedifficult to arouse, lethargic   HEENT: anciteric sclera  CV: RRR no m/r/g.  No S3.  Lungs: scattered rhonchi, no resp distress  Abd: S/NT +BS, obese  Ext: +edema, no cyanosis  Skin: Warm.  No rashes appreciated.  GU: foley in place  Neuro: asleep, difficult to arouse, non-focal  Joints: No erythema noted over joints.    DATA:    CBC with Differential:    Lab Results   Component Value Date    WBC 27.0 10/31/2014    RBC 4.16 10/31/2014    HGB 9.6 10/31/2014    HCT 30.8 10/31/2014    PLT 147 10/31/2014    MCV 74.0 10/31/2014    MCH 23.2 10/31/2014    MCHC 31.3 10/31/2014    RDW 14.0 10/31/2014    SEGSPCT 61.0 06/22/2009    BANDSPCT 6 10/31/2014    LYMPHOPCT 14.0 10/31/2014    MONOPCT 12.0 10/31/2014    EOSPCT 2.0 06/22/2009    BASOPCT 0.3 10/31/2014    MONOSABS 3.2 10/31/2014    LYMPHSABS 3.8  10/31/2014    EOSABS 0.3 10/31/2014    BASOSABS 0.0 10/31/2014    DIFFTYPE Manual-K 06/22/2009     BMP:    Lab Results   Component Value Date    NA 135 11/02/2014    K 6.2 11/02/2014    CL 97 11/02/2014    CO2 24 11/02/2014    BUN 28 11/02/2014    LABALBU 2.8 10/31/2014    CREATININE 1.2 11/02/2014    CALCIUM 11.7 11/02/2014    GFRAA 57 11/02/2014    GFRAA >60 06/22/2009    LABGLOM 47 11/02/2014    GLUCOSE 175 11/02/2014       IMPRESSION/RECOMMENDATIONS:    Hyperkalemia: UA with e/o rhadbo-large blood with only 3-5 RBC/? 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 Tumor lysis-she is on prednisone  -pt not on supplements/RAAS blockade  -check CK/LDH/uric acid noted  -s/p kayexalate 15g PO last night-repeat 30g PO today  -clinically volume up  -add lasix  -stop HCTZ due to high calcium  -monitor renal function lcosely  -she is at risk for AKI-2/2 contrast/high calcium  -stop MVI, low K diet  -at present no indication for dialysis-monitor closely regarding need    AMS:  -lethargic this am  -check ABG  -d/c gabapentin/valium for now  -would hold ativan     Hypercalcemia:  -2/2 underlying lung malignancy  -continue IVF with lasix  -stop HCTZ  -check PTH/vitamin D    Leukcocytosis:  -blood cultures done  -on zosyn/doxy  -per primary    Anemia:  -transfuse prn    Hypotension:  -sepsis vs adrenal insufficiency  -continue IVF  -currently stable    Metastatic lung  Ca:  -per oncology  -recent chemo per records      Thank you for allowing me to participate in the care of this patient.  I will continue to follow along.  Please call with questions.    Alba Cory, MD,  11/02/2014

## 2014-11-02 NOTE — Consults (Signed)
Nutrition Therapy    Type and Reason for Visit: Initial, Consult (RD consult for low appetite)    Nutrition Recommendations: Continue current diet, Start ONS (Added ONS BID until pt tol > 50% of meals offered; Monitor BS)    Subjective: Admitted d/t hyperkalemia; pt from [place] [facility] and recently dx w/Stage IV Lung Ca 08/2014.  (Per RN, pt given ativan @ 6:30AM and has been very tited/sleepy throughout the AM. RD unable to see as pt being changed. No PO noted yet since admitted. WIll monitor)    Malnutrition Status: Insufficient data    Nutrition Assessment:  ?? Nutrition-Focused Physical Findings:    ?? Current Nutrition Therapies  ?? Oral Diet Orders: General   ?? Oral Diet intake: Unable to assess  ?? Oral Nutrition Supplement (ONS) Orders:    ?? ONS intake:    ?? Anthropometric Measures: Ht: 5\' 4"  (162.6 cm)   ?? Current Body Wt: 299 lb (135.626 kg) (136 kg)  ?? Usual Body Wt:    ?? % Weight Loss:     ?? Admission Body Wt:     ?? BMI Classification: BMI > or equal to 40.0 Obese Class III (BMI 51.4)  ?? Comparative Standards: (Estimated Nutrition Needs): na    Nutrition Diagnosis: no nutr dx at this time.     Nutrition Interventions: Continued Inpatient Monitoring    Nutrition Evaluation: No Change in Progress Toward Goals     Electronically signed by Dwana Melena, RD, LD on 11/02/14 at 10:34 AM    Contact Number: [phone]

## 2014-11-02 NOTE — Progress Notes (Signed)
Patients morning assessment completed, VSS; see flowsheets. Patient alert and oriented x4. Patient in bed awake with no acute distress noted. Patient updated on plan of care for today, all questions answered. Patient instructed to call out with any needs, verbalized understanding. Patient voices no needs at this time. Call light within reach, will continue to monitor.

## 2014-11-02 NOTE — Progress Notes (Signed)
Dr. Verita Schneiders informed of BP 96/54 after 2 fluid bolus. HR 109, BS 186. K back to 5.2. Will continue to monitor

## 2014-11-02 NOTE — Progress Notes (Signed)
Patients second liter NS bolus initiated. Glucose 183- BP: 105/67 HR:118-sinus tachycardia. Patient responsive to painful stimuli. Will continue to monitor.

## 2014-11-02 NOTE — Plan of Care (Signed)
Problem: Falls - Risk of  Intervention: Fall risk assessment  Fall risk assessment complete  Intervention: Fall precautions  Call light in reach, hourly visual checks, bed wheels locked in lowest position    Goal: Absence of falls  Outcome: Ongoing  Bedrest    Problem: Risk for Impaired Skin Integrity  Goal: Tissue integrity - skin and mucous membranes  Structural intactness and normal physiological function of skin and  mucous membranes.   Outcome: Ongoing  Intervention: SKIN ASSESSMENT  Skin assessment complete. No changes      Problem: Pain:  Goal: Pain level will decrease  Pain level will decrease   Outcome: Ongoing  Goal: Control of acute pain  Control of acute pain   Outcome: Ongoing

## 2014-11-02 NOTE — Plan of Care (Signed)
Problem: Falls - Risk of  Goal: Absence of falls  Outcome: Ongoing  Client remains free from falls, bed/chair alarm in place, door open, encouraged to use call light for needs, call light is within reach, bed lock in lowest position,  Will continue to monitor.

## 2014-11-02 NOTE — Care Coordination-Inpatient (Signed)
Discharge Planning:    Order noted and chart reviewed.  Per chart, pt does not have insurance.  Spoke with Shirlean Mylar, pt financial advocate.  Shirlean Mylar reports pt is on her list to see.  Tora Duck

## 2014-11-02 NOTE — Progress Notes (Signed)
Pt HR 105, BP 97/65. Responds to voice and painful stimuli. Will continue to monitor

## 2014-11-03 ENCOUNTER — Inpatient Hospital Stay: Primary: Internal Medicine

## 2014-11-03 ENCOUNTER — Encounter

## 2014-11-03 LAB — CREATININE, RANDOM URINE
Creatinine, Ur: 33.2 mg/dL (ref 28.0–259.0)
Creatinine, Ur: 33.4 mg/dL (ref 28.0–259.0)

## 2014-11-03 LAB — BASIC METABOLIC PANEL
Anion Gap: 11 (ref 3–16)
BUN: 32 mg/dL — ABNORMAL HIGH (ref 7–20)
CO2: 28 mmol/L (ref 21–32)
Calcium: 12.8 mg/dL (ref 8.3–10.6)
Chloride: 99 mmol/L (ref 99–110)
Creatinine: 1.4 mg/dL — ABNORMAL HIGH (ref 0.6–1.1)
GFR African American: 48 — AB (ref >60)
GFR Non-African American: 39 — AB (ref >60)
Glucose: 147 mg/dL — ABNORMAL HIGH (ref 70–99)
Potassium: 4.7 mmol/L (ref 3.5–5.1)
Sodium: 138 mmol/L (ref 136–145)

## 2014-11-03 LAB — CBC WITH AUTO DIFFERENTIAL
Bands Relative: 16 % — ABNORMAL HIGH (ref 0–7)
Basophils %: 0 %
Basophils Absolute: 0 10*3/uL (ref 0.0–0.2)
Eosinophils %: 0 %
Eosinophils Absolute: 0 10*3/uL (ref 0.0–0.6)
Hematocrit: 26.9 % — ABNORMAL LOW (ref 36.0–48.0)
Hemoglobin: 8.4 g/dL — ABNORMAL LOW (ref 12.0–16.0)
Lymphocytes %: 4 %
Lymphocytes Absolute: 1.6 10*3/uL (ref 1.0–5.1)
MCH: 23 pg — ABNORMAL LOW (ref 26.0–34.0)
MCHC: 31.2 g/dL (ref 31.0–36.0)
MCV: 73.7 fL — ABNORMAL LOW (ref 80.0–100.0)
MPV: 9.9 fL (ref 5.0–10.5)
Metamyelocytes Relative: 9 % — AB
Monocytes %: 8 %
Monocytes Absolute: 3.1 10*3/uL — ABNORMAL HIGH (ref 0.0–1.3)
Myelocyte Percent: 2 % — AB
Neutrophils %: 61 %
Neutrophils Absolute: 34.6 10*3/uL — ABNORMAL HIGH (ref 1.7–7.7)
Platelets: 287 10*3/uL (ref 135–450)
RBC: 3.65 M/uL — ABNORMAL LOW (ref 4.00–5.20)
RDW: 14.7 % (ref 12.4–15.4)
WBC: 39.3 10*3/uL — ABNORMAL HIGH (ref 4.0–11.0)

## 2014-11-03 LAB — T4: T4, Total: 7.5 ug/dL (ref 4.5–10.9)

## 2014-11-03 LAB — POCT GLUCOSE
POC Glucose: 162 mg/dl — ABNORMAL HIGH (ref 70–99)
POC Glucose: 156 mg/dl — ABNORMAL HIGH (ref 70–99)
POC Glucose: 240 mg/dl — ABNORMAL HIGH (ref 70–99)
POC Glucose: 219 mg/dl — ABNORMAL HIGH (ref 70–99)

## 2014-11-03 LAB — POTASSIUM: Potassium: 4.9 mmol/L (ref 3.5–5.1)

## 2014-11-03 LAB — OSMOLALITY, URINE: Osmolality, Ur: 362 mOsm/kg — ABNORMAL LOW (ref 390–1070)

## 2014-11-03 LAB — T4, FREE: T4 Free: 1.2 ng/dL (ref 0.9–1.8)

## 2014-11-03 LAB — SODIUM, URINE, RANDOM: Sodium, Ur: 74 mmol/L

## 2014-11-03 LAB — TSH WITH REFLEX: TSH: 4.65 u[IU]/mL — ABNORMAL HIGH (ref 0.27–4.20)

## 2014-11-03 LAB — PROTEIN, URINE, RANDOM: Protein, Ur: 39 mg/dL — ABNORMAL HIGH (ref <12)

## 2014-11-03 LAB — POTASSIUM, URINE, RANDOM: Potassium, Ur: 31.1 mmol/L

## 2014-11-03 LAB — CHLORIDE, URINE, RANDOM: Chloride: 75 mmol/L

## 2014-11-03 MED ORDER — FUROSEMIDE 10 MG/ML IJ SOLN
10 MG/ML | Freq: Once | INTRAMUSCULAR | Status: AC
Start: 2014-11-03 — End: 2014-11-03
  Administered 2014-11-03: 15:00:00 40 mg via INTRAVENOUS

## 2014-11-03 MED ORDER — LORAZEPAM 0.5 MG PO TABS
0.5 MG | ORAL | Status: DC | PRN
Start: 2014-11-03 — End: 2014-11-03

## 2014-11-03 MED ORDER — METOPROLOL TARTRATE 1 MG/ML IV SOLN
1 MG/ML | Freq: Once | INTRAVENOUS | Status: AC
Start: 2014-11-03 — End: 2014-11-03
  Administered 2014-11-03: 10 mg via INTRAVENOUS

## 2014-11-03 MED ORDER — MAGNESIUM SULFATE IN D5W 10-5 MG/ML-% IV SOLN
10-5 MG/ML-% | INTRAVENOUS | Status: DC | PRN
Start: 2014-11-03 — End: 2014-11-06

## 2014-11-03 MED ORDER — SODIUM CHLORIDE 0.9 % IV SOLN
0.9 % | Freq: Once | INTRAVENOUS | Status: AC
Start: 2014-11-03 — End: 2014-11-03
  Administered 2014-11-03: 20:00:00 30 mg via INTRAVENOUS

## 2014-11-03 MED FILL — NORMAL SALINE FLUSH 0.9 % IV SOLN: 0.9 % | INTRAVENOUS | Qty: 10

## 2014-11-03 MED FILL — DOXYCYCLINE HYCLATE 100 MG PO CAPS: 100 MG | ORAL | Qty: 1

## 2014-11-03 MED FILL — FUROSEMIDE 10 MG/ML IJ SOLN: 10 MG/ML | INTRAMUSCULAR | Qty: 4

## 2014-11-03 MED FILL — SEROQUEL 25 MG PO TABS: 25 MG | ORAL | Qty: 2

## 2014-11-03 MED FILL — PAMIDRONATE DISODIUM 30 MG IV SOLR: 30 MG | INTRAVENOUS | Qty: 30

## 2014-11-03 MED FILL — OXYCODONE HCL 5 MG PO TABS: 5 MG | ORAL | Qty: 2

## 2014-11-03 MED FILL — AMIODARONE HCL 200 MG PO TABS: 200 MG | ORAL | Qty: 1

## 2014-11-03 MED FILL — LORAZEPAM 0.5 MG PO TABS: 0.5 MG | ORAL | Qty: 1

## 2014-11-03 MED FILL — LOVENOX 30 MG/0.3ML SC SOLN: 30 MG/0.3ML | SUBCUTANEOUS | Qty: 0.3

## 2014-11-03 MED FILL — ZOSYN 3-0.375 GM/50ML IV SOLN: INTRAVENOUS | Qty: 50

## 2014-11-03 MED FILL — SODIUM CHLORIDE 0.9 % IV SOLN: 0.9 % | INTRAVENOUS | Qty: 1000

## 2014-11-03 MED FILL — PANTOPRAZOLE SODIUM 40 MG PO TBEC: 40 MG | ORAL | Qty: 1

## 2014-11-03 MED FILL — METOPROLOL TARTRATE 1 MG/ML IV SOLN: 1 MG/ML | INTRAVENOUS | Qty: 10

## 2014-11-03 NOTE — Progress Notes (Signed)
Dr. Valarie Merino informed of HR. Orders in for cardizem to titrate down to 110, morphine iv added for pain. Will continue to monitor

## 2014-11-03 NOTE — Progress Notes (Signed)
Pt found laying prone in her bed, 2 RN assist to get her to her back and sitting up in bed. Pt has labored resp at 28/min. Other VSS

## 2014-11-03 NOTE — Significant Event (Addendum)
Hospital Medicine Event Note    Pt transferred to CVICU after rapid response for afib w/ RVR. Received verbal sign out from Night 1 MD that pt seen as rapid response for afib w/ RVR w/ rates to 150s associated w/ hypoxtension in the 80s. Pt w/ poor IV access. Initially ordered 0.5mg  IV digoxin, however noted spillage w/ attempted IV infusion. Pt seen at bedside w/ multiple nurses. Noted to be incoherent w/ sat 88% on 6L NC, HR 159 - variable, and sbp 80s. Ordered pt to be placed on 100% NRB. Hx of metastatic lung CA w/ verbal report of plan for family meeting w/ palliative care in AM. Pt remains full code at this time however. Attempted to reach pt's daughter for verbal consent for central line placement. Nursing unable to get her on the phone - had previously spoken to her regarding transfer from floor to CVU. Other family member attempting to reach her. Given imminent decompensation, decision made place emergent R femoral TLC in order to administer cardiac meds and fluids. Pt noted to have multiple presumed cutaneous meds in the groin and torso. Pt unable to safely cooperate w/ placement of IJ or subclavian TLC. Given 2mg  IM ativan and 4mg  IM morphine for sedation.    R femoral TLC subsequently placed. See procedure note for further details. Pt re-evaluated in the CVU. Noted to be NSR rate 90s w/ SBP 80s. Ordered 563ml NS bolus and low rate IV fluids. Will closely monitor respiratory status and for pulmonary edema.    1. As above  2. Monitor R groin site for hematoma  3. Additional mgmt as per critical care and primary attending.    Total critical care time separate from procedures including titration of therapies 35 mins

## 2014-11-03 NOTE — Procedures (Addendum)
Notified by cross cover MD that pt seen as rapid response for afib w/ RVR w/ rates to 150s associated w/ hypoxtension in the 80s. Attempted to reach pt's daughter for verbal consent for central line placement. Nursing unable to get her on the phone - had previously spoken to her regarding transfer from floor to CVU. Other family member attempting to reach her. Given imminent decompensation, decision made place emergent R femoral TLC in order to administer cardiac meds and fluids. Pt noted to have multiple presumed cutaneous meds in the groin and torso. Pt unable to safely cooperate w/ placement of IJ or subclavian TLC.     R groin prepped and draped in sterile fashion after appropriate time out and site identification. 1% lidocaine w/ epi approx 65ml used during procedure for local anesthesia. R femoral vein accessed on 3rd attempt using modified seldinger technique. Pressure held on first 2 attempt 2nd to femoral artery access. No hematoma noted. Dilator advanced over guide wire after needle removed and skin nick placed. Catheter advanced over guidewire and secured at approx 37ml. Butterfly placed and sutured after guidewire removed and catheter left in place. Catheter secured x 4 w/ 2-0 silk. Sterile dressing applied w/ biopatch. All ports flushed and withdrew w/o difficulty. Pt tolerated procedure w/o apparent complication. Given 5mg  IV metoprolol after conversion to NSR w/ rates to 150s.

## 2014-11-03 NOTE — Progress Notes (Signed)
Pt lethargic, responds to voice.  Tachypnea, lungs diminished, SpO2 98% on 2 liters nasal cannula.  Pt visibly using abdominal muscles.  No changes in previous assessment

## 2014-11-03 NOTE — Progress Notes (Signed)
NS bolus 500 ml completed, now running NS at 100/hr, SBP 90's at this time, will monitor.

## 2014-11-03 NOTE — Progress Notes (Signed)
Came in for shift, pt in rapid afib - cardizem was ordered - bp was in 80's, unable to start cardizem bolus/gtt, rapid called. IV infiltrated, multiple attempts to re start unsuccessful. Pt was transferred to CVU, this RN plus multiple day shift RNs (Kaitlin, Cj, [place], [place]) stayed to assist in CVU with line placement.

## 2014-11-03 NOTE — Progress Notes (Signed)
[facility] PROGRESS NOTE    11/03/2014 8:51 AM        Name: Valerie Thompson .              Admitted: 11/01/2014  Primary Care Provider: Darrick Huntsman, MD (Tel: [phone])      Subjective:   Patient seen and examined, she is resting with her eyes shut.  Patient states that she is not experiencing any pain other than in her right eye, she does not feel short of breath, she is unable to tell me about her diagnosis of cancer.    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  She was discharged from the hospital on 10/27/14, per the family's discussion with palliative care RN she was to follow-up with [facility] today.  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    Reviewed interval ancillary notes    Current Medications    dextrose 50 % solution 25 g PRN   glucose (GLUTOSE) 40 % oral gel 15 g PRN   dextrose 50 % solution 12.5 g PRN   glucagon (rDNA) injection 1 mg PRN   dextrose 5 % solution PRN   0.9 % sodium chloride infusion Continuous   0.9 % sodium chloride infusion Continuous   amiodarone (CORDARONE) tablet 200 mg Daily   docusate sodium (COLACE) capsule 100 mg BID   doxycycline (VIBRAMYCIN) capsule 100 mg BID   QUEtiapine (SEROQUEL) tablet 50 mg BID   polyethylene glycol (GLYCOLAX) packet 17 g Daily PRN   pantoprazole (PROTONIX) tablet 40 mg QAM AC   oxyCODONE (ROXICODONE) immediate release tablet 10 mg Q6H PRN   LORazepam (ATIVAN) tablet 0.5 mg Q12H PRN   sodium chloride flush 0.9 % injection 10 mL 2 times per day   sodium chloride flush 0.9 % injection 10 mL PRN   acetaminophen (TYLENOL) tablet 650 mg Q4H PRN   magnesium hydroxide (MILK OF MAGNESIA)  400 MG/5ML suspension 30 mL Daily PRN   ondansetron (ZOFRAN) injection 4 mg Q6H PRN   dextrose 50 % solution 25 g PRN   enoxaparin (LOVENOX) injection 30 mg Daily   piperacillin-tazobactam (ZOSYN) IVPB extended infusion 3.375 g Q8H       Objective:  BP 123/80 mmHg   Pulse 98   Temp(Src) 99 ??F (37.2 ??C) (Oral)   Resp 26   Ht 5\' 4"  (1.626 m)   Wt 299 lb (135.626 kg)   BMI 51.30 kg/m2   SpO2 98%    Intake/Output Summary (Last 24 hours) at 11/03/14 0851  Last data filed at 11/03/14 0320   Gross per 24 hour   Intake   2100 ml   Output   2510 ml   Net   -410 ml    Wt Readings from Last 3 Encounters:   11/01/14 299 lb (135.626 kg)       General appearance:  Appears uncomfortable  Eyes:  Patient is unable to open her right eye, refuses to let me examine it.  Her right eye is proptotic  ENT: Moist oral mucosa. Trachea midline, no adenopathy.  Cardiovascular: tachycardic, Regular rhythm, normal S1, S2. No murmur. No edema in lower extremities  Respiratory: Not using accessory muscles. Good inspiratory effort. Clear to auscultation bilaterally, no  wheeze or crackles.   GI: Abdomen soft, no tenderness, not distended, normal bowel sounds  Musculoskeletal: No cyanosis in digits, neck supple  Neurology: CN 2-12 grossly intact. No speech or motor deficits  Psych: Normal affect. Alert and oriented in time, place and person  Skin:   Skin is loose, there are multiple nodularities    Labs and Tests:  CBC:   Recent Labs      10/31/14   2155  11/02/14   0949  11/03/14   0503   WBC  27.0*  40.1*  39.3*   HGB  9.6*  9.3*  8.4*   PLT  147  246  287     BMP:  Recent Labs      11/02/14   0948  11/02/14   1719  11/03/14   0032  11/03/14   0503   NA  135*  136   --   138   K  6.2*  5.2*  4.9  4.7   CL  97*  99   --   99   CO2  24  28   --   28   BUN  28*  30*   --   32*   CREATININE  1.2*  1.3*   --   1.4*   GLUCOSE  175*  172*   --   147*     Hepatic: Recent Labs      10/31/14   2155   AST  32   ALT  12   BILITOT  <0.2   ALKPHOS  97        Problem List  Active Problems:    Hypoglycemia       Assessment & Plan:     Acute on chronic respiratory failure: Etiology is unclear.  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  Stage IV.  Poor prognosis.  -Oncology has been consulted  -Patient appears to have a poor performance status and I doubt that she is a candidate for additional chemotherapy or radiation at this point  -Palliative care is following, patient most likely better candidate for hospice care    Hyperkalemia: Etiology unclear however patient is improved with medical treatment  -Nephrology following, appreciate assistance    Leukocytosis: Etiology unclear, patient with significant bandemia, she is improving  -Continue antibiotics    Anemia: Likely due to marrow involvement  -No plan for transfusion    Acute renal failure: Patient's creatinine 1.4    Hypercalcemia, calcium 12.8  -Check ionized calcium  -Continue Lasix      Diet: DIET GENERAL; Low Potassium  Code:Full Code        [name], MD   11/03/2014 8:51 AM    NOTE: This note was created using Dragon voice recognition software.Every effort was made to ensure accuracy; however, inadvertent computerized transcription errors may be present.

## 2014-11-03 NOTE — Progress Notes (Addendum)
Dr paged about HR sustaining in 150's after receiving ativan and pain medication. Pt very restless, O2 94%, metoprolol given per dr order. Will cont to monitor. STAT EKG ordered, shows afib with RVR. MD to be notified.

## 2014-11-03 NOTE — Consults (Signed)
Palliative Care:     53 y.o. Female with history of metastatic lung cancer presents to ED with  low blood glucose, dyspnea. Patient has had recent falls and presents with confusion. Oncology and nephrology are following. Patient was recently diagnosed with Stage IV small cell lung cancer--(about 2 months ago) she has been receiving chemotherapy in [place]. Intake has been poor. Patient is on supplemental oxygen and antibiotics. Nephrology is following for AKI.     [facility] Pulmonary 11/02/14:  IMPRESSION:   There is no convincing evidence of acute pulmonary embolus. The study is   somewhat technically limited with regard to detection of a small or   peripheral embolus.      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 Innumerous metastatic nodules   noted within the body wall structures. Presumed metastatic disease involving   the adrenals. A metastatic nodule is seen posterior to the upper pole of the   right kidney.        .  10/31/2014 EXAMINATION: SINGLE VIEW OF THE CHEST 10/31/2014 10:26 pm COMPARISON: 06/22/2009 HISTORY: sob Shortness of Breath pt brought in via squad for SOB, pt's family called for low blood sugar--squad gave glucose tab and rechecked FSBS 74. pt stage 4 lung cancer. pt drove up 7 hrs from [employer] on Tuesday. Lung biopsy FINDINGS: There are no recent studies for comparison in this patient with history of lung cancer. There is opacification of the inferior 1/3 to 1/2 of the right hemithorax that may represent a combination of airspace disease, mass and/or pleural fluid. There is a small band of atelectasis in the lingula. Left lung is otherwise grossly clear. Study was obtained at low lung volumes. No evidence of a pneumothorax. Heart size is upper limits of normal. Study is limited by obesity.     10/31/2014 IMPRESSION: Opacification of the inferior right hemithorax as above. If there are prior studies,  comparison would be helpful.     Past Medical History:      Acute respiratory failure with hypercapnia ([place]); Diabetes mellitus ([place]); Hypertension; Leukocytosis; Nausea and vomiting; PAF (paroxysmal atrial fibrillation) ([place]); Pedal edema; SVT (supraventricular tachycardia); UTI (urinary tract infection); VT (ventricular tachycardia) ([facility]); DVT (deep vein thrombosis) in pregnancy; Factor 5 Leiden mutation, heterozygous ([place]); Lung cancer ([place]); and Metastatic disease ([place]).     Past Surgical History: Lung biopsy; Cholecystectomy; and other surgical history.     Advance Directives:      Code status is full    Problem Severity: Pain/Other Symptoms:      Has neoplastic pain and has been on Oxycodone IR.     Bed Mobility/Toileting/Transfer:      Independent with ADLs.    Performance Status:      Ambulates without an assistive device    Symptom Assessment: Appetite/Nausea/Bowels/Fatigue:      Patient has had poor appetite.    Psychological/Spiritual:        Patient lives with daughter Inda Merlin and her family. Ex-spouse is involved. Patient has three sons.  Patient has faith and it is important to her.     Family Discussion:      Met with patient, she is very lethargic. Spoke with daughter Inda Merlin with whom the patient lives. She understands her mother has advanced small cell lung cancer and understands she has multiple metastatic lesions to kidney, adrenal glands, right eye, brain. States she did speak with medical staff in [place], and is aware of stage IV  cancer but states she was not told prognosis only plans for radiation and chemotherapy treatments. She did have an appointment at [facility] today. Daughter states she is most involved and supportive. She would like a family meeting tomorrow with her siblings--would like to know prognosis. Meeting is set up for 1600 tomorrow. Support given. May be most appropriate for comfort care.     Plan:    Family meeting at 78. Will ask daughter if she would like  support from [facility].

## 2014-11-03 NOTE — Progress Notes (Addendum)
[facility]  Nephrology Consult Note  [phone]  [phone]   [email or web address]    Patient:  Valerie Thompson   DOB: 06-10-1961    CC:  Hypoglycemia    HPI: The patient is a 53 y.o. female with significant past medical history of metastastic lung Ca , HTN, paroxysmal A. Fib, factor 5 Leiden mutation, DM who was admitted for hypoglycemia one day prior. She has recently moved her from [place] to be close to family. K was noted to be 6.1 on admission. Scr is 1.2. She is not on RAAS blockade or K supplements at home. She is on metformin for DM.     Subjective:  -pt seen and examined  -PMSHX and meds reviewed  -No family at bedside    Sitting up on commode  K is improved  Scr is trending aup  Remains confused and lethargic  Plan for MRI of brain  BP is improved      ROS:   Limited due to patient factors        Meds:  Current Facility-Administered Medications   Medication Dose Route Frequency Provider Last Rate Last Dose   ??? dextrose 50 % solution 25 g  25 g Intravenous PRN Neville Route, MD       ??? glucose (GLUTOSE) 40 % oral gel 15 g  15 g Oral PRN Chalana Alleen Borne, MD       ??? dextrose 50 % solution 12.5 g  12.5 g Intravenous PRN Neville Route, MD       ??? glucagon (rDNA) injection 1 mg  1 mg Intramuscular PRN Neville Route, MD       ??? dextrose 5 % solution  100 mL/hr Intravenous PRN Neville Route, MD       ??? 0.9 % sodium chloride infusion   Intravenous Continuous Estevan Kersh Patel-Chamberlin, MD 75 mL/hr at 11/02/14 1208     ??? 0.9 % sodium chloride infusion   Intravenous Continuous Neville Route, MD 999 mL/hr at 11/02/14 1433     ??? amiodarone (CORDARONE) tablet 200 mg  200 mg Oral Daily Westley Hummer, MD   200 mg at 11/02/14 1029   ??? docusate sodium (COLACE) capsule 100 mg  100 mg Oral BID Westley Hummer, MD   100 mg at 11/02/14 1027   ??? doxycycline (VIBRAMYCIN) capsule 100 mg  100 mg Oral BID Westley Hummer, MD   100  mg at 11/02/14 2052   ??? QUEtiapine (SEROQUEL) tablet 50 mg  50 mg Oral BID Westley Hummer, MD   50 mg at 11/02/14 1028   ??? polyethylene glycol (GLYCOLAX) packet 17 g  17 g Oral Daily PRN Westley Hummer, MD       ??? pantoprazole (PROTONIX) tablet 40 mg  40 mg Oral QAM AC Christian Moreen Fowler, MD   40 mg at 11/03/14 2440   ??? oxyCODONE (ROXICODONE) immediate release tablet 10 mg  10 mg Oral Q6H PRN Westley Hummer, MD   10 mg at 11/03/14 0419   ??? LORazepam (ATIVAN) tablet 0.5 mg  0.5 mg Oral Q12H PRN Westley Hummer, MD   0.5 mg at 11/03/14 0419   ??? sodium chloride flush 0.9 % injection 10 mL  10 mL Intravenous 2 times per day Westley Hummer, MD   10 mL at 11/01/14 2005   ??? sodium chloride flush 0.9 % injection 10 mL  10 mL Intravenous PRN Westley Hummer,  MD       ??? acetaminophen (TYLENOL) tablet 650 mg  650 mg Oral Q4H PRN Westley Hummer, MD       ??? magnesium hydroxide (MILK OF MAGNESIA) 400 MG/5ML suspension 30 mL  30 mL Oral Daily PRN Westley Hummer, MD       ??? ondansetron (ZOFRAN) injection 4 mg  4 mg Intravenous Q6H PRN Westley Hummer, MD       ??? dextrose 50 % solution 25 g  25 g Intravenous PRN Tama Gander, MD   25 g at 11/01/14 3664   ??? enoxaparin (LOVENOX) injection 30 mg  30 mg Subcutaneous Daily Neville Route, MD   30 mg at 11/02/14 1029   ??? piperacillin-tazobactam (ZOSYN) IVPB extended infusion 3.375 g  3.375 g Intravenous Q8H Chalana U Gunawardena, MD 12.5 mL/hr at 11/03/14 0223 3.375 g at 11/03/14 0223       Vitals:  BP 123/80 mmHg   Pulse 98   Temp(Src) 99 ??F (37.2 ??C) (Oral)   Resp 24   Ht 5\' 4"  (1.626 m)   Wt 299 lb (135.626 kg)   BMI 51.30 kg/m2   SpO2 97%    Physical Exam:  Gen: morbidly obesedifficult to arouse, lethargic   HEENT: anciteric sclera  CV: RRR no m/r/g. No S3.  Lungs: scattered rhonchi, no resp distress  Abd: S/NT +BS, obese  Ext: +edema, no cyanosis  Skin: Warm. No rashes  appreciated.  GU: foley in place  Neuro: asleep, difficult to arouse, non-focal  Joints: No erythema noted over joints.  Labs:  CBC with Differential:    Lab Results   Component Value Date    WBC 39.3 11/03/2014    RBC 3.65 11/03/2014    HGB 8.4 11/03/2014    HCT 26.9 11/03/2014    PLT 287 11/03/2014    MCV 73.7 11/03/2014    MCH 23.0 11/03/2014    MCHC 31.2 11/03/2014    RDW 14.7 11/03/2014    SEGSPCT 61.0 06/22/2009    BANDSPCT 16 11/03/2014    METASPCT 9 11/03/2014    LYMPHOPCT 4.0 11/03/2014    MONOPCT 8.0 11/03/2014    MYELOPCT 2 11/03/2014    EOSPCT 2.0 06/22/2009    BASOPCT 0.0 11/03/2014    MONOSABS 3.1 11/03/2014    LYMPHSABS 1.6 11/03/2014    EOSABS 0.0 11/03/2014    BASOSABS 0.0 11/03/2014    DIFFTYPE Manual-K 06/22/2009     BMP:    Lab Results   Component Value Date    NA 138 11/03/2014    K 4.7 11/03/2014    CL 99 11/03/2014    CO2 28 11/03/2014    BUN 32 11/03/2014    LABALBU 2.8 10/31/2014    CREATININE 1.4 11/03/2014    CALCIUM 12.8 11/03/2014    GFRAA 48 11/03/2014    GFRAA >60 06/22/2009    LABGLOM 39 11/03/2014    GLUCOSE 147 11/03/2014       Assessment/Plan:    IMPRESSION/RECOMMENDATIONS:   Hyperkalemia: UA with e/o rhadbo-large blood with only 3-5 RBC/? 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 Tumor lysis-she is on prednisone  -pt not on supplements/RAAS blockade  -CK not elevated  -s/p lasix and kayexalate  -K is improved to 4.7  -at present no indication for dialysis-monitor closely regarding need    AMS:  -remains lethargic  -blood gas noted  -d/c gabapentin/valium for now  -would hold ativan     Hypercalcemia:  -  2/2 underlying lung malignancy-contributing to AMS  -continue IVF with lasix  -stopped HCTZ  -PTH is suppressed, vitamin D is low-will not replete  -will give pamidronate 30mg     AKI:  -most likely pre-renal/CIN  -will check urine lytes  -hold off on renal US for now unless worsening    Leukcocytosis:  -blood cultures done  -on zosyn/doxy  -per  primary    Anemia:  -transfuse prn    Hypotension:  -sepsis vs adrenal insufficiency vs pericardial effusion  -continue IVF  -currently stable    Metastatic lung Ca:  -per oncology  -recent chemo per records    Alba Cory, MD  11/03/2014

## 2014-11-03 NOTE — Progress Notes (Signed)
Pt very restless tonight, turning/rolling in bed getting foley/IV tubing tangled. Pt is very weak and has a difficult time rolling over to her back. Oxy IR and ativan given at this time per orders. Pt stating "I can't do this anymore". Repositioned and bed alarm engaged, camera in room now.

## 2014-11-03 NOTE — Progress Notes (Signed)
Oncology and Hematology Care   Progress Note    Subjective:  States she is not having any pain, breathing is better, + feeling very poorly, + weakness, + feeling irritable.      Objective:  Medications:  ??? pamidronate (AREDIA) IVPB  30 mg Intravenous Once   ??? amiodarone  200 mg Oral Daily   ??? docusate sodium  100 mg Oral BID   ??? doxycycline  100 mg Oral BID   ??? QUEtiapine  50 mg Oral BID   ??? pantoprazole  40 mg Oral QAM AC   ??? sodium chloride flush  10 mL Intravenous 2 times per day   ??? enoxaparin  30 mg Subcutaneous Daily   ??? piperacillin-tazobactam  3.375 g Intravenous Q8H        Physical Exam:  Vitals:  BP 105/70 mmHg   Pulse 108   Temp(Src) 97.2 ??F (36.2 ??C) (Temporal)   Resp 22   Ht 5\' 4"  (1.626 m)   Wt 299 lb (135.626 kg)   BMI 51.30 kg/m2   SpO2 100%    General:  A/O x 3, ill appearing  HEENT: Swollen, Rt eye w/ proptosis, neck supple   Lymph: No cervical, supraclavicular LAD  Resp: Distant BS, mild resp distress  Cardiovascular: Tachy, reg  GI: Soft, obese, + BS  Skin: No jaundice, no petechiae  MSK: negative for pain and muscle weakness  Extremities: No BLE edema, no atrophy  Psych: No confusion, no agitation    Labs Results:    CBC:   Recent Labs      10/31/14   2155  11/02/14   0949  11/03/14   0503   WBC  27.0*  40.1*  39.3*   HGB  9.6*  9.3*  8.4*   HCT  30.8*  29.6*  26.9*   MCV  74.0*  73.7*  73.7*   PLT  147  246  287     BMP:   Recent Labs      11/02/14   0948  11/02/14   1719  11/03/14   0032  11/03/14   0503   NA  135*  136   --   138   K  6.2*  5.2*  4.9  4.7   CL  97*  99   --   99   CO2  24  28   --   28   PHOS  4.8  5.1*   --    --    BUN  28*  30*   --   32*   CREATININE  1.2*  1.3*   --   1.4*     LIVER PROFILE:   Recent Labs      10/31/14   2155   AST  32   ALT  12   BILITOT  <0.2   ALKPHOS  97     PT/INR:    Lab Results   Component Value Date    PROTIME 14.7 10/31/2014    INR 1.28 10/31/2014       Investigations Reviewed:  CTPA:     There is no convincing evidence of acute pulmonary  embolus. The study is somewhat technically limited with regard to detection of a small or peripheral embolus.    Multiple intrapulmonary presumed malignant masses with extensive mediastinal, paratracheal and subcarinal lymphadenopathy. Innumerous metastatic nodules  noted within the body wall structures. Presumed metastatic disease involving the adrenals. A metastatic nodule is seen posterior to the upper  pole of the right kidney.    Assessment/Plan:   Squamous Cell Lung Cancer - Widely metastatic, s/p carbo/gem, tol poorly, poor performance status (ECOG: 4). Not a candidate for further chemotherapy.  Very poor prognosis, recommend aggressive best supportive care/hospice.  D/w pt and pt requested I call and inform her daughter, Inda Merlin of this, called and left message for her daughter with whom she lives [phone]).      Anxiety - Inc frequency of ativan to 0.5    I have discussed the above stated plan with the patient and they verbalized understanding and agreed with the plan. Thank you for allowing [place] to participate in this patients care.    Janus Molder, MD  Hematology/Oncology  [phone]

## 2014-11-03 NOTE — Plan of Care (Signed)
Problem: Falls - Risk of  Goal: Absence of falls  Outcome: Ongoing  Bed alarm in place, wheels locked, bed in lowest position, SAFE on door, orange blanket on bed and fall ID band in place. Call light/bedside table in reach. Will continue to monitor.        Problem: Risk for Impaired Skin Integrity  Goal: Tissue integrity - skin and mucous membranes  Structural intactness and normal physiological function of skin and  mucous membranes.   Outcome: Ongoing  Skin is clean/dry/intact - has been incontinent of bowel since kayexelate, but will call when she needs to be changed.

## 2014-11-03 NOTE — Progress Notes (Signed)
EKG done. Results given to nurse.

## 2014-11-03 NOTE — Progress Notes (Signed)
Pt brought emergently via stretcher to CVU room 2914 from 3A, attached to tele, Pt rapid A Fib, Dr. Effie Berkshire here for central line placement due to no IV access at this time, will monitor.

## 2014-11-03 NOTE — Progress Notes (Addendum)
Patients morning assessment completed, VSS; see flowsheets. Tachypnea, use of accessory muscles when breathing O2 sat at 98% with nasal cannula. Patient lethargic, but responds to voice and painful stimuli. Patient in bed eyes closed with no acute distress noted.  Call light within reach, will continue to monitor.

## 2014-11-03 NOTE — Progress Notes (Signed)
Dr. Effie Berkshire placed central line in R femoral artery, Morphine and Ativan given IM per verbal order during procedure, pt tolerated well, will monitor.

## 2014-11-03 NOTE — Progress Notes (Signed)
After central line placement, pt received Lopressor 5mg  IV for rapid A Fib, pt converted to NSR, will monitor.

## 2014-11-03 NOTE — Progress Notes (Signed)
Palliative Care:     Family meeting planned for 1600 tomorrow with daughter and sons, palliative care, Dr Santiago Glad. Oncology recommends hospice care.

## 2014-11-03 NOTE — Unmapped (Signed)
[facility]  Department of Internal Medicine  [facility]   New Patient Note    Identification   Patient: Kathy Camacho   MRN: [id number]     HPI   Kathy Camacho is a 53 y.o. female with significant PMHx of     NO SHOW        ROS     ROS      History   No past medical history on file.  No past surgical history on file.  No family history on file.  History     Social History   ??? Marital Status: Married     Spouse Name: N/A     Number of Children: N/A   ??? Years of Education: N/A     Social History Main Topics   ??? Smoking status: Not on file   ??? Smokeless tobacco: Not on file   ??? Alcohol Use: Not on file   ??? Drug Use: Not on file   ??? Sexual Activity: Not on file     Other Topics Concern   ??? Not on file     Social History Narrative   ??? No narrative on file     Allergies not on file    Medications     No current outpatient prescriptions on file.     No current facility-administered medications for this visit.       Physical Examination   There were no vitals taken for this visit.    Wt Readings from Last 2 Encounters:   No data found for Wt     There is no height or weight on file to calculate BMI.    Physical Exam exam    Labs   No results found for: WBC, HGB, HCT, MCV, PLT  No results found for: NA, K, CO2, CL, BUN, CREATININE  No results found for: GLUCOSE, CALCIUM   No results found for: PROT, ALBUMIN, BILITOT, ALKPHOS, AST, ALT  No results found for: PTT, INR      Assessment & Plan   Kathy Camacho is a 53 y.o. female with significant PMHx     Problem List Items Addressed This Visit     None

## 2014-11-04 LAB — RENAL FUNCTION PANEL
Albumin: 2.6 g/dL — ABNORMAL LOW (ref 3.4–5.0)
Albumin: 2.6 g/dL — ABNORMAL LOW (ref 3.4–5.0)
Anion Gap: 10 (ref 3–16)
Anion Gap: 11 (ref 3–16)
BUN: 32 mg/dL — ABNORMAL HIGH (ref 7–20)
BUN: 33 mg/dL — ABNORMAL HIGH (ref 7–20)
CO2: 29 mmol/L (ref 21–32)
CO2: 29 mmol/L (ref 21–32)
Calcium: 13.4 mg/dL (ref 8.3–10.6)
Calcium: 13.6 mg/dL (ref 8.3–10.6)
Chloride: 101 mmol/L (ref 99–110)
Chloride: 101 mmol/L (ref 99–110)
Creatinine: 1.3 mg/dL — ABNORMAL HIGH (ref 0.6–1.1)
Creatinine: 1.2 mg/dL — ABNORMAL HIGH (ref 0.6–1.1)
GFR African American: 52 — AB (ref >60)
GFR African American: 57 — AB (ref >60)
GFR Non-African American: 43 — AB (ref >60)
GFR Non-African American: 47 — AB (ref >60)
Glucose: 192 mg/dL — ABNORMAL HIGH (ref 70–99)
Glucose: 194 mg/dL — ABNORMAL HIGH (ref 70–99)
Phosphorus: 5.3 mg/dL — ABNORMAL HIGH (ref 2.5–4.9)
Phosphorus: 4.9 mg/dL (ref 2.5–4.9)
Potassium: 4.6 mmol/L (ref 3.5–5.1)
Potassium: 4.7 mmol/L (ref 3.5–5.1)
Sodium: 140 mmol/L (ref 136–145)
Sodium: 141 mmol/L (ref 136–145)

## 2014-11-04 LAB — CBC WITH AUTO DIFFERENTIAL
Bands Relative: 10 % — ABNORMAL HIGH (ref 0–7)
Basophils %: 0 %
Basophils Absolute: 0 10*3/uL (ref 0.0–0.2)
Eosinophils %: 0 %
Eosinophils Absolute: 0 10*3/uL (ref 0.0–0.6)
Hematocrit: 25.8 % — ABNORMAL LOW (ref 36.0–48.0)
Hemoglobin: 7.9 g/dL — ABNORMAL LOW (ref 12.0–16.0)
Lymphocytes %: 6 %
Lymphocytes Absolute: 2.8 10*3/uL (ref 1.0–5.1)
MCH: 23.2 pg — ABNORMAL LOW (ref 26.0–34.0)
MCHC: 30.8 g/dL — ABNORMAL LOW (ref 31.0–36.0)
MCV: 75.3 fL — ABNORMAL LOW (ref 80.0–100.0)
MPV: 10 fL (ref 5.0–10.5)
Metamyelocytes Relative: 1 % — AB
Monocytes %: 10 %
Monocytes Absolute: 4.7 10*3/uL — ABNORMAL HIGH (ref 0.0–1.3)
Myelocyte Percent: 1 % — AB
Neutrophils %: 72 %
Neutrophils Absolute: 39.5 10*3/uL — ABNORMAL HIGH (ref 1.7–7.7)
PLATELET SLIDE REVIEW: ADEQUATE
Platelets: 306 10*3/uL (ref 135–450)
RBC: 3.42 M/uL — ABNORMAL LOW (ref 4.00–5.20)
RDW: 14.9 % (ref 12.4–15.4)
WBC: 47 10*3/uL (ref 4.0–11.0)

## 2014-11-04 LAB — EKG 12-LEAD
Atrial Rate: 106 {beats}/min
Atrial Rate: 109 {beats}/min
Atrial Rate: 141 {beats}/min
P Axis: 43 degrees
P Axis: 59 degrees
P-R Interval: 138 ms
P-R Interval: 142 ms
Q-T Interval: 296 ms
Q-T Interval: 314 ms
Q-T Interval: 316 ms
QRS Duration: 78 ms
QRS Duration: 84 ms
QRS Duration: 86 ms
QTc Calculation (Bazett): 417 ms
QTc Calculation (Bazett): 425 ms
QTc Calculation (Bazett): 437 ms
R Axis: 101 degrees
R Axis: 101 degrees
R Axis: 93 degrees
T Axis: 33 degrees
T Axis: 55 degrees
T Axis: 79 degrees
Ventricular Rate: 106 {beats}/min
Ventricular Rate: 109 {beats}/min
Ventricular Rate: 131 {beats}/min

## 2014-11-04 LAB — POCT GLUCOSE
POC Glucose: 203 mg/dl — ABNORMAL HIGH (ref 70–99)
POC Glucose: 204 mg/dl — ABNORMAL HIGH (ref 70–99)
POC Glucose: 194 mg/dl — ABNORMAL HIGH (ref 70–99)
POC Glucose: 211 mg/dl — ABNORMAL HIGH (ref 70–99)
POC Glucose: 224 mg/dl — ABNORMAL HIGH (ref 70–99)

## 2014-11-04 LAB — URINALYSIS
Bilirubin Urine: NEGATIVE
Glucose, Ur: NEGATIVE mg/dL
Ketones, Urine: NEGATIVE mg/dL
Nitrite, Urine: NEGATIVE
Protein, UA: 30 mg/dL — AB
Specific Gravity, UA: 1.016 (ref 1.005–1.030)
Urobilinogen, Urine: 0.2 E.U./dL (ref <2.0)
pH, UA: 5 (ref 5.0–8.0)

## 2014-11-04 LAB — MICROSCOPIC URINALYSIS
Epithelial Cells, UA: 9 /HPF — ABNORMAL HIGH (ref 0–5)
RBC, UA: 25 /HPF — ABNORMAL HIGH (ref 0–4)
WBC, UA: 39 /HPF — ABNORMAL HIGH (ref 0–5)

## 2014-11-04 LAB — TROPONIN
Troponin: <0.01 ng/mL (ref <0.01)
Troponin: 0.01 ng/mL (ref <0.01)
Troponin: <0.01 ng/mL (ref <0.01)

## 2014-11-04 LAB — MAGNESIUM
Magnesium: 2 mg/dL (ref 1.80–2.40)
Magnesium: 1.9 mg/dL (ref 1.80–2.40)

## 2014-11-04 LAB — LACTIC ACID: Lactic Acid: 2.4 mmol/L — ABNORMAL HIGH (ref 0.4–2.0)

## 2014-11-04 LAB — CREATININE, RANDOM URINE: Creatinine, Ur: 62.7 mg/dL (ref 28.0–259.0)

## 2014-11-04 LAB — CHLORIDE, URINE, RANDOM: Chloride: 23 mmol/L

## 2014-11-04 LAB — SODIUM, URINE, RANDOM: Sodium, Ur: 25 mmol/L

## 2014-11-04 MED ORDER — LORAZEPAM 2 MG/ML IJ SOLN
2 MG/ML | INTRAMUSCULAR | Status: DC | PRN
Start: 2014-11-04 — End: 2014-11-06
  Administered 2014-11-04 – 2014-11-06 (×11): 1 mg via INTRAVENOUS

## 2014-11-04 MED ORDER — LIDOCAINE HCL (PF) 1 % IJ SOLN
1 % | INTRAMUSCULAR | Status: AC
Start: 2014-11-04 — End: 2014-11-04

## 2014-11-04 MED ORDER — LORAZEPAM 2 MG/ML IJ SOLN
2 MG/ML | Freq: Once | INTRAMUSCULAR | Status: DC
Start: 2014-11-04 — End: 2014-11-06

## 2014-11-04 MED ORDER — DILTIAZEM HCL 25 MG/5ML IV SOLN
25 MG/5ML | Freq: Once | INTRAVENOUS | Status: DC
Start: 2014-11-04 — End: 2014-11-03

## 2014-11-04 MED ORDER — POLYVINYL ALCOHOL 1.4 % OP SOLN
1.4 % | OPHTHALMIC | Status: DC | PRN
Start: 2014-11-04 — End: 2014-11-06

## 2014-11-04 MED ORDER — DEXTROSE 5 % IV SOLN
5 % | INTRAVENOUS | Status: DC
Start: 2014-11-04 — End: 2014-11-03

## 2014-11-04 MED ORDER — ACETAMINOPHEN 325 MG PO TABS
325 MG | ORAL | Status: DC | PRN
Start: 2014-11-04 — End: 2014-11-06

## 2014-11-04 MED ORDER — METOPROLOL TARTRATE 1 MG/ML IV SOLN
1 MG/ML | INTRAVENOUS | Status: AC
Start: 2014-11-04 — End: 2014-11-03
  Administered 2014-11-04: 04:00:00 5

## 2014-11-04 MED ORDER — LORAZEPAM 2 MG/ML IJ SOLN
2 MG/ML | INTRAMUSCULAR | Status: AC
Start: 2014-11-04 — End: 2014-11-04

## 2014-11-04 MED ORDER — DEXTROSE 5 % IV SOLN
5 % | Freq: Two times a day (BID) | INTRAVENOUS | Status: DC
Start: 2014-11-04 — End: 2014-11-04
  Administered 2014-11-04 (×2): 100 mg via INTRAVENOUS

## 2014-11-04 MED ORDER — SODIUM CHLORIDE 0.9 % IV SOLN
0.9 % | INTRAVENOUS | Status: AC
Start: 2014-11-04 — End: 2014-11-04

## 2014-11-04 MED ORDER — SODIUM CHLORIDE 0.9 % IV SOLN
0.9 % | Freq: Once | INTRAVENOUS | Status: DC
Start: 2014-11-04 — End: 2014-11-05

## 2014-11-04 MED ORDER — SODIUM CHLORIDE 0.9 % IV SOLN
0.9 % | INTRAVENOUS | Status: DC
Start: 2014-11-04 — End: 2014-11-04

## 2014-11-04 MED ORDER — MORPHINE SULFATE (PF) 2 MG/ML IV SOLN
2 MG/ML | INTRAVENOUS | Status: DC | PRN
Start: 2014-11-04 — End: 2014-11-06
  Administered 2014-11-04 (×2): 2 mg via INTRAVENOUS
  Administered 2014-11-04: 01:00:00 4 mg via INTRAVENOUS
  Administered 2014-11-04 (×3): 2 mg via INTRAVENOUS

## 2014-11-04 MED ORDER — INSULIN LISPRO 100 UNIT/ML SC SOPN
100 UNIT/ML | SUBCUTANEOUS | Status: DC
Start: 2014-11-04 — End: 2014-11-06
  Administered 2014-11-04 – 2014-11-05 (×3): 2 [IU] via SUBCUTANEOUS
  Administered 2014-11-06: 03:00:00 3 [IU] via SUBCUTANEOUS

## 2014-11-04 MED ORDER — DIGOXIN 0.25 MG/ML IJ SOLN
0.25 MG/ML | Freq: Once | INTRAMUSCULAR | Status: DC
Start: 2014-11-04 — End: 2014-11-03

## 2014-11-04 MED ORDER — SODIUM CHLORIDE 0.9 % IV BOLUS
0.9 % | Freq: Once | INTRAVENOUS | Status: DC
Start: 2014-11-04 — End: 2014-11-05

## 2014-11-04 MED ORDER — METOPROLOL TARTRATE 1 MG/ML IV SOLN
1 MG/ML | Freq: Four times a day (QID) | INTRAVENOUS | Status: DC | PRN
Start: 2014-11-04 — End: 2014-11-06

## 2014-11-04 MED ORDER — PHENYLEPHRINE HCL 10 MG/ML IJ SOLN
10 MG/ML | INTRAMUSCULAR | Status: DC
Start: 2014-11-04 — End: 2014-11-04

## 2014-11-04 MED ORDER — ACETAMINOPHEN 650 MG RE SUPP
650 MG | RECTAL | Status: DC | PRN
Start: 2014-11-04 — End: 2014-11-05

## 2014-11-04 MED ORDER — SODIUM CHLORIDE 0.9 % IV BOLUS
0.9 % | Freq: Once | INTRAVENOUS | Status: AC
Start: 2014-11-04 — End: 2014-11-03
  Administered 2014-11-04: 03:00:00 1000 mL via INTRAVENOUS

## 2014-11-04 MED ORDER — PROMETHAZINE HCL 25 MG RE SUPP
25 MG | RECTAL | Status: DC | PRN
Start: 2014-11-04 — End: 2014-11-06

## 2014-11-04 MED ORDER — METOPROLOL TARTRATE 1 MG/ML IV SOLN
1 MG/ML | Freq: Once | INTRAVENOUS | Status: AC
Start: 2014-11-04 — End: 2014-11-03
  Administered 2014-11-04: 02:00:00 5 mg via INTRAVENOUS

## 2014-11-04 MED ORDER — DIGOXIN 0.25 MG/ML IJ SOLN
0.25 MG/ML | Freq: Once | INTRAMUSCULAR | Status: AC
Start: 2014-11-04 — End: 2014-11-03
  Administered 2014-11-04: 01:00:00 0.5 mg via INTRAVENOUS

## 2014-11-04 MED ORDER — VANCOMYCIN HCL 1000 MG IV SOLR
1000 MG | Freq: Two times a day (BID) | INTRAVENOUS | Status: DC
Start: 2014-11-04 — End: 2014-11-04

## 2014-11-04 MED ORDER — HYDROMORPHONE HCL 1 MG/ML IJ SOLN
1 MG/ML | INTRAMUSCULAR | Status: DC | PRN
Start: 2014-11-04 — End: 2014-11-06
  Administered 2014-11-04 – 2014-11-06 (×12): 1 mg via INTRAVENOUS

## 2014-11-04 MED ORDER — LORAZEPAM 2 MG/ML IJ SOLN
2 MG/ML | INTRAMUSCULAR | Status: DC | PRN
Start: 2014-11-04 — End: 2014-11-04
  Administered 2014-11-04 (×2): 1 mg via INTRAVENOUS

## 2014-11-04 MED FILL — METOPROLOL TARTRATE 1 MG/ML IV SOLN: 1 MG/ML | INTRAVENOUS | Qty: 5

## 2014-11-04 MED FILL — LORAZEPAM 2 MG/ML IJ SOLN: 2 MG/ML | INTRAMUSCULAR | Qty: 1

## 2014-11-04 MED FILL — HUMALOG KWIKPEN 100 UNIT/ML SC SOPN: 100 UNIT/ML | SUBCUTANEOUS | Qty: 3

## 2014-11-04 MED FILL — DOXYCYCLINE HYCLATE 100 MG IV SOLR: 100 MG | INTRAVENOUS | Qty: 100

## 2014-11-04 MED FILL — MORPHINE SULFATE (PF) 2 MG/ML IV SOLN: 2 mg/mL | INTRAVENOUS | Qty: 2

## 2014-11-04 MED FILL — PHENYLEPHRINE HCL 10 MG/ML IJ SOLN: 10 MG/ML | INTRAMUSCULAR | Qty: 5

## 2014-11-04 MED FILL — SODIUM CHLORIDE 0.9 % IV SOLN: 0.9 % | INTRAVENOUS | Qty: 1000

## 2014-11-04 MED FILL — DIGOXIN 0.25 MG/ML IJ SOLN: 0.25 MG/ML | INTRAMUSCULAR | Qty: 2

## 2014-11-04 MED FILL — LIDOCAINE HCL (PF) 1 % IJ SOLN: 1 % | INTRAMUSCULAR | Qty: 30

## 2014-11-04 MED FILL — NORMAL SALINE FLUSH 0.9 % IV SOLN: 0.9 % | INTRAVENOUS | Qty: 10

## 2014-11-04 MED FILL — MORPHINE SULFATE (PF) 2 MG/ML IV SOLN: 2 mg/mL | INTRAVENOUS | Qty: 1

## 2014-11-04 MED FILL — ZOSYN 3-0.375 GM/50ML IV SOLN: INTRAVENOUS | Qty: 100

## 2014-11-04 MED FILL — HYDROMORPHONE HCL 1 MG/ML IJ SOLN: 1 MG/ML | INTRAMUSCULAR | Qty: 1

## 2014-11-04 MED FILL — VANCOMYCIN HCL 1000 MG IV SOLR: 1000 MG | INTRAVENOUS | Qty: 1.25

## 2014-11-04 MED FILL — SODIUM CHLORIDE 0.9 % IV SOLN: 0.9 % | INTRAVENOUS | Qty: 500

## 2014-11-04 MED FILL — NORMAL SALINE FLUSH 0.9 % IV SOLN: 0.9 % | INTRAVENOUS | Qty: 30

## 2014-11-04 MED FILL — ZOSYN 3-0.375 GM/50ML IV SOLN: INTRAVENOUS | Qty: 50

## 2014-11-04 MED FILL — DILTIAZEM HCL 125 MG/25ML IV SOLN: 125 MG/25ML | INTRAVENOUS | Qty: 25

## 2014-11-04 MED FILL — NORMAL SALINE FLUSH 0.9 % IV SOLN: 0.9 % | INTRAVENOUS | Qty: 40

## 2014-11-04 MED FILL — LOVENOX 30 MG/0.3ML SC SOLN: 30 MG/0.3ML | SUBCUTANEOUS | Qty: 0.3

## 2014-11-04 NOTE — Progress Notes (Signed)
Nurse at bedside with family and patient has become restless and on talking with patient patient was able to state to nurse that she had pain.  Nurse told patient that she was going to give her something for the pain and patient stated "ok."  Patient was unable to verbally tell nurse where she was hurting.

## 2014-11-04 NOTE — Progress Notes (Signed)
Multiple critical lab values noted.  Extra blood was sent to lab this morning on hold at 0435 with troponin level per night shift nurse.  Labs were ordered this morning from hospitalist.  Results were placed in the chart at 2345 on 11/24 instead of this morning.  Nurse placed call to lab to verify date and time, lab stated that they will notify nurse if they needed redrawn.  Nurse placed call to hospitalist regarding critical values and explain lab times.  Some labs were an expected level.  Waiting on response.

## 2014-11-04 NOTE — Progress Notes (Signed)
Patient's daughter not present for meeting, phone call made to daughter Valerie Thompson regarding appointment, daughter stated, "we are on our way."

## 2014-11-04 NOTE — Progress Notes (Signed)
[facility]  Nephrology Consult Note  [phone]  [phone]   [email or web address]    Patient:  Valerie Thompson   DOB: 25-Feb-1961    CC:  Hypoglycemia    HPI: The patient is a 53 y.o. female with significant past medical history of metastastic lung Ca , HTN, paroxysmal A. Fib, factor 5 Leiden mutation, DM who was admitted for hypoglycemia one day prior. She has recently moved her from [place] to be close to family. K was noted to be 6.1 on admission. Scr is 1.2. She is not on RAAS blockade or K supplements at home. She is on metformin for DM.     Subjective:  -pt seen and examined  -PMSHX and meds reviewed  -No family at bedside      Events noted  -transferred to ICU for A.fib with RVR, hypotension  -family meeting with palliative care scheduled for today  -s/p pamidronate x 1 dose  -lethargic, difficult to arouse      ROS:   Limited due to patient factors        Meds:  Current Facility-Administered Medications   Medication Dose Route Frequency Provider Last Rate Last Dose   ??? acetaminophen (TYLENOL) tablet 650 mg  650 mg Oral Q4H PRN [name], MD       ??? acetaminophen (TYLENOL) suppository 650 mg  650 mg Rectal Q4H PRN [name], MD       ??? HYDROmorphone (DILAUDID) injection 1 mg  1 mg Intravenous Q2H PRN [name], MD       ??? LORazepam (ATIVAN) injection 1 mg  1 mg Intravenous Q4H PRN [name], MD       ??? promethazine (PHENERGAN) suppository 25 mg  25 mg Rectal Q4H PRN [name], MD       ??? magnesium sulfate 1 g in dextrose 5% 100 mL IVPB (premix)  1 g Intravenous PRN Dimas Chyle, MD       ??? morphine (PF) injection 2 mg  2 mg Intravenous Q2H PRN Dimas Chyle, MD   2 mg at 11/04/14 0600   ??? lidocaine 1 % (PF) injection            ??? LORazepam (ATIVAN) 2 MG/ML injection            ??? LORazepam (ATIVAN) injection 2 mg  2 mg Intramuscular Once Leonides Cave, MD       ??? 0.9 % sodium chloride bolus  500 mL Intravenous Once Leonides Cave, MD       ???  metoprolol (LOPRESSOR) injection 5 mg  5 mg Intravenous Q6H PRN Leonides Cave, MD       ??? doxycycline (VIBRAMYCIN) 100 mg in dextrose 5 % 100 mL IVPB  100 mg Intravenous Q12H Leonides Cave, MD 100 mL/hr at 11/03/14 2200 100 mg at 11/03/14 2200   ??? 0.9 % sodium chloride infusion   Intravenous Once Leonides Cave, MD       ??? polyvinyl alcohol (LIQUIFILM TEARS) 1.4 % ophthalmic solution 1 drop  1 drop Right Eye Q4H PRN Leonides Cave, MD       ??? insulin lispro (HUMALOG) injection pen 0-6 Units  0-6 Units Subcutaneous Q4H Leonides Cave, MD   2 Units at 11/04/14 0449   ??? sodium chloride 0.9 % infusion            ??? dextrose 50 % solution 25 g  25 g Intravenous PRN Neville Route, MD       ???  glucose (GLUTOSE) 40 % oral gel 15 g  15 g Oral PRN Neville Route, MD       ??? dextrose 50 % solution 12.5 g  12.5 g Intravenous PRN Chalana Alleen Borne, MD       ??? glucagon (rDNA) injection 1 mg  1 mg Intramuscular PRN Neville Route, MD       ??? dextrose 5 % solution  100 mL/hr Intravenous PRN Neville Route, MD       ??? 0.9 % sodium chloride infusion   Intravenous Continuous Leonides Cave, MD 100 mL/hr at 11/03/14 2320     ??? amiodarone (CORDARONE) tablet 200 mg  200 mg Oral Daily Westley Hummer, MD   200 mg at 11/03/14 1020   ??? docusate sodium (COLACE) capsule 100 mg  100 mg Oral BID Westley Hummer, MD   100 mg at 11/02/14 1027   ??? QUEtiapine (SEROQUEL) tablet 50 mg  50 mg Oral BID Westley Hummer, MD   50 mg at 11/03/14 1019   ??? polyethylene glycol (GLYCOLAX) packet 17 g  17 g Oral Daily PRN Westley Hummer, MD       ??? pantoprazole (PROTONIX) tablet 40 mg  40 mg Oral QAM AC Christian Moreen Fowler, MD   40 mg at 11/03/14 4742   ??? sodium chloride flush 0.9 % injection 10 mL  10 mL Intravenous 2 times per day Westley Hummer, MD   10 mL at 11/01/14 2005   ??? sodium chloride flush 0.9 % injection 10 mL  10 mL Intravenous PRN Westley Hummer, MD        ??? magnesium hydroxide (MILK OF MAGNESIA) 400 MG/5ML suspension 30 mL  30 mL Oral Daily PRN Westley Hummer, MD       ??? ondansetron (ZOFRAN) injection 4 mg  4 mg Intravenous Q6H PRN Westley Hummer, MD       ??? dextrose 50 % solution 25 g  25 g Intravenous PRN Tama Gander, MD   25 g at 11/01/14 5956   ??? enoxaparin (LOVENOX) injection 30 mg  30 mg Subcutaneous Daily Neville Route, MD   30 mg at 11/03/14 1020   ??? piperacillin-tazobactam (ZOSYN) IVPB extended infusion 3.375 g  3.375 g Intravenous Q8H Chalana U Gunawardena, MD 12.5 mL/hr at 11/03/14 2300 3.375 g at 11/03/14 2300       Vitals:  BP 97/56 mmHg   Pulse 101   Temp(Src) 99.4 ??F (37.4 ??C) (Temporal)   Resp 24   Ht 5\' 4"  (1.626 m)   Wt 299 lb (135.626 kg)   BMI 51.30 kg/m2   SpO2 97%    Physical Exam:  Gen: morbidly obese, difficult to arouse, lethargic   HEENT: anciteric sclera, right ey proptosis  CV: RRR no m/r/g. No S3.  Lungs: scattered rhonchi, no resp distress  Abd: S/NT +BS, obese  Ext: ++edema, no cyanosis  Skin: Warm. No rashes appreciated.  GU: foley in place  Neuro: asleep, difficult to arouse, non-focal  Joints: No erythema noted over joints.  Labs:  CBC with Differential:    Lab Results   Component Value Date    WBC 47.0 11/03/2014    RBC 3.42 11/03/2014    HGB 7.9 11/03/2014    HCT 25.8 11/03/2014    PLT 306 11/03/2014    MCV 75.3 11/03/2014    MCH 23.2 11/03/2014    MCHC 30.8 11/03/2014    RDW 14.9 11/03/2014  SEGSPCT 61.0 06/22/2009    BANDSPCT 16 11/03/2014    METASPCT 9 11/03/2014    LYMPHOPCT 4.0 11/03/2014    MONOPCT 8.0 11/03/2014    MYELOPCT 2 11/03/2014    EOSPCT 2.0 06/22/2009    BASOPCT 0.0 11/03/2014    MONOSABS 3.1 11/03/2014    LYMPHSABS 1.6 11/03/2014    EOSABS 0.0 11/03/2014    BASOSABS 0.0 11/03/2014    DIFFTYPE Manual-K 06/22/2009     BMP:    Lab Results   Component Value Date    NA 140 11/03/2014    K 4.6 11/03/2014    CL 101 11/03/2014    CO2 29 11/03/2014    BUN 32 11/03/2014    LABALBU 2.6  11/03/2014    CREATININE 1.3 11/03/2014    CALCIUM 13.4 11/03/2014    GFRAA 52 11/03/2014    GFRAA >60 06/22/2009    LABGLOM 43 11/03/2014    GLUCOSE 192 11/03/2014       Assessment/Plan:    IMPRESSION/RECOMMENDATIONS:   Hyperkalemia: UA with e/o rhadbo-large blood with only 3-5 RBC/? 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-she is on prednisone-she is on prednisone  -pt not on supplements/RAAS blockade  -s/p lasix and kayexalate  -K is stable at 4.6    Not a dialysis candidate given stage IV Ca-hospice is appropriate    A.fib with RVR:  -transferred to ICU  -given digoxin, meotprolol      Hypercalcemia:  -2/2 underlying squamous cell lung Ca-contributing to AMS  -continue IVF, will not dose with lasix today given hypotension  -stopped HCTZ  -PTH is suppressed, vitamin D is low-will not replete  -given pamidronate 30mg  11/02/2014    AKI:  -pre-renal/CIN  -Scr stable  -continue supportive care    Leukcocytosis: worsening  -blood cultures done  -on zosyn/doxy  -per primary    Anemia:  -transfuse prn    Hypotension:  -sepsis vs adrenal insufficiency vs pericardial effusion  -continue IVF      Metastatic lung Ca:  -per oncology  -recent chemo per records-s/p carboplatin and gemcitabine  -no further chemo per Oncology      Alba Cory, MD  11/04/2014

## 2014-11-04 NOTE — Progress Notes (Signed)
Patient appears to be resting and is comfortable nurse repositioned patient.  Nurse will continue to monitor.

## 2014-11-04 NOTE — Consults (Signed)
Case management following pending palliative care consult.  Family meeting planned for 1600 tomorrow with daughter and sons, palliative care, Dr Santiago Glad. Oncology recommends hospice care.

## 2014-11-04 NOTE — Progress Notes (Signed)
Patient is noted to be sitting up in bed and pulling at lines. Tele was removed, Oxygen tubing was removed, and patient was pulling at foley.  Since oxygen was removed patient was extremely restless and would not allow nurse to reapply oxygen.  Medications had to be given for patient comfort and for the need for oxygen to be reapplied.  Patient at this time did state that she was in pain and medications for pain is to be administered.

## 2014-11-04 NOTE — Progress Notes (Signed)
[facility] PROGRESS NOTE    11/04/2014 5:25 PM        Name: Valerie Thompson .              Admitted: 11/01/2014  Primary Care Provider: Darrick Huntsman, MD (Tel: [phone])      Subjective:  Patient seen and examined, she is not responding to me, she does move arms.  Nurse states that she denied pain earlier.  Patient has been receiving pain medicine and anxiety medicine.    Reviewed interval ancillary notes    Current Medications    acetaminophen (TYLENOL) tablet 650 mg Q4H PRN   acetaminophen (TYLENOL) suppository 650 mg Q4H PRN   HYDROmorphone (DILAUDID) injection 1 mg Q2H PRN   promethazine (PHENERGAN) suppository 25 mg Q4H PRN   LORazepam (ATIVAN) injection 1 mg Q2H PRN   0.9 % sodium chloride infusion Continuous   vancomycin (VANCOCIN) 1000 mg in dextrose 5% 200 mL IVPB Q12H   phenylephrine (NEO-SYNEPHRINE) 50 mg in dextrose 5 % 250 mL infusion Continuous   magnesium sulfate 1 g in dextrose 5% 100 mL IVPB (premix) PRN   morphine (PF) injection 2 mg Q2H PRN   LORazepam (ATIVAN) injection 2 mg Once   0.9 % sodium chloride bolus Once   metoprolol (LOPRESSOR) injection 5 mg Q6H PRN   0.9 % sodium chloride infusion Once   polyvinyl alcohol (LIQUIFILM TEARS) 1.4 % ophthalmic solution 1 drop Q4H PRN   insulin lispro (HUMALOG) injection pen 0-6 Units Q4H   dextrose 50 % solution 25 g PRN   glucose (GLUTOSE) 40 % oral gel 15 g PRN   dextrose 50 % solution 12.5 g PRN   glucagon (rDNA) injection 1 mg PRN   dextrose 5 % solution PRN   0.9 % sodium chloride infusion Continuous   amiodarone (CORDARONE) tablet 200 mg Daily   docusate sodium (COLACE) capsule 100 mg BID   QUEtiapine (SEROQUEL) tablet 50 mg BID   polyethylene glycol (GLYCOLAX) packet 17 g Daily PRN   pantoprazole (PROTONIX) tablet 40 mg QAM AC   sodium chloride flush 0.9 % injection 10 mL 2 times per day   sodium chloride flush 0.9 % injection 10 mL PRN   magnesium  hydroxide (MILK OF MAGNESIA) 400 MG/5ML suspension 30 mL Daily PRN   ondansetron (ZOFRAN) injection 4 mg Q6H PRN   dextrose 50 % solution 25 g PRN   enoxaparin (LOVENOX) injection 30 mg Daily   piperacillin-tazobactam (ZOSYN) IVPB extended infusion 3.375 g Q8H       Objective:  BP 82/56 mmHg   Pulse 99   Temp(Src) 97.4 ??F (36.3 ??C) (Temporal)   Resp 16   Ht 5\' 4"  (1.626 m)   Wt 299 lb (135.626 kg)   BMI 51.30 kg/m2   SpO2 94%    Intake/Output Summary (Last 24 hours) at 11/04/14 1725  Last data filed at 11/04/14 1207   Gross per 24 hour   Intake   1410 ml   Output   1810 ml   Net   -400 ml    Wt Readings from Last 3 Encounters:   11/01/14 299 lb (135.626 kg)       General appearance: Appears uncomfortable  Eyes: Patient is unable to open her right eye, Her right eye is proptotic   ENT: Moist oral mucosa. Trachea midline  Cardiovascular: tachycardic, Regular rhythm, normal S1, S2. No murmur. No edema in lower extremities  Respiratory:   Decreased effort, no adventitious sounds, decreased  in the bases.  GI: Abdomen soft, no tenderness, not distended,  Hypoactive bowel sounds  Musculoskeletal: No cyanosis in digits, neck supple  Neurology: patient's cranial nerves are grossly intact, she does not speak to me, orientation is not possible to assess, patient does move both arms spontaneously  Psych: unable to assess  Skin: Skin is loose, there are multiple nodularities    Labs and Tests:  CBC:   Recent Labs      11/02/14   0949  11/03/14   0503  11/03/14   2345   WBC  40.1*  39.3*  47.0*   HGB  9.3*  8.4*  7.9*   PLT  246  287  306     BMP:  Recent Labs      11/03/14   0503  11/03/14   2345  11/04/14   0435   NA  138  140  141   K  4.7  4.6  4.7   CL  99  101  101   CO2  28  29  29    BUN  32*  32*  33*   CREATININE  1.4*  1.3*  1.2*   GLUCOSE  147*  192*  194*     Hepatic: No results for input(s): AST, ALT, ALB, BILITOT, ALKPHOS in the last 72 hours.      Problem List  Active Problems:    Hypoglycemia    Atrial  fibrillation with RVR (HCC)    Metastatic lung cancer (metastasis from lung to other site) (HCC)    Hyperkalemia    COPD, severity to be determined [facility])       Assessment & Plan:     Hypotension: Patient experiencing increasing hypotension as the day progresses  -IV fluids  -Start Neo-Synephrine if needed  -Continue broad-spectrum antibiotics  -Use digoxin for treatment of atrial fibrillation    Atrial fibrillation with rapid ventricular response: Patient with a history of SVT while previously critically ill at outside facility.  -Amiodarone  -Digoxin    Acute on chronic respiratory failure: Etiology is unclear. 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 Stage IV. Poor prognosis.  -Oncology is following  -Patient appears to have a poor performance status and I doubt that she is a candidate for additional chemotherapy or radiation at this point  -Palliative care is following, patient most likely better candidate for hospice care    Hyperkalemia: Etiology unclear however patient is improved with medical treatment  -Nephrology following, appreciate assistance    Leukocytosis: Etiology unclear, patient with significant bandemia, she is improving  -IV vancomycin  -Continue Zosyn  -Discontinue doxycycline    Anemia: Likely due to marrow involvement  -No plan for transfusion    Acute renal failure: Patient's creatinine 1.4    Hypercalcemia, calcium 12.8  -Check ionized calcium  -Continue Lasix      Diet: DIET GENERAL; Low Potassium  Code:Full Code        [name], MD   11/04/2014 5:25 PM    NOTE: This note was created using Dragon voice recognition software.Every effort was made to ensure accuracy; however, inadvertent computerized transcription errors may  be present.

## 2014-11-04 NOTE — Progress Notes (Signed)
Clinical Pharmacy Note:    Pharmacy consulted to dose Vancomycin for leukocytosis.    Age: 53  Height: 5'4"   Weight: 135.6 kg  Scr= 1.2 mg/dL      Started Vancomycin 1250 IV q8. Trough ordered before 4th dose (11/27 @0630 ) with an order to hold if trough greater than 46mcg/ml.    Thanks for the consult!  Kara Dies, PharmD

## 2014-11-04 NOTE — Plan of Care (Signed)
Problem: Falls - Risk of  Goal: Absence of falls  Outcome: Ongoing  Patient has remained free of falls this shift.  Patient has made attempts to get out of bed on own but is not able to complete task without assistance.  Bed alarm is in place for safety.  Bed remains locked and lowered in the lowest position.  Continue POC.

## 2014-11-04 NOTE — Other (Addendum)
Introduced self to patient. Initial shift assessment completed, see complex assessment in doc flowsheet. Questions answered from family. Call light within reach. Bed in low position with wheels locked. Patient unable to take PM meds due to drowsy, when awaken patient starts pulling everything off (oxygen, monitor and trying to get OOB.) VSS. Family at bedside and updated on patient POC.

## 2014-11-04 NOTE — Other (Signed)
Patient trying to get OOB and pulling at devices - Ativan given for restlessness

## 2014-11-04 NOTE — Progress Notes (Signed)
Pt's daughter returned call, states she will be here tomorrow to meet with Jenny Reichmann, updated her with pt status, will monitor.

## 2014-11-04 NOTE — Progress Notes (Signed)
Palliative Care:     Met with daughter Valerie Thompson and three sons. Discussed oncology recommendation for comfort care and very poor prognosis. Sons and daughter are extremely tearful as patient's illness has been brief and she has been rapidly declining. Discussed comfort care and hospice as an options. Oldest daughter and son state they do not want patient to suffer and other two sons support this decision. No extraordinary measures, allow a natural peaceful death. DNR CC. Comfort is goal of care. Will consult Hospice for tomorrow. Support given, prayer offered. [facility] consult called and faxed.

## 2014-11-04 NOTE — Progress Notes (Signed)
Patient's daughter is at bedside.  Nurse spoke with daughter regarding patients condition.  Patient does respond "huh" when her name is called but does not respond every time when addressed..  Patient has had episodes or restlessness this shift.  Patient attempted to comfort and calm patient but was unsuccessful.  Patient had turned on side was putting feet out of bed and pullings at tele lines, IV and oxygen tubing. Ativan was given as ordered to help calm patient and was effective. Patient did answer "no" when nurse asked patient if she was in pain.  Patients daughter stated that patient gets very hot and that could be why she pulls at her gown and tele wires.  Nurse got patient a fan to provide comfort and it has seemed to help with restlessness.  Nurse will continue to monitor.

## 2014-11-04 NOTE — Consults (Signed)
[facility]   Consult Note      Reason for Consult: Respiratory Failure, COPD, Small Cell Lung Ca   Requesting Physician: Dr Effie Berkshire    Subjective:   CHIEF COMPLAINT / HPI:                The patient is a 53 y.o. female with significant past medical history of:      Diagnosis Date   ??? Acute respiratory failure with hypercapnia ([place])    ??? Diabetes mellitus ([place])    ??? Hypertension    ??? Leukocytosis    ??? Nausea and vomiting    ??? PAF (paroxysmal atrial fibrillation) ([facility])    ??? Pedal edema    ??? SVT (supraventricular tachycardia)    ??? UTI (urinary tract infection)    ??? VT (ventricular tachycardia) ([place])    ??? DVT (deep vein thrombosis) in pregnancy    ??? Factor 5 Leiden mutation, heterozygous ([place])    ??? Lung cancer ([place])    ??? Metastatic disease ([place])      rt eye; rt shoulder; brain        The patient presents to ICU after RR on the floor related to hypoxmeia and rapid afib. She has improved. She is not in distress currently. She is known to have metastatic Small Cell CA.Marland Kitchen She recnetly moved here from [place].      Past Surgical History:        Procedure Laterality Date   ??? Lung biopsy     ??? Cholecystectomy     ??? Other surgical history       uterine ablation     Current Medications:    Current facility-administered medications:acetaminophen (TYLENOL) tablet 650 mg, 650 mg, Oral, Q4H PRN  acetaminophen (TYLENOL) suppository 650 mg, 650 mg, Rectal, Q4H PRN  HYDROmorphone (DILAUDID) injection 1 mg, 1 mg, Intravenous, Q2H PRN  LORazepam (ATIVAN) injection 1 mg, 1 mg, Intravenous, Q4H PRN  promethazine (PHENERGAN) suppository 25 mg, 25 mg, Rectal, Q4H PRN  magnesium sulfate 1 g in dextrose 5% 100 mL IVPB (premix), 1 g, Intravenous, PRN  morphine (PF) injection 2 mg, 2 mg, Intravenous, Q2H PRN  LORazepam (ATIVAN) injection 2 mg, 2 mg, Intramuscular, Once  0.9 % sodium chloride bolus, 500 mL, Intravenous, Once  metoprolol (LOPRESSOR) injection 5 mg, 5 mg, Intravenous, Q6H PRN  doxycycline (VIBRAMYCIN) 100  mg in dextrose 5 % 100 mL IVPB, 100 mg, Intravenous, Q12H  0.9 % sodium chloride infusion, , Intravenous, Once  polyvinyl alcohol (LIQUIFILM TEARS) 1.4 % ophthalmic solution 1 drop, 1 drop, Right Eye, Q4H PRN  insulin lispro (HUMALOG) injection pen 0-6 Units, 0-6 Units, Subcutaneous, Q4H  dextrose 50 % solution 25 g, 25 g, Intravenous, PRN  glucose (GLUTOSE) 40 % oral gel 15 g, 15 g, Oral, PRN  dextrose 50 % solution 12.5 g, 12.5 g, Intravenous, PRN  glucagon (rDNA) injection 1 mg, 1 mg, Intramuscular, PRN  dextrose 5 % solution, 100 mL/hr, Intravenous, PRN  0.9 % sodium chloride infusion, , Intravenous, Continuous  amiodarone (CORDARONE) tablet 200 mg, 200 mg, Oral, Daily  docusate sodium (COLACE) capsule 100 mg, 100 mg, Oral, BID  QUEtiapine (SEROQUEL) tablet 50 mg, 50 mg, Oral, BID  polyethylene glycol (GLYCOLAX) packet 17 g, 17 g, Oral, Daily PRN  pantoprazole (PROTONIX) tablet 40 mg, 40 mg, Oral, QAM AC  sodium chloride flush 0.9 % injection 10 mL, 10 mL, Intravenous, 2 times per day  sodium chloride flush 0.9 %  injection 10 mL, 10 mL, Intravenous, PRN  magnesium hydroxide (MILK OF MAGNESIA) 400 MG/5ML suspension 30 mL, 30 mL, Oral, Daily PRN  ondansetron (ZOFRAN) injection 4 mg, 4 mg, Intravenous, Q6H PRN  dextrose 50 % solution 25 g, 25 g, Intravenous, PRN  enoxaparin (LOVENOX) injection 30 mg, 30 mg, Subcutaneous, Daily  piperacillin-tazobactam (ZOSYN) IVPB extended infusion 3.375 g, 3.375 g, Intravenous, Q8H    Allergies   Allergen Reactions   ??? Latex Hives   ??? Aspirin      Must be enteric coated   ??? Ibuprofen      SOB, hives   ??? Iodine Hives   ??? Orange Juice [Orange Oil] Hives       Social History:    TOBACCO:   reports that she quit smoking about 4 months ago. She does not have any smokeless tobacco history on file.  ETOH:   reports that she drinks alcohol.  Patient currently lives independently  Environmental/chemical exposure: none known     Family History:       Problem Relation Age of Onset   ??? Heart  Disease Father      REVIEW OF SYSTEMS:    CONSTITUTIONAL:  negative for fevers, chills, diaphoresis, activity change, appetite change, fatigue, night sweats and unexpected weight change.   EYES:  negative for blurred vision, eye discharge, visual disturbance and icterus  HEENT:  negative for hearing loss, tinnitus, ear drainage, sinus pressure, nasal congestion, epistaxis and snoring  RESPIRATORY:  See HPI  CARDIOVASCULAR:  negative for chest pain, palpitations, exertional chest pressure/discomfort, edema, syncope  GASTROINTESTINAL:  negative for nausea, vomiting, diarrhea, constipation, blood in stool and abdominal pain  GENITOURINARY:  negative for frequency, dysuria, urinary incontinence, decreased urine volume, and hematuria  HEMATOLOGIC/LYMPHATIC:  negative for easy bruising, bleeding and lymphadenopathy  ALLERGIC/IMMUNOLOGIC:  negative for recurrent infections, angioedema, anaphylaxis and drug reactions  ENDOCRINE:  negative for weight changes and diabetic symptoms including polyuria, polydipsia and polyphagia  MUSCULOSKELETAL:  negative for  pain, joint swelling, decreased range of motion and muscle weakness  NEUROLOGICAL:  negative for headaches, slurred speech, unilateral weakness  PSYCHIATRIC/BEHAVIORAL: negative for hallucinations, behavioral problems, confusion and agitation.     Objective:   PHYSICAL EXAM:      VITALS:  BP 112/54 mmHg   Pulse 93   Temp(Src) 97.4 ??F (36.3 ??C) (Temporal)   Resp 25   Ht 5\' 4"  (1.626 m)   Wt 299 lb (135.626 kg)   BMI 51.30 kg/m2   SpO2 97%     24HR INTAKE/OUTPUT:    Intake/Output Summary (Last 24 hours) at 11/04/14 1104  Last data filed at 11/04/14 3500   Gross per 24 hour   Intake   1410 ml   Output   3130 ml   Net  -1720 ml     CONSTITUTIONAL:  awake, alert, cooperative, no apparent distress, and appears stated age  NECK:  Supple, symmetrical, trachea midline, no adenopathy, thyroid symmetric, not enlarged and no tenderness, skin normal  LUNGS:  no increased work of  breathing and clear to auscultation. No accessory muscle use  CARDIOVASCULAR: S1 and S2, no edema and no JVD  ABDOMEN:  normal bowel sounds, non-distended and no masses palpated, and no tenderness to palpation. No hepatospleenomegaly  LYMPHADENOPATHY:  no axillary or supraclavicular adenopathy. No cervical adnenopathy  PSYCHIATRIC: Oriented to person place and time. No obvious depression or anxiety.  MUSCULOSKELETAL: No obvious misalignment or effusion of the joints. No clubbing, cyanosis of the digits.  SKIN:  normal skin color, texture, turgor and no redness, warmth, or swelling. No palpable nodules    DATA:    Old records have been reviewed    CBC:  Recent Labs      11/02/14   0949  11/03/14   0503  11/03/14   2345   WBC  40.1*  39.3*  47.0*   RBC  4.01  3.65*  3.42*   HGB  9.3*  8.4*  7.9*   HCT  29.6*  26.9*  25.8*   PLT  246  287  306   MCV  73.7*  73.7*  75.3*   MCH  23.2*  23.0*  23.2*   MCHC  31.5  31.2  30.8*   RDW  14.6  14.7  14.9   BANDSPCT   --   16*  10*      BMP:  Recent Labs      11/03/14   0503  11/03/14   2345  11/04/14   0435   NA  138  140  141   K  4.7  4.6  4.7   CL  99  101  101   CO2  28  29  29    BUN  32*  32*  33*   CREATININE  1.4*  1.3*  1.2*   CALCIUM  12.8*  13.4*  13.6*   GLUCOSE  147*  192*  194*      ABG:  Recent Labs      11/02/14   1709   PHART  7.448   PCO2ART  40.5   PO2ART  71.1*   HCO3ART  27.4   O2SATART  94.9   BEART  3.1*       Lab Results   Component Value Date    BNP 850* 10/31/2014     Lab Results   Component Value Date    CKTOTAL 42 11/02/2014    TROPONINI <0.01 11/04/2014       Cultures:     Abx:    Radiology Review:  All pertinent images / reports were reviewed as a part of this visit.     Assessment:     1. Acute on [place] respiratory failure  2. Metaststic small cell CA  3. Afib with rapid VR  4. Renal failure  5. Hyperkalemia    Plan:     1. CT chest shows mediastinal and subcarinal adenopathy. There is consolidation vs mass in the RUL and RLL along with  PN in  the LLL.  2. She has low grad etemp and a leukocytosis  3. She is on Zosyn and Doxy.  4. Sats are good on NC O2  5. She is not ordered HHN. Will give her at least prn Duoneb.  6. Plan to meet with palliative care later today.

## 2014-11-04 NOTE — Progress Notes (Signed)
Patient was moved to bed 9 to have access to the [facility] lift.  Jamella patients daughter is aware of the move.

## 2014-11-04 NOTE — Other (Addendum)
Patient Acct Nbr:  [id number]  Primary AUTH/CERT:    Primary Insurance Company Name:   SELF PAY PLANS  Primary Insurance Plan Name:  SELF PAY UNDER [id number]  Primary Insurance Group Number:    Primary Insurance Plan Type: [employer] Insurance Policy Number:  [id number]

## 2014-11-04 NOTE — Progress Notes (Signed)
Nurse spoke with Dr. Santiago Glad regarding patients request change of code status per palliative care.  Nurse notified MD of the [facility] order and inpatient consult to Hospice.      Last set of orders to start neo, fluids, obtain lab work and dose for vanco have been discontinued.

## 2014-11-05 LAB — POCT GLUCOSE
POC Glucose: 221 mg/dl — ABNORMAL HIGH (ref 70–99)
POC Glucose: 273 mg/dl — ABNORMAL HIGH (ref 70–99)

## 2014-11-05 MED ORDER — HALOPERIDOL LACTATE 5 MG/ML IJ SOLN
5 MG/ML | INTRAMUSCULAR | Status: DC | PRN
Start: 2014-11-05 — End: 2014-11-06
  Administered 2014-11-05 – 2014-11-06 (×2): 5 mg via INTRAVENOUS

## 2014-11-05 MED FILL — NORMAL SALINE FLUSH 0.9 % IV SOLN: 0.9 % | INTRAVENOUS | Qty: 30

## 2014-11-05 MED FILL — NORMAL SALINE FLUSH 0.9 % IV SOLN: 0.9 % | INTRAVENOUS | Qty: 80

## 2014-11-05 MED FILL — HYDROMORPHONE HCL 1 MG/ML IJ SOLN: 1 MG/ML | INTRAMUSCULAR | Qty: 1

## 2014-11-05 MED FILL — NORMAL SALINE FLUSH 0.9 % IV SOLN: 0.9 % | INTRAVENOUS | Qty: 10

## 2014-11-05 MED FILL — LORAZEPAM 2 MG/ML IJ SOLN: 2 MG/ML | INTRAMUSCULAR | Qty: 1

## 2014-11-05 MED FILL — NORMAL SALINE FLUSH 0.9 % IV SOLN: 0.9 % | INTRAVENOUS | Qty: 20

## 2014-11-05 MED FILL — HALOPERIDOL LACTATE 5 MG/ML IJ SOLN: 5 MG/ML | INTRAMUSCULAR | Qty: 1

## 2014-11-05 MED FILL — NORMAL SALINE FLUSH 0.9 % IV SOLN: 0.9 % | INTRAVENOUS | Qty: 60

## 2014-11-05 NOTE — Progress Notes (Signed)
Nurse repositioned patient and bed pad underneath patient is clean and dry.  Replacement of larger catheter was effective in keeping patient dry.  Nurse will continue to monitor.

## 2014-11-05 NOTE — Progress Notes (Addendum)
[facility]    Follow Up Note    Subjective:   CHIEF COMPLAINT / HPI: Respiratory failure, COPD, small cell lung CA    The patient presents to ICU after RR on the floor related to hypoxmeia and rapid afib. She has improved. She is not in distress currently. She is known to have metastatic Small Cell CA.Marland Kitchen She recnetly moved here from [place].      The patient is unresponsive to me this am. Code status changed overnight. Hospice consult planned for today. Tolerating 6L NC but has frequently bouts of agitation when she pulls at lines / tubing.     Past Medical History:    Reviewed; no changes    Social History:    Reviewed; no changes    REVIEW OF SYSTEMS:    Unobtainable.     Objective:   PHYSICAL EXAM:        VITALS:  BP 96/72 mmHg   Pulse 110   Temp(Src) 97.8 ??F (36.6 ??C) (Temporal)   Resp 22   Ht 5\' 4"  (1.626 m)   Wt 299 lb (135.626 kg)   BMI 51.30 kg/m2   SpO2 95% on 6L NC    24HR INTAKE/OUTPUT:    Intake/Output Summary (Last 24 hours) at 11/05/14 5284  Last data filed at 11/04/14 2136   Gross per 24 hour   Intake      0 ml   Output   1030 ml   Net  -1030 ml       CONSTITUTIONAL:  awake, alert,  no apparent distress, and appears stated age  LUNGS:  No increased work of breathing and clear to auscultation, no crackles or wheezes  CARDIOVASCULAR: S1 and S2 and no JVD  ABDOMEN:  normal bowel sounds, non-distended and non-tender to palpation  EXT: No edema, no calf tenderness. Pulses are present bilaterally.  NEUROLOGIC:  Mental Status Exam:  Level of Alertness:   awake  Orientation:   person, place, time. Non focal  SKIN:  normal skin color, texture, turgor, no redness, warmth, or swelling at IV sites    DATA:    CBC:  Recent Labs      11/02/14   0949  11/03/14   0503  11/03/14   2345   WBC  40.1*  39.3*  47.0*   RBC  4.01  3.65*  3.42*   HGB  9.3*  8.4*  7.9*   HCT  29.6*  26.9*  25.8*   PLT  246  287  306   MCV  73.7*  73.7*  75.3*   MCH  23.2*  23.0*  23.2*   MCHC  31.5  31.2  30.8*    RDW  14.6  14.7  14.9   BANDSPCT   --   16*  10*      BMP:  Recent Labs      11/03/14   0503  11/03/14   2345  11/04/14   0435   NA  138  140  141   K  4.7  4.6  4.7   CL  99  101  101   CO2  28  29  29    BUN  32*  32*  33*   CREATININE  1.4*  1.3*  1.2*   CALCIUM  12.8*  13.4*  13.6*   GLUCOSE  147*  192*  194*      ABG:  Recent Labs      11/02/14  1709   PHART  7.448   PCO2ART  40.5   PO2ART  71.1*   HCO3ART  27.4   O2SATART  94.9   BEART  3.1*     Cultures:  NGTD    Abx:  Zosyn D4 Vanco D2    Radiology Review:  Pertinent images / reports were reviewed as a part of this visit.     Assessment:   1. Acute on [place] respiratory failure  2. Metaststic small cell CA  3. Afib with rapid VR  4. Renal failure  5. Hyperkalemia    Plan:     1. CT chest shows mediastinal / subcarinal adenopathy. There is consolidation vs mass in the RULE and RLL along w/ RN in the LLL. Patient is poorly responsive. Changed to DNR CC yesterday.   2. No temps today but still w/ impressive leukocytosis.   3. She is on Zosyn.  4. Sats are good. Wean 02 for saturations >90%.  5. Plan for hospice consult and comfort care measures only. This seems appropriate.   6. As she remains on abx, will follow peripherally until POC firmly established.     A.Rumpke RN MSN ACNP CCRN    Attending addendum:    I personally have seen and examined the patient. I reviewed and discussed the above note with NP  Review of systems were reviewed.  PMHx, PSHx and Medication list were reviewed.    I agree with the above diagnosis and plan of care along of the following additions:  - will sign off, call with questions

## 2014-11-05 NOTE — Progress Notes (Signed)
On repositioning of patient bed pad is wet, foley catheter in place.  Nurse deflated balloon and advanced catheter and inflated balloon.  Tubing is secured in stat lock device.  Foley care was provided along with a complete bed change.  Nurse will continue to monitor.

## 2014-11-05 NOTE — Other (Signed)
11/05/14 2037   CPOT (Critical Patient)   CPOT (Critial Patient) Vent Status Extubated   CPOT (Critical Patient) Facial Expression 1   CPOT (Critical Patient) Body Movement 2   CPOT (Critical Patient) Muscle Tension 1   CPOT (Critical Patient) Vocalization (extubated) 1   Score CPOT (Extubated) 5     Pt displaying signs of discomfort and starting to pull at gown and TLC.  Pt given prn dilaudid and will monitor for additional ativan or haldol need, see MAR.

## 2014-11-05 NOTE — Progress Notes (Signed)
[facility]  Nephrology Consult Note  [phone]  [phone]   [email or web address]    Patient:  Valerie Thompson   DOB: 1961-01-30    CC:  Hypoglycemia    HPI: The patient is a 53 y.o. female with significant past medical history of metastastic lung Ca , HTN, paroxysmal A. Fib, factor 5 Leiden mutation, DM who was admitted for hypoglycemia one day prior. She has recently moved her from [place] to be close to family. K was noted to be 6.1 on admission. Scr is 1.2. She is not on RAAS blockade or K supplements at home. She is on metformin for DM.     Subjective:  -pt seen and examined  -PMSHX and meds reviewed  -No family at bedside      Events noted  Code status changed  Plan for inpatient hospice        ROS:   Limited due to patient factors        Meds:  Current Facility-Administered Medications   Medication Dose Route Frequency Provider Last Rate Last Dose   ??? acetaminophen (TYLENOL) tablet 650 mg  650 mg Oral Q4H PRN [name], MD       ??? HYDROmorphone (DILAUDID) injection 1 mg  1 mg Intravenous Q2H PRN [name], MD   1 mg at 11/05/14 8299   ??? promethazine (PHENERGAN) suppository 25 mg  25 mg Rectal Q4H PRN [name], MD       ??? LORazepam (ATIVAN) injection 1 mg  1 mg Intravenous Q2H PRN [name], MD   1 mg at 11/05/14 0725   ??? magnesium sulfate 1 g in dextrose 5% 100 mL IVPB (premix)  1 g Intravenous PRN Dimas Chyle, MD       ??? morphine (PF) injection 2 mg  2 mg Intravenous Q2H PRN Dimas Chyle, MD   2 mg at 11/04/14 0600   ??? LORazepam (ATIVAN) injection 2 mg  2 mg Intramuscular Once Leonides Cave, MD       ??? metoprolol (LOPRESSOR) injection 5 mg  5 mg Intravenous Q6H PRN Leonides Cave, MD       ??? polyvinyl alcohol (LIQUIFILM TEARS) 1.4 % ophthalmic solution 1 drop  1 drop Right Eye Q4H PRN Leonides Cave, MD       ??? insulin lispro (HUMALOG) injection pen 0-6 Units  0-6 Units Subcutaneous Q4H Leonides Cave, MD   2 Units at 11/04/14 2133   ??? dextrose 50 %  solution 25 g  25 g Intravenous PRN Neville Route, MD       ??? glucose (GLUTOSE) 40 % oral gel 15 g  15 g Oral PRN Neville Route, MD       ??? dextrose 50 % solution 12.5 g  12.5 g Intravenous PRN Neville Route, MD       ??? glucagon (rDNA) injection 1 mg  1 mg Intramuscular PRN Neville Route, MD       ??? dextrose 5 % solution  100 mL/hr Intravenous PRN Neville Route, MD       ??? 0.9 % sodium chloride infusion   Intravenous Continuous Leonides Cave, MD 100 mL/hr at 11/03/14 2320     ??? amiodarone (CORDARONE) tablet 200 mg  200 mg Oral Daily Westley Hummer, MD   200 mg at 11/03/14 1020   ??? docusate sodium (COLACE) capsule 100 mg  100 mg Oral BID Westley Hummer, MD  100 mg at 11/02/14 1027   ??? QUEtiapine (SEROQUEL) tablet 50 mg  50 mg Oral BID Westley Hummer, MD   50 mg at 11/03/14 1019   ??? polyethylene glycol (GLYCOLAX) packet 17 g  17 g Oral Daily PRN Westley Hummer, MD       ??? pantoprazole (PROTONIX) tablet 40 mg  40 mg Oral QAM AC Christian Moreen Fowler, MD   40 mg at 11/03/14 7829   ??? sodium chloride flush 0.9 % injection 10 mL  10 mL Intravenous 2 times per day Westley Hummer, MD   10 mL at 11/04/14 2138   ??? sodium chloride flush 0.9 % injection 10 mL  10 mL Intravenous PRN Westley Hummer, MD       ??? magnesium hydroxide (MILK OF MAGNESIA) 400 MG/5ML suspension 30 mL  30 mL Oral Daily PRN Westley Hummer, MD       ??? ondansetron (ZOFRAN) injection 4 mg  4 mg Intravenous Q6H PRN Westley Hummer, MD       ??? dextrose 50 % solution 25 g  25 g Intravenous PRN Tama Gander, MD   25 g at 11/01/14 5621   ??? enoxaparin (LOVENOX) injection 30 mg  30 mg Subcutaneous Daily Neville Route, MD   30 mg at 11/04/14 3086   ??? piperacillin-tazobactam (ZOSYN) IVPB extended infusion 3.375 g  3.375 g Intravenous Q8H Chalana U Gunawardena, MD 12.5 mL/hr at 11/04/14 1718 3.375 g at 11/04/14 1718        Vitals:  BP 96/72 mmHg   Pulse 110   Temp(Src) 97.8 ??F (36.6 ??C) (Temporal)   Resp 22   Ht 5\' 4"  (1.626 m)   Wt 299 lb (135.626 kg)   BMI 51.30 kg/m2   SpO2 95%    Physical Exam:    Limited exam:  Gen: morbidly obese, difficult to arouse, restless  HEENT: right eye proptosis-unchanged  CV: RRR no m/r/g. No S3.  Lungs:no resp distress  Abd: obese  Ext: ++edema, no cyanosis  Skin:. No rashes appreciated.  Neuro: moving BUE  Joints: No erythema noted over joints.  Labs:  CBC with Differential:    Lab Results   Component Value Date    WBC 47.0 11/03/2014    RBC 3.42 11/03/2014    HGB 7.9 11/03/2014    HCT 25.8 11/03/2014    PLT 306 11/03/2014    MCV 75.3 11/03/2014    MCH 23.2 11/03/2014    MCHC 30.8 11/03/2014    RDW 14.9 11/03/2014    SEGSPCT 61.0 06/22/2009    BANDSPCT 10 11/03/2014    METASPCT 1 11/03/2014    LYMPHOPCT 6.0 11/03/2014    MONOPCT 10.0 11/03/2014    MYELOPCT 1 11/03/2014    EOSPCT 2.0 06/22/2009    BASOPCT 0.0 11/03/2014    MONOSABS 4.7 11/03/2014    LYMPHSABS 2.8 11/03/2014    EOSABS 0.0 11/03/2014    BASOSABS 0.0 11/03/2014    DIFFTYPE Manual-K 06/22/2009     BMP:    Lab Results   Component Value Date    NA 141 11/04/2014    K 4.7 11/04/2014    CL 101 11/04/2014    CO2 29 11/04/2014    BUN 33 11/04/2014    LABALBU 2.6 11/04/2014    CREATININE 1.2 11/04/2014    CALCIUM 13.6 11/04/2014    GFRAA 57 11/04/2014    GFRAA >60 06/22/2009    LABGLOM 47 11/04/2014    GLUCOSE  194 11/04/2014       Assessment/Plan:    IMPRESSION/RECOMMENDATIONS:   Hyperkalemia:  Resolved    Plan for hospice noted  Will sign off, please call with questions      AKI: not a candidate for dialysis    Leukcocytosis: worsening  -blood cultures done  -on zosyn/doxy  -per primary    Anemia:  -transfuse prn    Metastatic lung Ca:  -per oncology  -recent chemo per records-s/p carboplatin and gemcitabine  -no further chemo per Oncology      Alba Cory, MD  11/05/2014

## 2014-11-05 NOTE — Other (Signed)
Family at bedside with patient restless and pulling everything off and sliding down in bed. - Diluadid IV given for comfort

## 2014-11-05 NOTE — Progress Notes (Signed)
Patient noted to be very restless, oxygen tubing removed and restlessness increases. Nurse unable to replace oxygen without giving medication.  Ativan given for restlessness.  Nurse removed tele per hospitalist.

## 2014-11-05 NOTE — Progress Notes (Signed)
Pt seen and assessment completed see flow sheets for full assessment.  Pt in bed an appears comfortable c eyes closed, breathing labored but O2 sats within normal ranges.  No current needs, will monitor.    RONALD J. Deval Mroczka, RN

## 2014-11-05 NOTE — Progress Notes (Addendum)
Patient's foley remains in place.  Leaking is still present, foley care completed, brief was placed for patient comfort.  Nurse will continue to monitor.

## 2014-11-05 NOTE — Progress Notes (Signed)
[facility] PROGRESS NOTE    11/05/2014 11:45 AM        Name: Valerie Thompson .              Admitted: 11/01/2014  Primary Care Provider: Darrick Huntsman, MD (Tel: [phone])      Subjective:   Patient seen and examined, discussed with RN, discuss with hospice RN, discussed with nephrology, discussed with cardiology.    Reviewed interval ancillary notes    Current Medications    acetaminophen (TYLENOL) tablet 650 mg Q4H PRN   HYDROmorphone (DILAUDID) injection 1 mg Q2H PRN   promethazine (PHENERGAN) suppository 25 mg Q4H PRN   LORazepam (ATIVAN) injection 1 mg Q2H PRN   magnesium sulfate 1 g in dextrose 5% 100 mL IVPB (premix) PRN   morphine (PF) injection 2 mg Q2H PRN   LORazepam (ATIVAN) injection 2 mg Once   metoprolol (LOPRESSOR) injection 5 mg Q6H PRN   polyvinyl alcohol (LIQUIFILM TEARS) 1.4 % ophthalmic solution 1 drop Q4H PRN   insulin lispro (HUMALOG) injection pen 0-6 Units Q4H   dextrose 50 % solution 25 g PRN   glucose (GLUTOSE) 40 % oral gel 15 g PRN   dextrose 50 % solution 12.5 g PRN   glucagon (rDNA) injection 1 mg PRN   dextrose 5 % solution PRN   0.9 % sodium chloride infusion Continuous   amiodarone (CORDARONE) tablet 200 mg Daily   docusate sodium (COLACE) capsule 100 mg BID   QUEtiapine (SEROQUEL) tablet 50 mg BID   polyethylene glycol (GLYCOLAX) packet 17 g Daily PRN   pantoprazole (PROTONIX) tablet 40 mg QAM AC   sodium chloride flush 0.9 % injection 10 mL 2 times per day   sodium chloride flush 0.9 % injection 10 mL PRN   magnesium hydroxide (MILK OF MAGNESIA) 400 MG/5ML suspension 30 mL Daily PRN   ondansetron (ZOFRAN) injection 4 mg Q6H PRN   dextrose 50 % solution 25 g PRN   enoxaparin (LOVENOX) injection 30 mg Daily   piperacillin-tazobactam (ZOSYN) IVPB extended infusion 3.375 g Q8H       Objective:  BP 96/72 mmHg   Pulse 110   Temp(Src) 97.8 ??F (36.6 ??C) (Temporal)   Resp 22   Ht 5' 4" (1.626 m)    Wt 299 lb (135.626 kg)   BMI 51.30 kg/m2   SpO2 95%    Intake/Output Summary (Last 24 hours) at 11/05/14 1145  Last data filed at 11/05/14 1100   Gross per 24 hour   Intake     10 ml   Output   1330 ml   Net  -1320 ml    Wt Readings from Last 3 Encounters:   11/01/14 299 lb (135.626 kg)       General appearance:  Appears uncomfortable  Eyes:   Right eye is still protruding and closed, left eye is reactive  ENT: Moist oral mucosa. Trachea midline, no adenopathy.  Cardiovascular: Regular rhythm, normal S1, S2. No murmur.  To plus edema in lower extremities  Respiratory: Not using accessory muscles. Good inspiratory effort. Clear to auscultation bilaterally, no wheeze or crackles.   GI: Abdomen soft, no tenderness, not distended, normal bowel sounds  Musculoskeletal: No cyanosis in digits, neck supple  Neurology: CN 2-12 grossly intact. Patient moves arms and legs spontaneously  Psych: a little agitated, constantly on dressing herself, appears oriented to self only  Skin:  Skin is loose, suggesting recent weight loss, multiple nodules palpable throughout the skin  Labs and Tests:  CBC:   Recent Labs      11/03/14   0503  11/03/14   2345   WBC  39.3*  47.0*   HGB  8.4*  7.9*   PLT  287  306     BMP:  Recent Labs      11/03/14   0503  11/03/14   2345  11/04/14   0435   NA  138  140  141   K  4.7  4.6  4.7   CL  99  101  101   CO2  _0 BUN  32*  32*  33*   CREATININE  1.4*  1.3*  1.2*   GLUCOSE  147*  192*  194*     Hepatic: No results for input(s): AST, ALT, ALB, BILITOT, ALKPHOS in the last 72 hours.      Problem List  Active Problems:    Hypoglycemia    Atrial fibrillation with RVR (HCC)    Metastatic lung cancer (metastasis from lung to other site) (HCC)    Hyperkalemia    COPD, severity to be determined ([place])    Acute respiratory failure with hypoxia (HCC)       Assessment & Plan:     Hypotension: patient is experiencing hypotension, possibly due to sepsis, dehydration, advancing disease and heart  failure.  -Comfort measures  -No pressors  -No IV fluid    Atrial fibrillation with rapid ventricular response: Patient with a history of SVT while previously critically ill at outside facility.  -Amiodarone has been continued  -Discontinue telemetry monitoring    Acute on chronic respiratory failure: Etiology is unclear. 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 Stage IV. Poor prognosis.  - family has met with palliative care, they have agreed to focus on comfort, they are meeting with hospice for enrollment tomorrow at 10 AM.  -CODE STATUS is DO NOT RESUSCITATE comfort care    Hyperkalemia: Etiology unclear however patient is improved with medical treatment    Leukocytosis: Etiology unclear, patient with significant bandemia, she is improving  - continue Zosyn alone    Anemia: Likely due to marrow involvement  -No plan for transfusion    Acute renal failure: Patient's creatinine 1.4, no further interventions    Hypercalcemia, no further interventions      Diet: DIET GENERAL; Low Potassium  Code:DNR-CC        [name], MD   11/05/2014 11:45 AM    NOTE: This note was created using Dragon voice recognition software.Every effort was made to ensure accuracy; however, inadvertent computerized transcription errors may be present.

## 2014-11-05 NOTE — Progress Notes (Signed)
Patient is noted to be restless, trying is get OOB and pulling off oxygen.  Ativan was just given for restlessness but patient requires more for comfort.  Once able to assess oxygenation sats were 76% on RA.  Oxygen was able to be reapplied and oxygen level had to be increased to 8L HFNC for sats to be >92%.  Nurse added sterile water for comfort and to prevent dryness from increased oxygen levels and oral care was performed.

## 2014-11-05 NOTE — Progress Notes (Addendum)
Patient foley continues to leak and nurse has had to frequently change linens due to wetness.  Briefs were tried to help but patient still remained wet.  Due to skin being exposed to wetness, skin appears fragile and macerated.  Interdry placed in perineum folds to help wick moisture off of skin.  Powder applied for moisture control.  In between upper thigh area appears pink.  And needs to monitored closely for wetness and risk for breakdown.  Nurse will continue to monitor.

## 2014-11-05 NOTE — Progress Notes (Signed)
11/05/14 2248   CPOT (Critical Patient)   CPOT (Critial Patient) Vent Status Extubated   CPOT (Critical Patient) Facial Expression 1   CPOT (Critical Patient) Body Movement 2   CPOT (Critical Patient) Muscle Tension 1   CPOT (Critical Patient) Vocalization (extubated) 1   Score CPOT (Extubated) 5     Pt restless after turning, pulling at gown and TLC, throwing legs out of bed.  Prn ativan given to help keep pt comfortable.

## 2014-11-06 LAB — POCT GLUCOSE
POC Glucose: 273 mg/dl — ABNORMAL HIGH (ref 70–99)
POC Glucose: 282 mg/dl — ABNORMAL HIGH (ref 70–99)

## 2014-11-06 LAB — AFB CULTURE WITH SMEAR (NOT AT ARMC): Acid Fast Smear: NONE SEEN

## 2014-11-06 MED ORDER — LORAZEPAM 2 MG/ML IJ SOLN
2 MG/ML | INTRAMUSCULAR | Status: DC | PRN
Start: 2014-11-06 — End: 2014-11-06
  Administered 2014-11-06: 17:00:00 1 mg via INTRAVENOUS

## 2014-11-06 MED ORDER — HYDROMORPHONE HCL 1 MG/ML IJ SOLN
1 MG/ML | INTRAMUSCULAR | Status: DC | PRN
Start: 2014-11-06 — End: 2014-11-06
  Administered 2014-11-06: 17:00:00 1 mg via INTRAVENOUS

## 2014-11-06 MED FILL — HYDROMORPHONE HCL 1 MG/ML IJ SOLN: 1 MG/ML | INTRAMUSCULAR | Qty: 1

## 2014-11-06 MED FILL — LORAZEPAM 2 MG/ML IJ SOLN: 2 MG/ML | INTRAMUSCULAR | Qty: 1

## 2014-11-06 MED FILL — HALOPERIDOL LACTATE 5 MG/ML IJ SOLN: 5 MG/ML | INTRAMUSCULAR | Qty: 1

## 2014-11-06 MED FILL — NORMAL SALINE FLUSH 0.9 % IV SOLN: 0.9 % | INTRAVENOUS | Qty: 10

## 2014-11-06 MED FILL — ZOSYN 3-0.375 GM/50ML IV SOLN: INTRAVENOUS | Qty: 50

## 2014-11-06 NOTE — Plan of Care (Signed)
Problem: Falls - Risk of  Goal: Absence of falls  Outcome: Met This Shift  Appropriate fall precautions in place, see flow sheets.  Pt remains free from falls.

## 2014-11-06 NOTE — Progress Notes (Signed)
D: Hospice RN at bedside to review plan of care with family.

## 2014-11-06 NOTE — Progress Notes (Signed)
[facility] PROGRESS NOTE    11/06/2014 8:06 AM        Name: Lexine Jaspers .              Admitted: 11/01/2014  Primary Care Provider: Darrick Huntsman, MD (Tel: [phone])      Subjective:  Patient seen and examined, discussed with RN overnight, no acute concerns.  Patient is nonverbal at this time    Reviewed interval ancillary notes    Current Medications    haloperidol lactate (HALDOL) injection 5 mg Q4H PRN   acetaminophen (TYLENOL) tablet 650 mg Q4H PRN   HYDROmorphone (DILAUDID) injection 1 mg Q2H PRN   promethazine (PHENERGAN) suppository 25 mg Q4H PRN   LORazepam (ATIVAN) injection 1 mg Q2H PRN   magnesium sulfate 1 g in dextrose 5% 100 mL IVPB (premix) PRN   morphine (PF) injection 2 mg Q2H PRN   LORazepam (ATIVAN) injection 2 mg Once   metoprolol (LOPRESSOR) injection 5 mg Q6H PRN   polyvinyl alcohol (LIQUIFILM TEARS) 1.4 % ophthalmic solution 1 drop Q4H PRN   insulin lispro (HUMALOG) injection pen 0-6 Units Q4H   dextrose 50 % solution 25 g PRN   glucose (GLUTOSE) 40 % oral gel 15 g PRN   dextrose 50 % solution 12.5 g PRN   glucagon (rDNA) injection 1 mg PRN   dextrose 5 % solution PRN   0.9 % sodium chloride infusion Continuous   amiodarone (CORDARONE) tablet 200 mg Daily   docusate sodium (COLACE) capsule 100 mg BID   QUEtiapine (SEROQUEL) tablet 50 mg BID   polyethylene glycol (GLYCOLAX) packet 17 g Daily PRN   pantoprazole (PROTONIX) tablet 40 mg QAM AC   sodium chloride flush 0.9 % injection 10 mL 2 times per day   sodium chloride flush 0.9 % injection 10 mL PRN   magnesium hydroxide (MILK OF MAGNESIA) 400 MG/5ML suspension 30 mL Daily PRN   ondansetron (ZOFRAN) injection 4 mg Q6H PRN   dextrose 50 % solution 25 g PRN   enoxaparin (LOVENOX) injection 30 mg Daily   piperacillin-tazobactam (ZOSYN) IVPB extended infusion 3.375 g Q8H       Objective:  BP 89/33 mmHg   Pulse 102   Temp(Src) 97.3 ??F (36.3 ??C)  (Axillary)   Resp 25   Ht _0  (1.626 m)   Wt 299 lb (135.626 kg)   BMI 51.30 kg/m2   SpO2 96%    Intake/Output Summary (Last 24 hours) at 11/06/14 0806  Last data filed at 11/06/14 0600   Gross per 24 hour   Intake     10 ml   Output   1110 ml   Net  -1100 ml    Wt Readings from Last 3 Encounters:   11/01/14 299 lb (135.626 kg)       General appearance: Appears uncomfortable  Eyes: Right eye is still protruding and closed, left eye is reactive  ENT: Moist oral mucosa. Trachea midline, no adenopathy.  Cardiovascular: Regular rhythm, normal S1, S2. No murmur. 2+ edema in lower extremities  Respiratory: decreased effort, tachypnea, no adventitious sounds  GI: Abdomen soft, no tenderness, not distended, normal bowel sounds  Musculoskeletal: No cyanosis in digits, neck supple  Neurology: CN 2-12 grossly intact. Patient moves arms and legs spontaneously  Psych: a little agitated, undressing herself  Skin: Skin is loose, suggesting recent weight loss, multiple nodules palpable throughout the skin      Labs and Tests:  CBC:   Recent Labs  11/03/14   2345   WBC  47.0*   HGB  7.9*   PLT  306     BMP:  Recent Labs      11/03/14   2345  11/04/14   0435   NA  140  141   K  4.6  4.7   CL  101  101   CO2  29  29   BUN  32*  33*   CREATININE  1.3*  1.2*   GLUCOSE  192*  194*     Hepatic: No results for input(s): AST, ALT, ALB, BILITOT, ALKPHOS in the last 72 hours.      Problem List  Active Problems:    Hypoglycemia    Atrial fibrillation with RVR (HCC)    Metastatic lung cancer (metastasis from lung to other site) (HCC)    Hyperkalemia    COPD, severity to be determined ([place])    Acute respiratory failure with hypoxia (HCC)       Assessment & Plan:     Hypotension: patient is experiencing hypotension, possibly due to sepsis, dehydration, advancing disease and heart failure.  -Comfort measures  -No pressors  -No IV fluid    Atrial fibrillation with rapid ventricular response: Patient with a history of SVT while previously  critically ill at outside facility.  -Amiodarone has been continued  -Discontinue telemetry monitoring    Acute on chronic respiratory failure: Etiology is unclear. 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 Stage IV. Poor prognosis.  - family has met with palliative care, they have agreed to focus on comfort, they are meeting with hospice for enrollment at 10 AM.  -CODE STATUS is DO NOT RESUSCITATE comfort care    Hyperkalemia: Etiology unclear however patient is improved with medical treatment    Leukocytosis: Etiology unclear, patient with significant bandemia, she is improving  - continue Zosyn alone    Anemia: Likely due to marrow involvement  -No plan for transfusion    Acute renal failure: Patient's creatinine 1.4, no further interventions    Hypercalcemia, no further interventions      Diet: DIET GENERAL; Low Potassium  Code:DNR-CC        [name], MD   11/06/2014 8:06 AM    NOTE: This note was created using Dragon voice recognition software.Every effort was made to ensure accuracy; however, inadvertent computerized transcription errors may be present.

## 2014-11-06 NOTE — Progress Notes (Signed)
D: Pt transferred to [facility] via bed by transport team, pt's belongings collected and transported with patient. Family at bedside.

## 2014-11-06 NOTE — Progress Notes (Signed)
D: Pt continues to show signs of restlessness, PRN ativan, haldol and dilaudid administered as ordered.

## 2014-11-06 NOTE — Discharge Summary (Signed)
[facility] DISCHARGE SUMMARY    Patient Demographics    Patient: Valerie Thompson  Date of Birth:  21-Aug-1961  MRN:  [id number]   Code: [facility]    Primary care provider. NABILA Katherine Roan, MD  (Tel: [phone])    Admit date: 11/01/2014    Discharge date (blank if same as Note Date):   Note Date: 11/06/2014     Reason for Hospitalization.   Chief Complaint   Patient presents with   ??? Shortness of Breath     pt brought in via squad for SOB, pt's family called for low blood sugar--squad gave glucose tab and rechecked FSBS 74.  pt stage 4 lung cancer.  pt drove up 7 hrs from [employer] on Tuesday.         Significant Findings.   Active Problems:    Hypoglycemia    Atrial fibrillation with RVR (HCC)    Metastatic lung cancer (metastasis from lung to other site) (HCC)    Hyperkalemia    COPD, severity to be determined ([place])    Acute respiratory failure with hypoxia (HCC)       Problems and results from this hospitalization that need follow up.  1. None      Significant test results and incidental findings.  1. CT scan of the chest showing extensive metastatic disease    Invasive procedures and treatments.   1.  central line placement    [facility] Course.    Patient was brought to the hospital because she wasn't able to prepare herself, she also had change in her mentation, she was unable to eat.  Patient had a new diagnosis of small cell lung cancer with extensive metastatic disease, she had failed chemotherapy and was not a candidate for any additional chemotherapy due to performance status.  Patient's mentation did not improve, a family meeting was held, and elected for DO NOT RESUSCITATE orders.  Patient was enrolled in hospice and transferred to the hospice inpatient facility.    Consults.  IP CONSULT TO CASE MANAGEMENT  IP CONSULT TO SPIRITUAL SERVICES  IP CONSULT TO DIETITIAN  IP CONSULT TO ONCOLOGY  IP CONSULT TO  NEPHROLOGY  IP CONSULT TO PALLIATIVE CARE  IP CONSULT TO CARDIOLOGY  IP CONSULT TO PULMONOLOGY  IP CONSULT TO PHARMACY  IP CONSULT TO HOSPICE    Physical examination on discharge day.   BP 94/55 mmHg   Pulse 102   Temp(Src) 98.1 ??F (36.7 ??C) (Axillary)   Resp 22   Ht 5\' 4"  (1.626 m)   Wt 299 lb (135.626 kg)   BMI 51.30 kg/m2   SpO2 100%  General appearance: Appears uncomfortable  Eyes: Right eye is still protruding and closed, left eye is reactive  ENT: Moist oral mucosa. Trachea midline, no adenopathy.  Cardiovascular: Regular rhythm, normal S1, S2. No murmur. 2+ edema in lower extremities  Respiratory: decreased effort, tachypnea, no adventitious sounds  GI: Abdomen soft, no tenderness, not distended, normal bowel sounds  Musculoskeletal: No cyanosis in digits, neck supple  Neurology: CN 2-12 grossly intact. Patient moves arms and legs spontaneously  Psych: a little agitated, undressing herself  Skin: Skin is loose, suggesting recent weight loss, multiple nodules palpable throughout the skin    Medication instructions provided to patient at discharge.     Medication List      STOP taking these medications          acetaminophen 325 MG tablet   Commonly known as:  TYLENOL  amiodarone 200 MG tablet   Commonly known as:  CORDARONE       diazepam 5 MG tablet   Commonly known as:  VALIUM       docusate sodium 100 MG capsule   Commonly known as:  COLACE       doxycycline 100 MG tablet   Commonly known as:  VIBRA-TABS       gabapentin 100 MG capsule   Commonly known as:  NEURONTIN       glipiZIDE 5 MG tablet   Commonly known as:  GLUCOTROL       hydrochlorothiazide 25 MG tablet   Commonly known as:  HYDRODIURIL       indomethacin 50 MG capsule   Commonly known as:  INDOCIN       insulin glargine 100 UNIT/ML injection vial   Commonly known as:  LANTUS       LORazepam 0.5 MG tablet   Commonly known as:  ATIVAN       metFORMIN 500 MG tablet   Commonly known as:  GLUCOPHAGE       oxyCODONE 15 MG immediate release tablet    Commonly known as:  OXY-IR       pantoprazole sodium 40 MG Pack packet   Commonly known as:  PROTONIX       pioglitazone 30 MG tablet   Commonly known as:  ACTOS       polyethylene glycol powder   Commonly known as:  GLYCOLAX       QUEtiapine 50 MG tablet   Commonly known as:  SEROQUEL       therapeutic multivitamin-minerals tablet             Discharge recommendations given to patient.  Follow Up.   Hospice later today  Disposition.  Hospice inpatient unit  Activity. As tolerated  Diet: DIET GENERAL; Low Potassium      Code status:  DNR-CC     Spent more than 30 minutes in discharge process.    Signed:  [name], MD     11/06/2014 11:27 AM    NOTE: This note was created using Dragon voice recognition software.Every effort was made to ensure accuracy; however, inadvertent computerized transcription errors may be present.

## 2014-11-06 NOTE — Progress Notes (Signed)
D: AVS printed and provided to [facility].

## 2014-11-06 NOTE — Progress Notes (Signed)
D: Transport at bedside.

## 2014-11-10 DEATH — deceased

## 2015-02-08 IMAGING — CR DG CHEST 1V PORT
1 series · 1 of 1 positions shown · non-contrast
Comparison: 08/12/2014

CLINICAL DATA: Postop bronchoscopy.

EXAM:
PORTABLE CHEST - 1 VIEW

[AP]
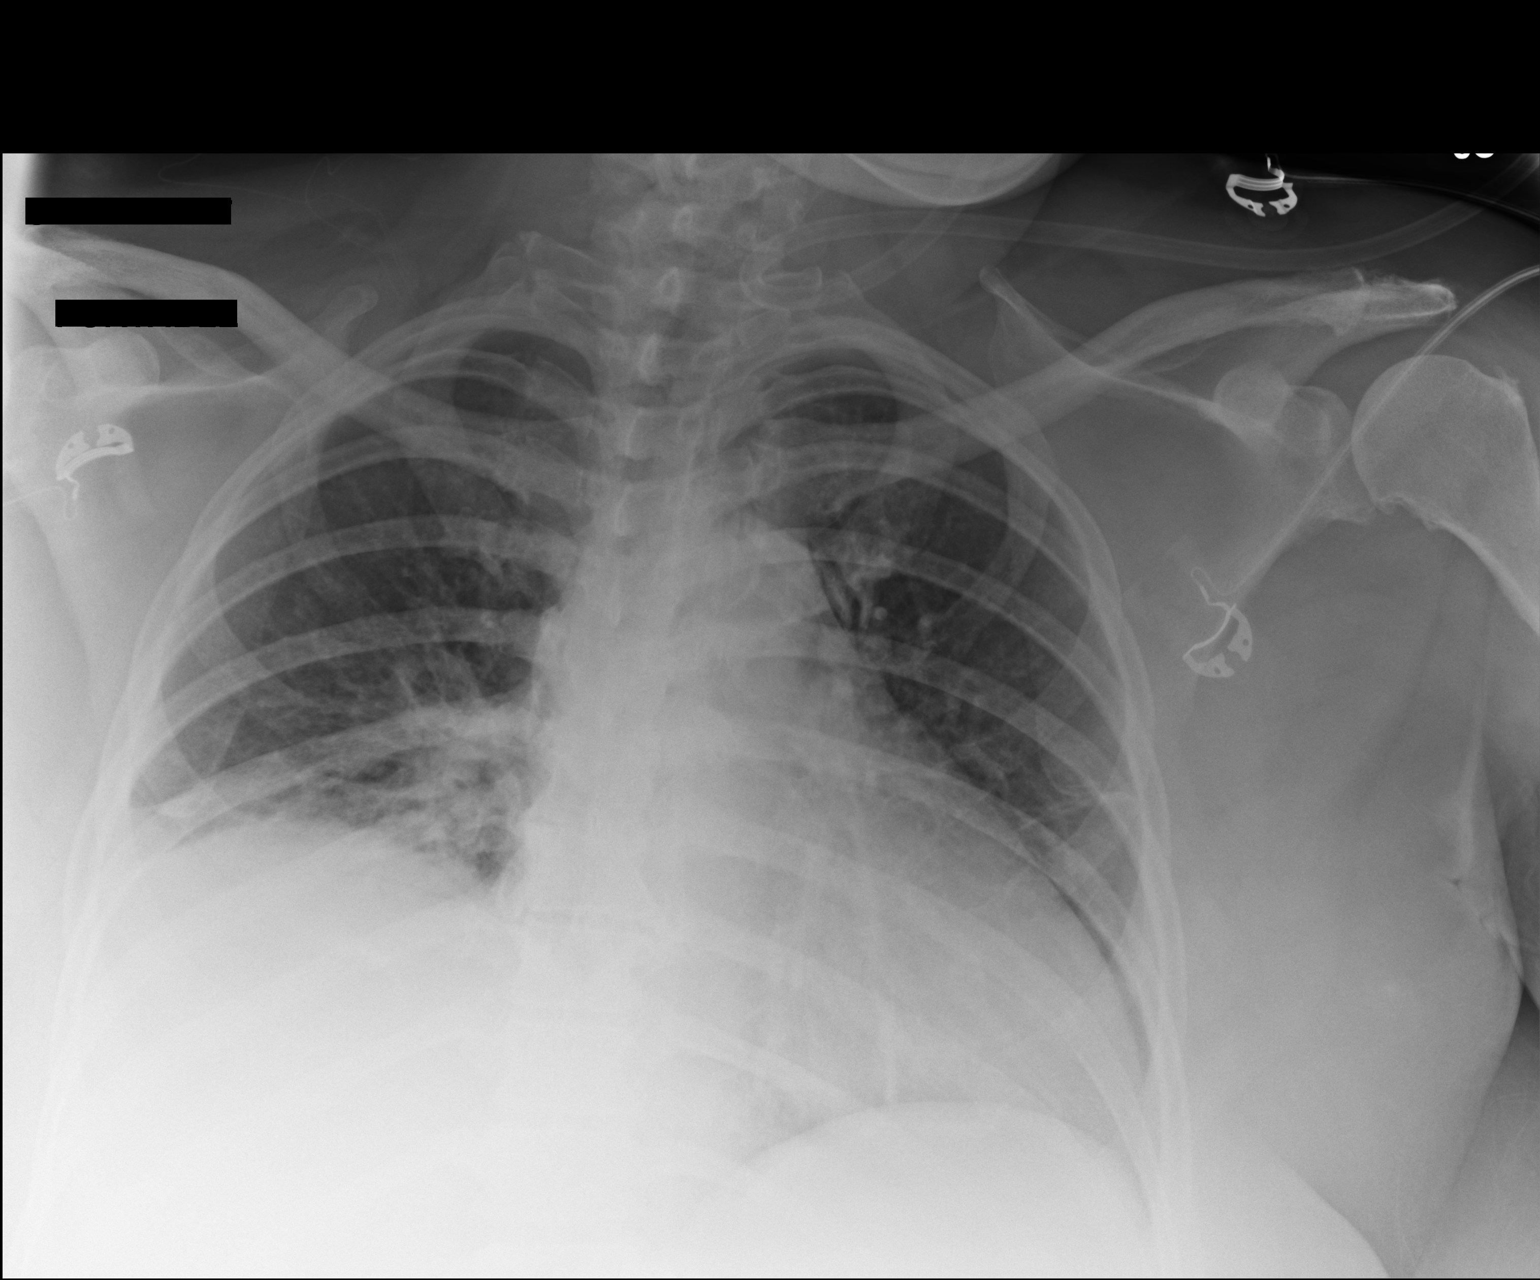

[1 of 1 positions shown; findings below may reference images not displayed]

FINDINGS: Low lung volumes. Right lower lobe airspace opacity could be related
to bronchoscopy or represent atelectasis. No pneumothorax.
Previously seen mid right lung nodule not well visualized on today's
study, likely due to the low volumes and airspace disease. Minimal
left base atelectasis. Heart is borderline in size.
IMPRESSION: Low lung volumes with bibasilar opacities, likely atelectasis, right
greater than left. No pneumothorax.

## 2015-03-10 IMAGING — CR DG CHEST 2V
2 series · 2 of 2 positions shown · non-contrast
Comparison: 10/21/2014

CLINICAL DATA: Leukocytosis.  Chest pain.

EXAM:
CHEST  2 VIEW

[w chest lat]
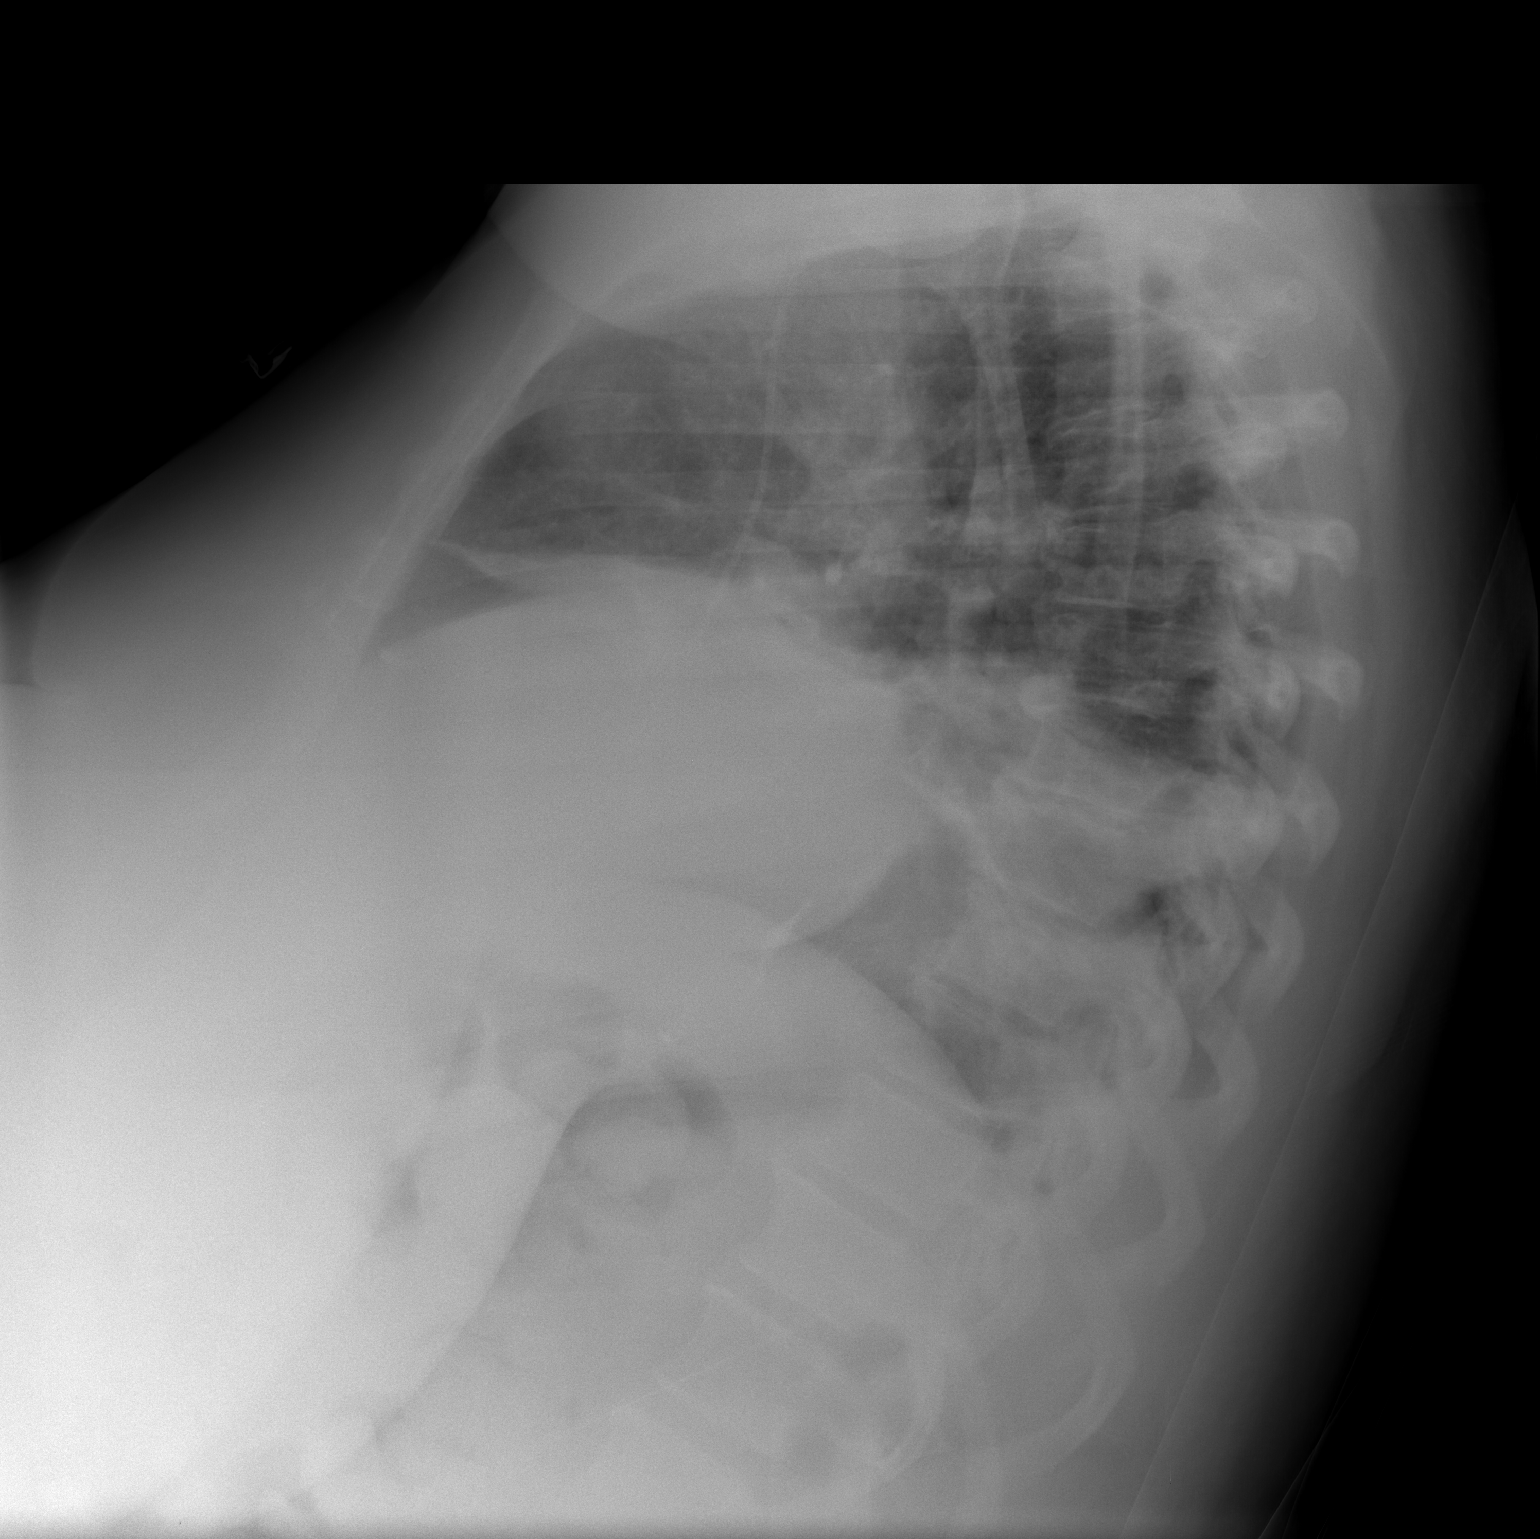

[view not recorded]
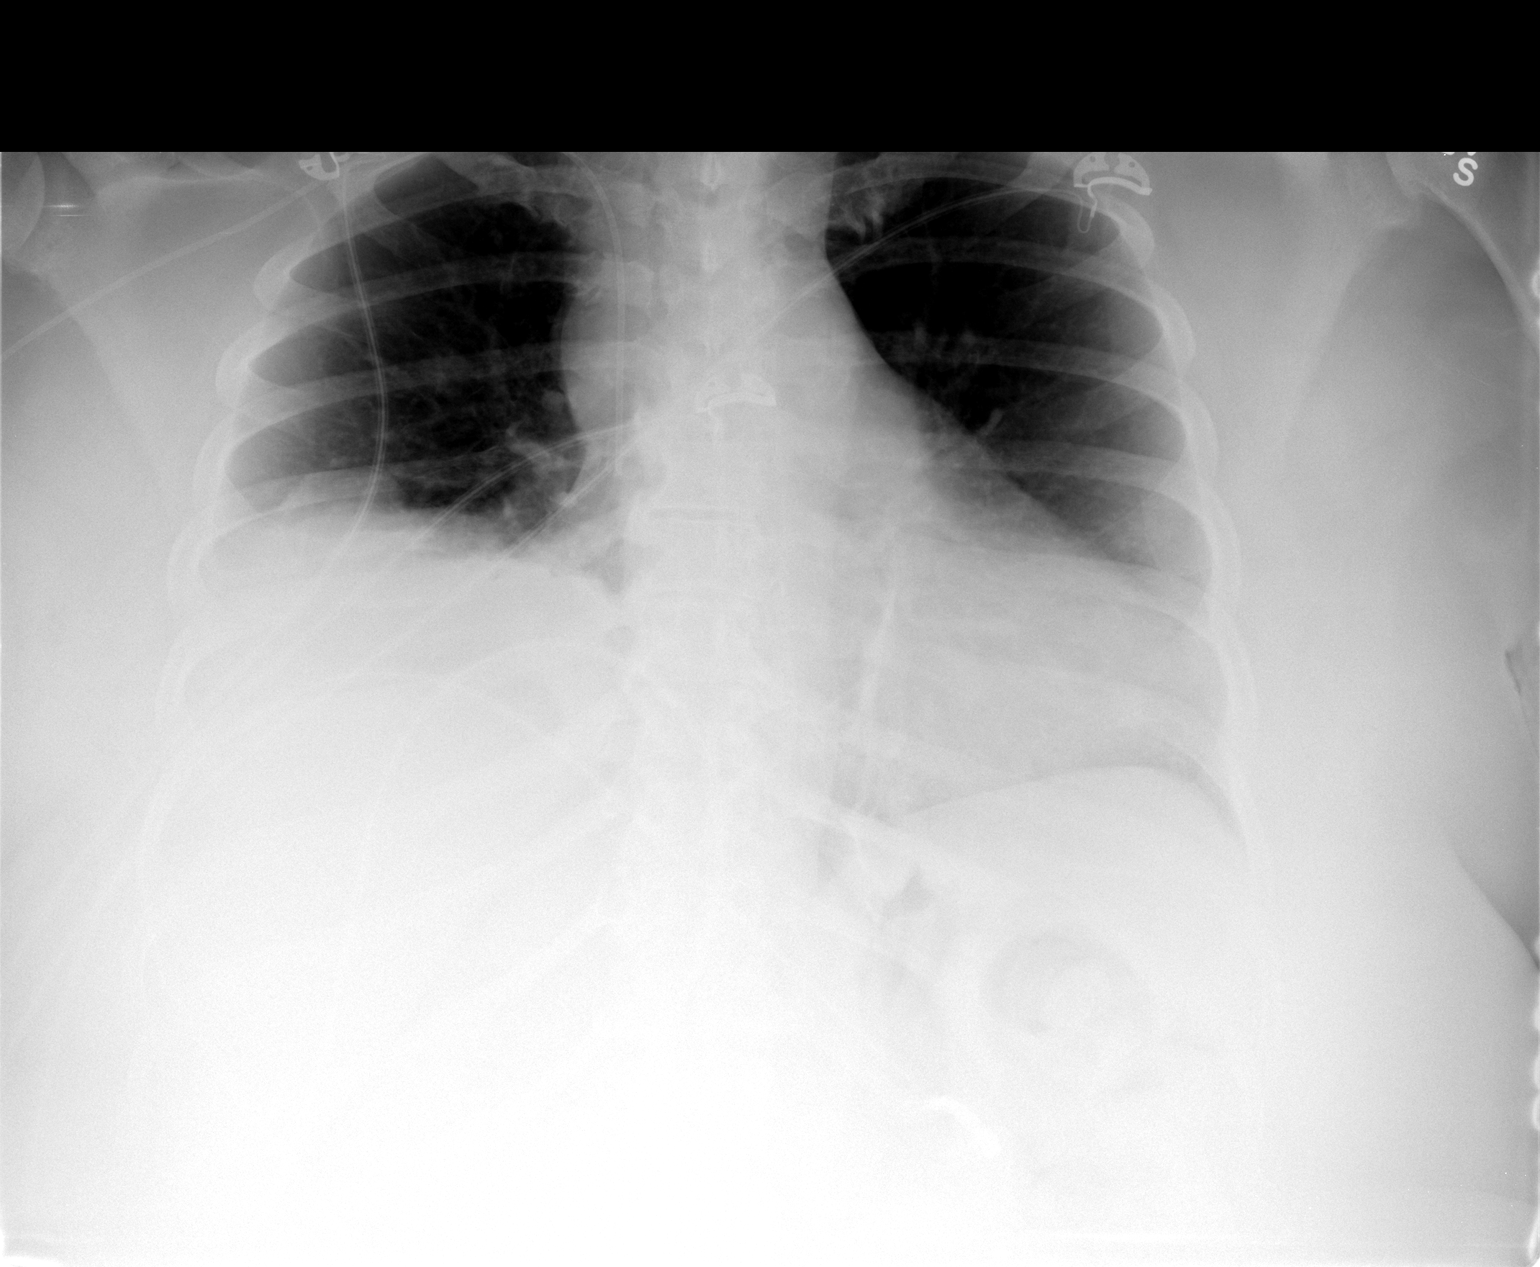

[2 of 2 positions shown; findings below may reference images not displayed]

FINDINGS: Stable right PICC. Stable volume loss at the right base associated
with atelectasis versus airspace disease. Mild linear atelectasis at
the left base. No pneumothorax. Increasing right peritracheal
density is of unknown significance.
IMPRESSION: Right basilar atelectasis versus airspace disease.

Increasing right paratracheal density which may represent collapsed
upper lobe. Attention on follow-up.

## 2015-03-12 IMAGING — CR DG CHEST 2V
2 series · 2 of 2 positions shown · non-contrast
Comparison: 10/23/2014.

CLINICAL DATA: Pneumonia.  Lung cancer.

EXAM:
CHEST  2 VIEW

[w chest pa]
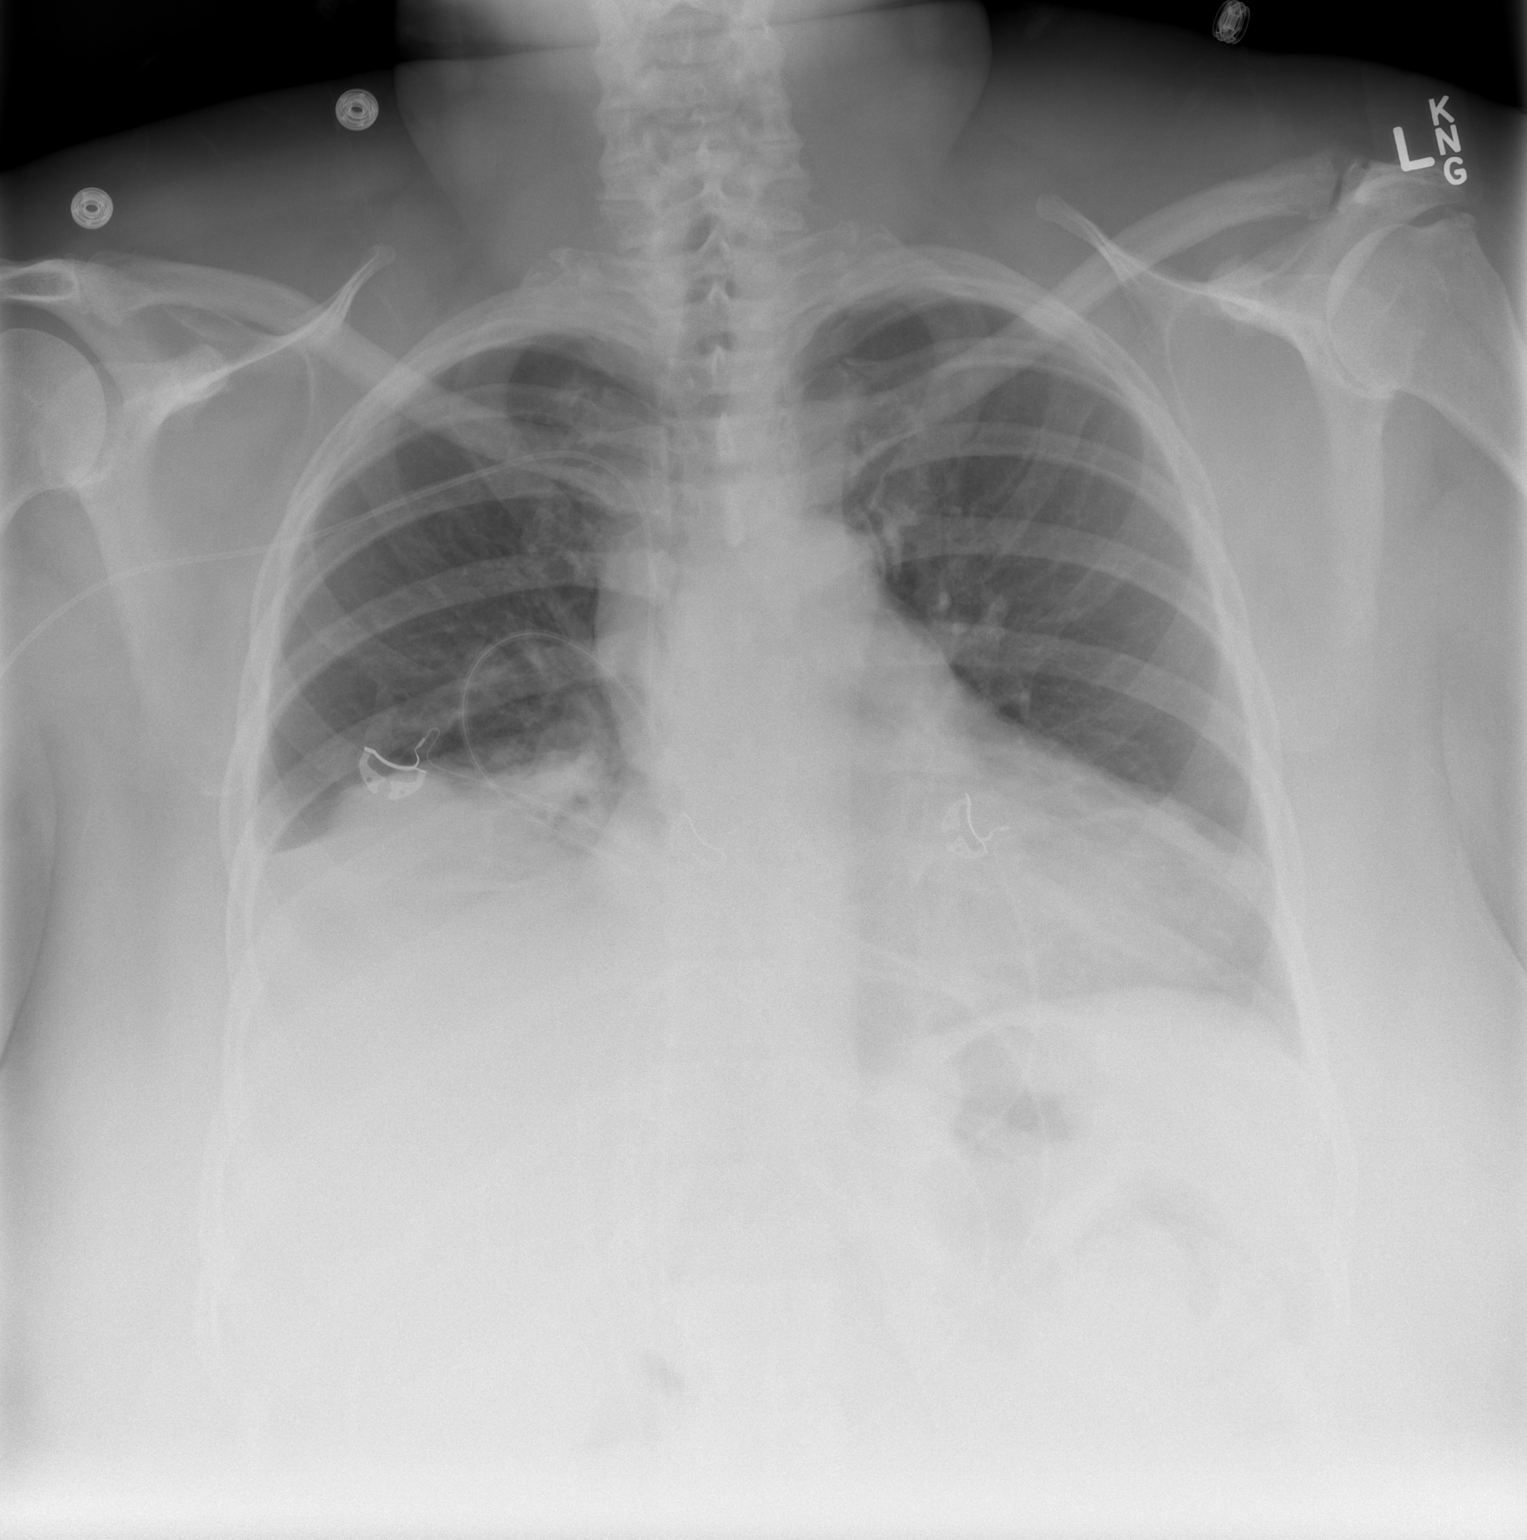

[w chest lat]
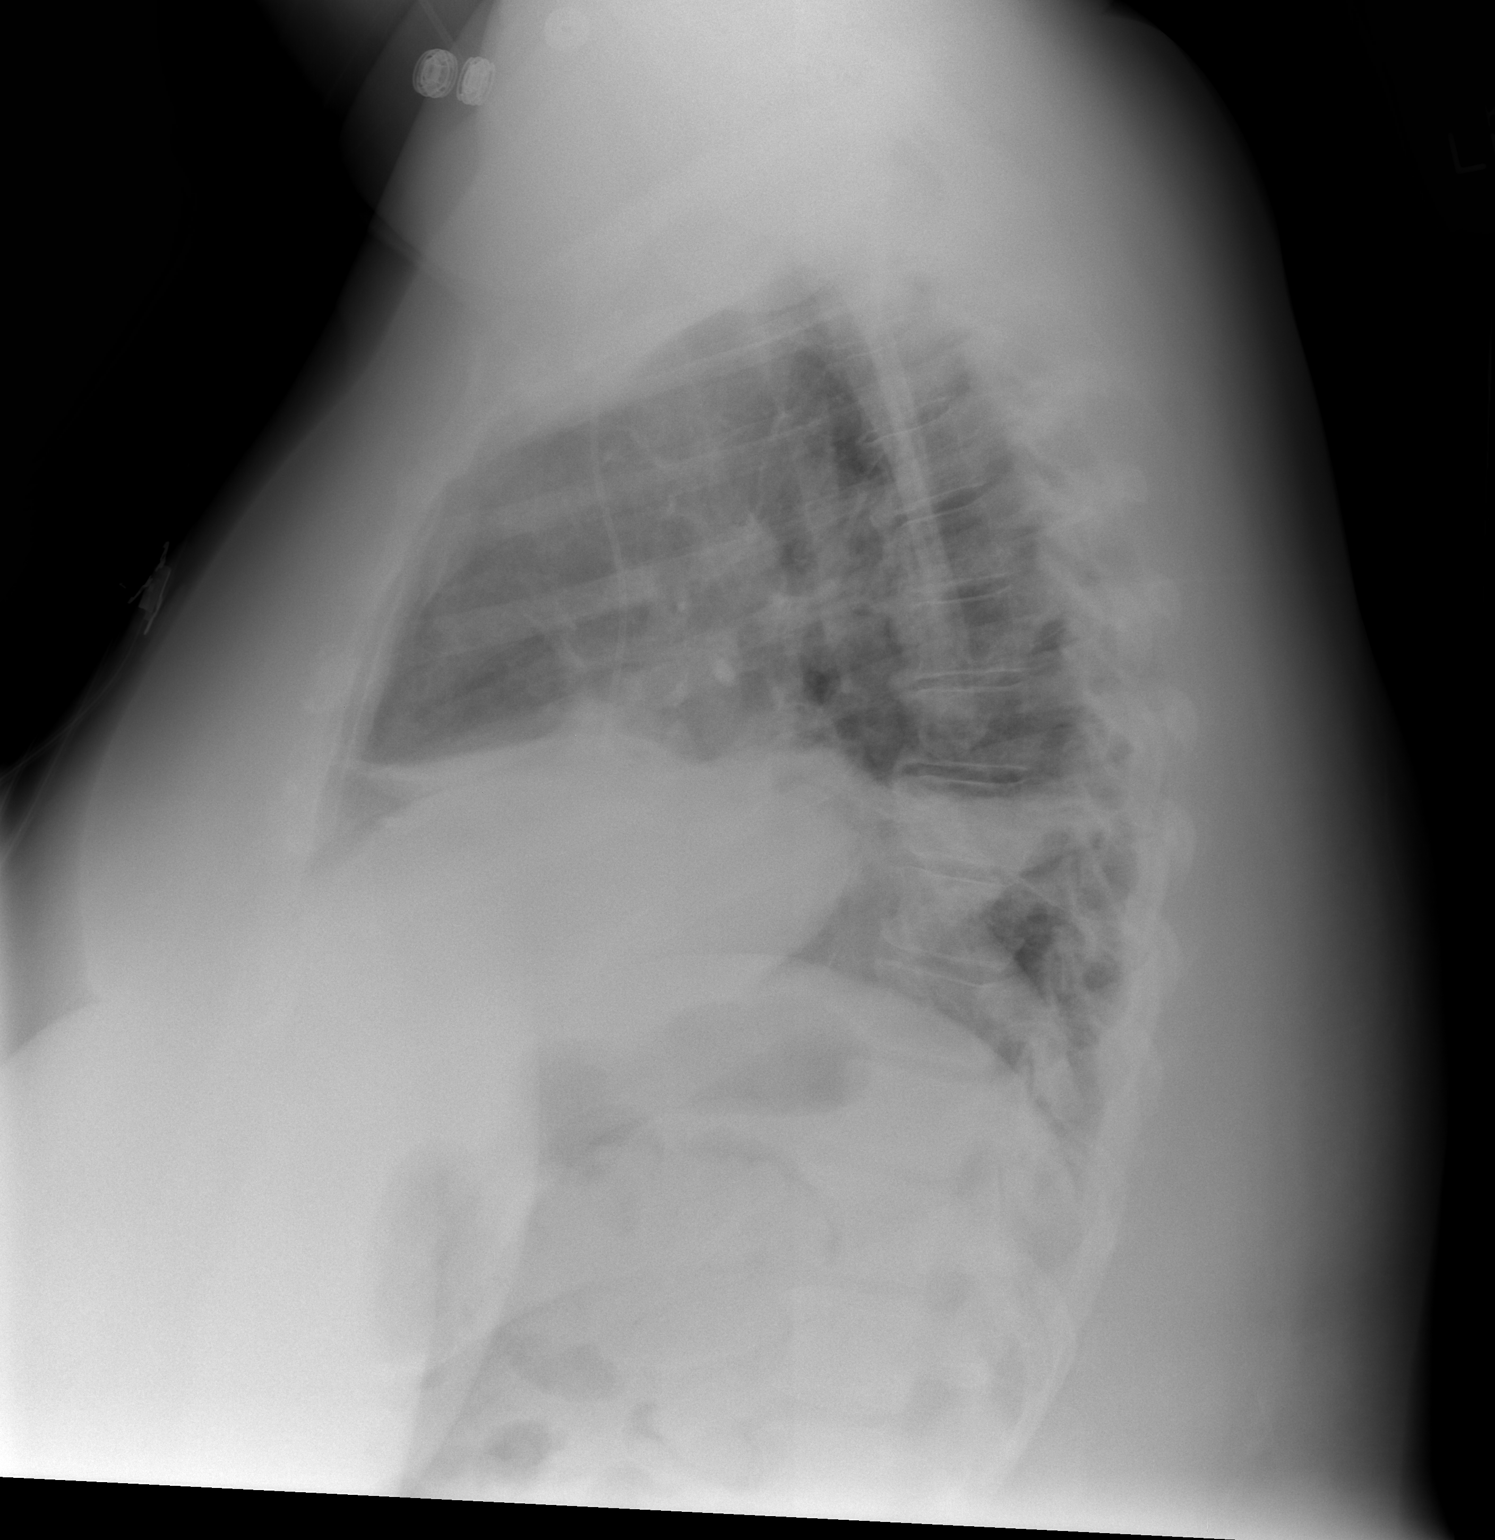

[2 of 2 positions shown; findings below may reference images not displayed]

FINDINGS: RIGHT upper extremity PICC is present with the tip in the lower SVC
at the level of the cavoatrial junction. Cardiomegaly. Elevation of
the RIGHT hemidiaphragm with consolidation of the RIGHT base. This
involves the RIGHT middle and RIGHT lower lobes. LEFT lung appears
clear. Monitoring leads project over the chest.

Compared to the most recent prior chest radiograph, there is little
if any interval change.

RIGHT peritracheal density probably represents mass effect from the
mediastinal mass demonstrated on prior CT 10/11/2014.

a
IMPRESSION: 1. Stable RIGHT upper extremity PICC.
2. RIGHT middle lobe and RIGHT lower lobe collapse/consolidation.

## 2016-05-30 ENCOUNTER — Other Ambulatory Visit: Payer: Self-pay | Admitting: Nurse Practitioner
